# Patient Record
Sex: Male | Born: 1950
Health system: Southern US, Community
[De-identification: ages and names within clinical notes are randomized; demographics above are authoritative.]

## PROBLEM LIST (undated history)

## (undated) DIAGNOSIS — C50919 Malignant neoplasm of unspecified site of unspecified female breast: Secondary | ICD-10-CM

## (undated) DIAGNOSIS — J930 Spontaneous tension pneumothorax: Secondary | ICD-10-CM

## (undated) DIAGNOSIS — C169 Malignant neoplasm of stomach, unspecified: Secondary | ICD-10-CM

## (undated) DIAGNOSIS — C801 Malignant (primary) neoplasm, unspecified: Secondary | ICD-10-CM

## (undated) DIAGNOSIS — E538 Deficiency of other specified B group vitamins: Secondary | ICD-10-CM

## (undated) DIAGNOSIS — H269 Unspecified cataract: Secondary | ICD-10-CM

## (undated) DIAGNOSIS — F32A Depression, unspecified: Secondary | ICD-10-CM

## (undated) DIAGNOSIS — F329 Major depressive disorder, single episode, unspecified: Secondary | ICD-10-CM

## (undated) DIAGNOSIS — D649 Anemia, unspecified: Secondary | ICD-10-CM

## (undated) HISTORY — DX: Deficiency of other specified B group vitamins: E53.8

## (undated) HISTORY — DX: Unspecified cataract: H26.9

## (undated) HISTORY — DX: Malignant neoplasm of unspecified site of unspecified female breast: C50.919

## (undated) HISTORY — DX: Malignant (primary) neoplasm, unspecified: C80.1

## (undated) HISTORY — DX: Depression, unspecified: F32.A

## (undated) HISTORY — DX: Spontaneous tension pneumothorax: J93.0

## (undated) HISTORY — DX: Malignant neoplasm of stomach, unspecified: C16.9

## (undated) HISTORY — DX: Anemia, unspecified: D64.9

## (undated) HISTORY — DX: Major depressive disorder, single episode, unspecified: F32.9

---

## 1990-10-22 DIAGNOSIS — J930 Spontaneous tension pneumothorax: Secondary | ICD-10-CM

## 1990-10-22 HISTORY — DX: Spontaneous tension pneumothorax: J93.0

## 2000-08-19 ENCOUNTER — Emergency Department (HOSPITAL_COMMUNITY): Admission: EM | Admit: 2000-08-19 | Discharge: 2000-08-19 | Payer: Self-pay | Admitting: Emergency Medicine

## 2000-08-19 ENCOUNTER — Encounter: Payer: Self-pay | Admitting: Emergency Medicine

## 2004-12-22 ENCOUNTER — Ambulatory Visit: Payer: Self-pay | Admitting: Family Medicine

## 2005-11-20 ENCOUNTER — Ambulatory Visit: Payer: Self-pay | Admitting: Family Medicine

## 2005-12-28 ENCOUNTER — Inpatient Hospital Stay (HOSPITAL_COMMUNITY): Admission: AD | Admit: 2005-12-28 | Discharge: 2006-01-07 | Payer: Self-pay | Admitting: Family Medicine

## 2005-12-28 ENCOUNTER — Ambulatory Visit: Payer: Self-pay | Admitting: Family Medicine

## 2005-12-28 ENCOUNTER — Ambulatory Visit: Payer: Self-pay | Admitting: Internal Medicine

## 2005-12-31 ENCOUNTER — Encounter (INDEPENDENT_AMBULATORY_CARE_PROVIDER_SITE_OTHER): Payer: Self-pay | Admitting: Specialist

## 2005-12-31 DIAGNOSIS — C169 Malignant neoplasm of stomach, unspecified: Secondary | ICD-10-CM

## 2005-12-31 HISTORY — PX: CHOLECYSTECTOMY: SHX55

## 2005-12-31 HISTORY — PX: BILROTH II PROCEDURE: SHX1232

## 2005-12-31 HISTORY — DX: Malignant neoplasm of stomach, unspecified: C16.9

## 2006-01-04 ENCOUNTER — Ambulatory Visit: Payer: Self-pay | Admitting: Oncology

## 2006-01-15 ENCOUNTER — Ambulatory Visit: Payer: Self-pay | Admitting: Family Medicine

## 2006-02-12 LAB — CBC WITH DIFFERENTIAL/PLATELET
BASO%: 1 % (ref 0.0–2.0)
Basophils Absolute: 0 10*3/uL (ref 0.0–0.1)
EOS%: 3.8 % (ref 0.0–7.0)
Eosinophils Absolute: 0.2 10*3/uL (ref 0.0–0.5)
HCT: 35.6 % — ABNORMAL LOW (ref 38.7–49.9)
HGB: 11.8 g/dL — ABNORMAL LOW (ref 13.0–17.1)
LYMPH%: 34.3 % (ref 14.0–48.0)
MCH: 26.8 pg — ABNORMAL LOW (ref 28.0–33.4)
MCHC: 33.3 g/dL (ref 32.0–35.9)
MCV: 80.7 fL — ABNORMAL LOW (ref 81.6–98.0)
MONO#: 0.5 10*3/uL (ref 0.1–0.9)
MONO%: 11 % (ref 0.0–13.0)
NEUT#: 2.2 10*3/uL (ref 1.5–6.5)
NEUT%: 49.9 % (ref 40.0–75.0)
Platelets: 238 10*3/uL (ref 145–400)
RBC: 4.41 10*6/uL (ref 4.20–5.71)
RDW: 14.9 % — ABNORMAL HIGH (ref 11.2–14.6)
WBC: 4.4 10*3/uL (ref 4.0–10.0)
lymph#: 1.5 10*3/uL (ref 0.9–3.3)

## 2006-02-12 LAB — COMPREHENSIVE METABOLIC PANEL
ALT: 10 U/L (ref 0–40)
AST: 15 U/L (ref 0–37)
Albumin: 4.5 g/dL (ref 3.5–5.2)
Alkaline Phosphatase: 19 U/L — ABNORMAL LOW (ref 39–117)
BUN: 13 mg/dL (ref 6–23)
CO2: 26 mEq/L (ref 19–32)
Calcium: 9.2 mg/dL (ref 8.4–10.5)
Chloride: 104 mEq/L (ref 96–112)
Creatinine, Ser: 0.9 mg/dL (ref 0.4–1.5)
Glucose, Bld: 104 mg/dL — ABNORMAL HIGH (ref 70–99)
Potassium: 3.9 mEq/L (ref 3.5–5.3)
Sodium: 140 mEq/L (ref 135–145)
Total Bilirubin: 1.6 mg/dL — ABNORMAL HIGH (ref 0.3–1.2)
Total Protein: 7.3 g/dL (ref 6.0–8.3)

## 2006-02-12 LAB — IRON AND TIBC
Iron: 62 ug/dL (ref 42–165)
UIBC: 326 ug/dL

## 2006-02-12 LAB — FERRITIN: Ferritin: 21 ng/mL — ABNORMAL LOW (ref 22–322)

## 2006-03-04 ENCOUNTER — Ambulatory Visit: Payer: Self-pay | Admitting: Family Medicine

## 2006-03-19 ENCOUNTER — Ambulatory Visit: Payer: Self-pay | Admitting: Oncology

## 2006-03-25 LAB — CBC WITH DIFFERENTIAL/PLATELET
BASO%: 0.9 % (ref 0.0–2.0)
EOS%: 2.3 % (ref 0.0–7.0)
LYMPH%: 45.8 % (ref 14.0–48.0)
MCHC: 32.9 g/dL (ref 32.0–35.9)
MONO#: 0.4 10*3/uL (ref 0.1–0.9)
Platelets: 212 10*3/uL (ref 145–400)
RBC: 4.49 10*6/uL (ref 4.20–5.71)
WBC: 4.1 10*3/uL (ref 4.0–10.0)
lymph#: 1.9 10*3/uL (ref 0.9–3.3)

## 2006-03-25 LAB — COMPREHENSIVE METABOLIC PANEL
AST: 19 U/L (ref 0–37)
Albumin: 4.3 g/dL (ref 3.5–5.2)
Alkaline Phosphatase: 18 U/L — ABNORMAL LOW (ref 39–117)
BUN: 10 mg/dL (ref 6–23)
Calcium: 9.3 mg/dL (ref 8.4–10.5)
Chloride: 106 mEq/L (ref 96–112)
Glucose, Bld: 96 mg/dL (ref 70–99)
Potassium: 4.3 mEq/L (ref 3.5–5.3)
Sodium: 143 mEq/L (ref 135–145)
Total Protein: 6.8 g/dL (ref 6.0–8.3)

## 2006-04-08 LAB — COMPREHENSIVE METABOLIC PANEL
ALT: 11 U/L (ref 0–40)
AST: 20 U/L (ref 0–37)
Albumin: 3.9 g/dL (ref 3.5–5.2)
Alkaline Phosphatase: 18 U/L — ABNORMAL LOW (ref 39–117)
Calcium: 8.7 mg/dL (ref 8.4–10.5)
Chloride: 103 mEq/L (ref 96–112)
Potassium: 3.7 mEq/L (ref 3.5–5.3)
Sodium: 141 mEq/L (ref 135–145)
Total Protein: 6.1 g/dL (ref 6.0–8.3)

## 2006-04-08 LAB — CBC WITH DIFFERENTIAL/PLATELET
Basophils Absolute: 0 10*3/uL (ref 0.0–0.1)
EOS%: 1.5 % (ref 0.0–7.0)
HGB: 11.3 g/dL — ABNORMAL LOW (ref 13.0–17.1)
MCH: 26.2 pg — ABNORMAL LOW (ref 28.0–33.4)
MCV: 78.8 fL — ABNORMAL LOW (ref 81.6–98.0)
MONO%: 8.7 % (ref 0.0–13.0)
NEUT#: 1.4 10*3/uL — ABNORMAL LOW (ref 1.5–6.5)
RBC: 4.31 10*6/uL (ref 4.20–5.71)
RDW: 17 % — ABNORMAL HIGH (ref 11.2–14.6)
lymph#: 1.8 10*3/uL (ref 0.9–3.3)

## 2006-05-06 ENCOUNTER — Ambulatory Visit (HOSPITAL_COMMUNITY): Admission: RE | Admit: 2006-05-06 | Discharge: 2006-05-06 | Payer: Self-pay | Admitting: Oncology

## 2006-05-08 ENCOUNTER — Ambulatory Visit: Payer: Self-pay | Admitting: Oncology

## 2006-05-08 LAB — CBC WITH DIFFERENTIAL/PLATELET
BASO%: 0.8 % (ref 0.0–2.0)
MCHC: 34 g/dL (ref 32.0–35.9)
MONO#: 0.4 10*3/uL (ref 0.1–0.9)
RBC: 4.03 10*6/uL — ABNORMAL LOW (ref 4.20–5.71)
WBC: 3.9 10*3/uL — ABNORMAL LOW (ref 4.0–10.0)
lymph#: 1.7 10*3/uL (ref 0.9–3.3)

## 2006-05-08 LAB — COMPREHENSIVE METABOLIC PANEL
ALT: 24 U/L (ref 0–40)
CO2: 29 mEq/L (ref 19–32)
Calcium: 8.6 mg/dL (ref 8.4–10.5)
Chloride: 104 mEq/L (ref 96–112)
Sodium: 141 mEq/L (ref 135–145)
Total Protein: 6 g/dL (ref 6.0–8.3)

## 2006-06-10 LAB — CBC WITH DIFFERENTIAL/PLATELET
Basophils Absolute: 0 10*3/uL (ref 0.0–0.1)
EOS%: 1.8 % (ref 0.0–7.0)
Eosinophils Absolute: 0.1 10*3/uL (ref 0.0–0.5)
HGB: 11 g/dL — ABNORMAL LOW (ref 13.0–17.1)
LYMPH%: 51.8 % — ABNORMAL HIGH (ref 14.0–48.0)
MCH: 28.4 pg (ref 28.0–33.4)
MCV: 83.5 fL (ref 81.6–98.0)
MONO%: 10.8 % (ref 0.0–13.0)
NEUT#: 1.3 10*3/uL — ABNORMAL LOW (ref 1.5–6.5)
NEUT%: 34.7 % — ABNORMAL LOW (ref 40.0–75.0)
Platelets: 172 10*3/uL (ref 145–400)

## 2006-06-10 LAB — COMPREHENSIVE METABOLIC PANEL
ALT: 135 U/L — ABNORMAL HIGH (ref 0–40)
CO2: 29 mEq/L (ref 19–32)
Creatinine, Ser: 1.11 mg/dL (ref 0.40–1.50)
Glucose, Bld: 142 mg/dL — ABNORMAL HIGH (ref 70–99)
Total Bilirubin: 1.8 mg/dL — ABNORMAL HIGH (ref 0.3–1.2)

## 2006-07-04 ENCOUNTER — Ambulatory Visit: Payer: Self-pay | Admitting: Oncology

## 2006-07-12 LAB — COMPREHENSIVE METABOLIC PANEL
ALT: 994 U/L — ABNORMAL HIGH (ref 0–40)
BUN: 12 mg/dL (ref 6–23)
CO2: 31 mEq/L (ref 19–32)
Calcium: 8.6 mg/dL (ref 8.4–10.5)
Chloride: 104 mEq/L (ref 96–112)
Creatinine, Ser: 0.85 mg/dL (ref 0.40–1.50)

## 2006-07-12 LAB — CBC WITH DIFFERENTIAL/PLATELET
Basophils Absolute: 0 10*3/uL (ref 0.0–0.1)
Eosinophils Absolute: 0 10*3/uL (ref 0.0–0.5)
HCT: 32.6 % — ABNORMAL LOW (ref 38.7–49.9)
LYMPH%: 35.5 % (ref 14.0–48.0)
MONO#: 0.7 10*3/uL (ref 0.1–0.9)
NEUT#: 1.5 10*3/uL (ref 1.5–6.5)
NEUT%: 43.7 % (ref 40.0–75.0)
Platelets: 185 10*3/uL (ref 145–400)
WBC: 3.5 10*3/uL — ABNORMAL LOW (ref 4.0–10.0)

## 2006-07-16 ENCOUNTER — Ambulatory Visit (HOSPITAL_COMMUNITY): Admission: RE | Admit: 2006-07-16 | Discharge: 2006-07-16 | Payer: Self-pay | Admitting: Oncology

## 2006-07-16 LAB — COMPREHENSIVE METABOLIC PANEL
ALT: 1178 U/L — ABNORMAL HIGH (ref 0–40)
CO2: 31 mEq/L (ref 19–32)
Chloride: 102 mEq/L (ref 96–112)
Sodium: 139 mEq/L (ref 135–145)
Total Bilirubin: 2.9 mg/dL — ABNORMAL HIGH (ref 0.3–1.2)
Total Protein: 6.1 g/dL (ref 6.0–8.3)

## 2006-07-17 LAB — COMPREHENSIVE METABOLIC PANEL
Alkaline Phosphatase: 64 U/L (ref 39–117)
Creatinine, Ser: 0.92 mg/dL (ref 0.40–1.50)
Glucose, Bld: 106 mg/dL — ABNORMAL HIGH (ref 70–99)
Sodium: 138 mEq/L (ref 135–145)
Total Bilirubin: 2.7 mg/dL — ABNORMAL HIGH (ref 0.3–1.2)
Total Protein: 5.1 g/dL — ABNORMAL LOW (ref 6.0–8.3)

## 2006-07-17 LAB — PROTIME-INR
INR: 1.4 — ABNORMAL LOW (ref 2.00–3.50)
Protime: 16.8 Seconds — ABNORMAL HIGH (ref 10.6–13.4)

## 2006-07-18 LAB — HEPATITIS B SURFACE ANTIGEN: Hepatitis B Surface Ag: NEGATIVE

## 2006-07-18 LAB — HEPATITIS B CORE ANTIBODY, IGM: Hep B C IgM: NEGATIVE

## 2006-07-19 ENCOUNTER — Ambulatory Visit (HOSPITAL_COMMUNITY): Admission: RE | Admit: 2006-07-19 | Discharge: 2006-07-19 | Payer: Self-pay | Admitting: Oncology

## 2006-07-19 ENCOUNTER — Ambulatory Visit: Payer: Self-pay | Admitting: Internal Medicine

## 2006-07-19 LAB — CBC WITH DIFFERENTIAL/PLATELET
BASO%: 0.5 % (ref 0.0–2.0)
Eosinophils Absolute: 0 10*3/uL (ref 0.0–0.5)
HCT: 37 % — ABNORMAL LOW (ref 38.7–49.9)
HGB: 12.3 g/dL — ABNORMAL LOW (ref 13.0–17.1)
MCHC: 33.2 g/dL (ref 32.0–35.9)
MONO#: 0.5 10*3/uL (ref 0.1–0.9)
NEUT#: 1.9 10*3/uL (ref 1.5–6.5)
NEUT%: 49.7 % (ref 40.0–75.0)
WBC: 3.8 10*3/uL — ABNORMAL LOW (ref 4.0–10.0)
lymph#: 1.4 10*3/uL (ref 0.9–3.3)

## 2006-07-19 LAB — COMPREHENSIVE METABOLIC PANEL
ALT: 1341 U/L — ABNORMAL HIGH (ref 0–40)
CO2: 32 mEq/L (ref 19–32)
Calcium: 9.1 mg/dL (ref 8.4–10.5)
Chloride: 102 mEq/L (ref 96–112)
Creatinine, Ser: 0.95 mg/dL (ref 0.40–1.50)
Total Protein: 6 g/dL (ref 6.0–8.3)

## 2006-07-19 LAB — PROTIME-INR

## 2006-07-22 LAB — CBC WITH DIFFERENTIAL/PLATELET
Eosinophils Absolute: 0 10*3/uL (ref 0.0–0.5)
MONO#: 0.7 10*3/uL (ref 0.1–0.9)
MONO%: 18.4 % — ABNORMAL HIGH (ref 0.0–13.0)
NEUT#: 2.3 10*3/uL (ref 1.5–6.5)
RBC: 3.57 10*6/uL — ABNORMAL LOW (ref 4.20–5.71)
RDW: 20 % — ABNORMAL HIGH (ref 11.2–14.6)
WBC: 4 10*3/uL (ref 4.0–10.0)

## 2006-07-22 LAB — COMPREHENSIVE METABOLIC PANEL
ALT: 1054 U/L — ABNORMAL HIGH (ref 0–40)
AST: 740 U/L — ABNORMAL HIGH (ref 0–37)
Albumin: 3 g/dL — ABNORMAL LOW (ref 3.5–5.2)
Alkaline Phosphatase: 72 U/L (ref 39–117)
BUN: 16 mg/dL (ref 6–23)
CO2: 32 mEq/L (ref 19–32)
Calcium: 9.1 mg/dL (ref 8.4–10.5)
Chloride: 101 mEq/L (ref 96–112)
Creatinine, Ser: 0.85 mg/dL (ref 0.40–1.50)
Glucose, Bld: 122 mg/dL — ABNORMAL HIGH (ref 70–99)
Potassium: 3.1 mEq/L — ABNORMAL LOW (ref 3.5–5.3)
Sodium: 139 mEq/L (ref 135–145)
Total Bilirubin: 5.4 mg/dL — ABNORMAL HIGH (ref 0.3–1.2)
Total Protein: 5.4 g/dL — ABNORMAL LOW (ref 6.0–8.3)

## 2006-07-22 LAB — PROTHROMBIN TIME: INR: 1.5 (ref 0.0–1.5)

## 2006-08-15 LAB — CBC WITH DIFFERENTIAL/PLATELET
BASO%: 1.9 % (ref 0.0–2.0)
EOS%: 0.2 % (ref 0.0–7.0)
HCT: 32.7 % — ABNORMAL LOW (ref 38.7–49.9)
LYMPH%: 23.9 % (ref 14.0–48.0)
MCH: 31.5 pg (ref 28.0–33.4)
MCHC: 34.4 g/dL (ref 32.0–35.9)
MCV: 91.7 fL (ref 81.6–98.0)
MONO%: 5.4 % (ref 0.0–13.0)
NEUT%: 68.6 % (ref 40.0–75.0)
Platelets: 179 10*3/uL (ref 145–400)
lymph#: 0.9 10*3/uL (ref 0.9–3.3)

## 2006-08-15 LAB — COMPREHENSIVE METABOLIC PANEL
ALT: 308 U/L — ABNORMAL HIGH (ref 0–40)
AST: 217 U/L — ABNORMAL HIGH (ref 0–37)
Alkaline Phosphatase: 71 U/L (ref 39–117)
Creatinine, Ser: 0.96 mg/dL (ref 0.40–1.50)
Total Bilirubin: 3.7 mg/dL — ABNORMAL HIGH (ref 0.3–1.2)

## 2006-08-19 ENCOUNTER — Ambulatory Visit: Payer: Self-pay | Admitting: Oncology

## 2006-08-20 LAB — COMPREHENSIVE METABOLIC PANEL
AST: 195 U/L — ABNORMAL HIGH (ref 0–37)
Alkaline Phosphatase: 84 U/L (ref 39–117)
BUN: 15 mg/dL (ref 6–23)
Glucose, Bld: 112 mg/dL — ABNORMAL HIGH (ref 70–99)
Potassium: 4 mEq/L (ref 3.5–5.3)
Sodium: 146 mEq/L — ABNORMAL HIGH (ref 135–145)
Total Bilirubin: 3.2 mg/dL — ABNORMAL HIGH (ref 0.3–1.2)

## 2006-08-20 LAB — CBC WITH DIFFERENTIAL/PLATELET
Basophils Absolute: 0 10*3/uL (ref 0.0–0.1)
EOS%: 0.7 % (ref 0.0–7.0)
Eosinophils Absolute: 0 10*3/uL (ref 0.0–0.5)
HGB: 12.7 g/dL — ABNORMAL LOW (ref 13.0–17.1)
MCV: 92.2 fL (ref 81.6–98.0)
MONO%: 11 % (ref 0.0–13.0)
NEUT#: 2.9 10*3/uL (ref 1.5–6.5)
RBC: 4.13 10*6/uL — ABNORMAL LOW (ref 4.20–5.71)
RDW: 20.1 % — ABNORMAL HIGH (ref 11.2–14.6)
lymph#: 2.3 10*3/uL (ref 0.9–3.3)

## 2006-08-20 LAB — PROTIME-INR
INR: 1.3 — ABNORMAL LOW (ref 2.00–3.50)
Protime: 15.6 Seconds — ABNORMAL HIGH (ref 10.6–13.4)

## 2006-08-21 ENCOUNTER — Ambulatory Visit: Admission: RE | Admit: 2006-08-21 | Discharge: 2006-08-21 | Payer: Self-pay | Admitting: Oncology

## 2006-08-21 ENCOUNTER — Encounter: Payer: Self-pay | Admitting: Vascular Surgery

## 2006-08-22 ENCOUNTER — Ambulatory Visit (HOSPITAL_COMMUNITY): Admission: RE | Admit: 2006-08-22 | Discharge: 2006-08-22 | Payer: Self-pay | Admitting: Oncology

## 2006-08-26 ENCOUNTER — Ambulatory Visit: Payer: Self-pay | Admitting: Internal Medicine

## 2006-09-30 ENCOUNTER — Ambulatory Visit: Payer: Self-pay | Admitting: Oncology

## 2006-09-30 LAB — COMPREHENSIVE METABOLIC PANEL
AST: 48 U/L — ABNORMAL HIGH (ref 0–37)
Albumin: 3.2 g/dL — ABNORMAL LOW (ref 3.5–5.2)
BUN: 12 mg/dL (ref 6–23)
Calcium: 8.5 mg/dL (ref 8.4–10.5)
Chloride: 106 mEq/L (ref 96–112)
Glucose, Bld: 162 mg/dL — ABNORMAL HIGH (ref 70–99)
Potassium: 3.8 mEq/L (ref 3.5–5.3)
Total Protein: 5.8 g/dL — ABNORMAL LOW (ref 6.0–8.3)

## 2006-09-30 LAB — CBC WITH DIFFERENTIAL/PLATELET
Basophils Absolute: 0.1 10*3/uL (ref 0.0–0.1)
Eosinophils Absolute: 0.2 10*3/uL (ref 0.0–0.5)
HGB: 11.2 g/dL — ABNORMAL LOW (ref 13.0–17.1)
NEUT#: 3.9 10*3/uL (ref 1.5–6.5)
RDW: 13.9 % (ref 11.2–14.6)
WBC: 7.2 10*3/uL (ref 4.0–10.0)
lymph#: 2.2 10*3/uL (ref 0.9–3.3)

## 2006-10-22 DIAGNOSIS — D649 Anemia, unspecified: Secondary | ICD-10-CM

## 2006-10-22 HISTORY — DX: Anemia, unspecified: D64.9

## 2006-11-12 LAB — COMPREHENSIVE METABOLIC PANEL
ALT: 20 U/L (ref 0–53)
AST: 20 U/L (ref 0–37)
Alkaline Phosphatase: 38 U/L — ABNORMAL LOW (ref 39–117)
Chloride: 106 mEq/L (ref 96–112)
Creatinine, Ser: 0.74 mg/dL (ref 0.40–1.50)
Total Bilirubin: 0.7 mg/dL (ref 0.3–1.2)

## 2006-11-12 LAB — CBC WITH DIFFERENTIAL/PLATELET
BASO%: 0.6 % (ref 0.0–2.0)
EOS%: 2.6 % (ref 0.0–7.0)
HCT: 34.1 % — ABNORMAL LOW (ref 38.7–49.9)
LYMPH%: 35.3 % (ref 14.0–48.0)
MCH: 29.1 pg (ref 28.0–33.4)
MCHC: 34 g/dL (ref 32.0–35.9)
MONO%: 12.1 % (ref 0.0–13.0)
NEUT%: 49.4 % (ref 40.0–75.0)
lymph#: 1.6 10*3/uL (ref 0.9–3.3)

## 2007-02-05 DIAGNOSIS — Z87891 Personal history of nicotine dependence: Secondary | ICD-10-CM | POA: Insufficient documentation

## 2007-02-05 DIAGNOSIS — K319 Disease of stomach and duodenum, unspecified: Secondary | ICD-10-CM | POA: Insufficient documentation

## 2007-02-05 DIAGNOSIS — K469 Unspecified abdominal hernia without obstruction or gangrene: Secondary | ICD-10-CM | POA: Insufficient documentation

## 2007-02-19 ENCOUNTER — Ambulatory Visit: Payer: Self-pay | Admitting: Family Medicine

## 2007-02-21 ENCOUNTER — Ambulatory Visit: Payer: Self-pay | Admitting: Oncology

## 2007-02-25 ENCOUNTER — Encounter: Payer: Self-pay | Admitting: Family Medicine

## 2007-02-25 ENCOUNTER — Ambulatory Visit (HOSPITAL_COMMUNITY): Admission: RE | Admit: 2007-02-25 | Discharge: 2007-02-25 | Payer: Self-pay | Admitting: Oncology

## 2007-02-25 LAB — COMPREHENSIVE METABOLIC PANEL
ALT: 22 U/L (ref 0–53)
AST: 31 U/L (ref 0–37)
BUN: 6 mg/dL (ref 6–23)
CO2: 31 mEq/L (ref 19–32)
Creatinine, Ser: 0.78 mg/dL (ref 0.40–1.50)
Total Bilirubin: 1.2 mg/dL (ref 0.3–1.2)

## 2007-02-25 LAB — CBC WITH DIFFERENTIAL/PLATELET
BASO%: 0.9 % (ref 0.0–2.0)
EOS%: 1.7 % (ref 0.0–7.0)
HCT: 33.2 % — ABNORMAL LOW (ref 38.7–49.9)
LYMPH%: 40.2 % (ref 14.0–48.0)
MCH: 25.7 pg — ABNORMAL LOW (ref 28.0–33.4)
MCHC: 33.3 g/dL (ref 32.0–35.9)
MCV: 77.1 fL — ABNORMAL LOW (ref 81.6–98.0)
NEUT%: 44.9 % (ref 40.0–75.0)
Platelets: 222 10*3/uL (ref 145–400)

## 2007-04-02 ENCOUNTER — Encounter: Payer: Self-pay | Admitting: Family Medicine

## 2007-04-21 ENCOUNTER — Ambulatory Visit (HOSPITAL_COMMUNITY): Admission: RE | Admit: 2007-04-21 | Discharge: 2007-04-21 | Payer: Self-pay | Admitting: Oncology

## 2007-06-05 ENCOUNTER — Ambulatory Visit: Payer: Self-pay | Admitting: Oncology

## 2007-06-09 LAB — CBC WITH DIFFERENTIAL/PLATELET
Basophils Absolute: 0.1 10*3/uL (ref 0.0–0.1)
Eosinophils Absolute: 0 10*3/uL (ref 0.0–0.5)
HGB: 10.1 g/dL — ABNORMAL LOW (ref 13.0–17.1)
LYMPH%: 37 % (ref 14.0–48.0)
MCV: 75 fL — ABNORMAL LOW (ref 81.6–98.0)
MONO%: 9.9 % (ref 0.0–13.0)
NEUT#: 2 10*3/uL (ref 1.5–6.5)
NEUT%: 50.5 % (ref 40.0–75.0)
Platelets: 234 10*3/uL (ref 145–400)
RBC: 4.02 10*6/uL — ABNORMAL LOW (ref 4.20–5.71)

## 2007-06-09 LAB — URINALYSIS, MICROSCOPIC - CHCC
Blood: NEGATIVE
Glucose: NEGATIVE g/dL
Leukocyte Esterase: NEGATIVE
Nitrite: NEGATIVE
Protein: NEGATIVE mg/dL
WBC, UA: NEGATIVE (ref 0–2)

## 2007-06-09 LAB — COMPREHENSIVE METABOLIC PANEL
Alkaline Phosphatase: 33 U/L — ABNORMAL LOW (ref 39–117)
BUN: 12 mg/dL (ref 6–23)
Glucose, Bld: 120 mg/dL — ABNORMAL HIGH (ref 70–99)
Total Bilirubin: 0.9 mg/dL (ref 0.3–1.2)

## 2007-06-10 LAB — IRON AND TIBC
TIBC: 453 ug/dL — ABNORMAL HIGH (ref 215–435)
UIBC: 402 ug/dL

## 2007-06-10 LAB — FERRITIN: Ferritin: 7 ng/mL — ABNORMAL LOW (ref 22–322)

## 2007-07-04 ENCOUNTER — Ambulatory Visit: Payer: Self-pay | Admitting: Internal Medicine

## 2007-07-11 ENCOUNTER — Encounter: Admission: RE | Admit: 2007-07-11 | Discharge: 2007-07-11 | Payer: Self-pay | Admitting: Urology

## 2007-07-21 ENCOUNTER — Ambulatory Visit: Payer: Self-pay | Admitting: Internal Medicine

## 2007-07-21 ENCOUNTER — Encounter: Payer: Self-pay | Admitting: Family Medicine

## 2007-08-23 DIAGNOSIS — C801 Malignant (primary) neoplasm, unspecified: Secondary | ICD-10-CM

## 2007-08-23 HISTORY — DX: Malignant (primary) neoplasm, unspecified: C80.1

## 2007-08-23 HISTORY — PX: PARTIAL NEPHRECTOMY: SHX414

## 2007-09-01 ENCOUNTER — Encounter: Payer: Self-pay | Admitting: Urology

## 2007-09-01 ENCOUNTER — Ambulatory Visit: Payer: Self-pay | Admitting: Oncology

## 2007-09-01 ENCOUNTER — Inpatient Hospital Stay (HOSPITAL_COMMUNITY): Admission: RE | Admit: 2007-09-01 | Discharge: 2007-09-03 | Payer: Self-pay | Admitting: Urology

## 2007-09-11 ENCOUNTER — Encounter: Payer: Self-pay | Admitting: Family Medicine

## 2007-09-26 ENCOUNTER — Encounter: Payer: Self-pay | Admitting: Family Medicine

## 2007-10-03 ENCOUNTER — Encounter: Payer: Self-pay | Admitting: Family Medicine

## 2007-10-03 LAB — CBC WITH DIFFERENTIAL/PLATELET
BASO%: 0.8 % (ref 0.0–2.0)
EOS%: 6.7 % (ref 0.0–7.0)
HCT: 31.6 % — ABNORMAL LOW (ref 38.7–49.9)
LYMPH%: 40 % (ref 14.0–48.0)
MCH: 25.4 pg — ABNORMAL LOW (ref 28.0–33.4)
MCHC: 33.3 g/dL (ref 32.0–35.9)
MCV: 76.2 fL — ABNORMAL LOW (ref 81.6–98.0)
MONO#: 0.5 10*3/uL (ref 0.1–0.9)
MONO%: 10.3 % (ref 0.0–13.0)
NEUT%: 42.2 % (ref 40.0–75.0)
Platelets: 183 10*3/uL (ref 145–400)
RBC: 4.16 10*6/uL — ABNORMAL LOW (ref 4.20–5.71)
WBC: 4.9 10*3/uL (ref 4.0–10.0)

## 2007-10-03 LAB — COMPREHENSIVE METABOLIC PANEL
ALT: 15 U/L (ref 0–53)
AST: 19 U/L (ref 0–37)
Alkaline Phosphatase: 32 U/L — ABNORMAL LOW (ref 39–117)
CO2: 29 mEq/L (ref 19–32)
Creatinine, Ser: 0.99 mg/dL (ref 0.40–1.50)
Sodium: 144 mEq/L (ref 135–145)
Total Bilirubin: 0.8 mg/dL (ref 0.3–1.2)
Total Protein: 7 g/dL (ref 6.0–8.3)

## 2007-10-03 LAB — FERRITIN: Ferritin: 21 ng/mL — ABNORMAL LOW (ref 22–322)

## 2007-10-03 LAB — IRON AND TIBC
%SAT: 9 % — ABNORMAL LOW (ref 20–55)
Iron: 39 ug/dL — ABNORMAL LOW (ref 42–165)

## 2007-10-14 ENCOUNTER — Encounter: Payer: Self-pay | Admitting: Family Medicine

## 2007-10-14 DIAGNOSIS — C649 Malignant neoplasm of unspecified kidney, except renal pelvis: Secondary | ICD-10-CM | POA: Insufficient documentation

## 2007-11-07 ENCOUNTER — Ambulatory Visit: Payer: Self-pay | Admitting: Oncology

## 2007-12-03 ENCOUNTER — Encounter: Payer: Self-pay | Admitting: Family Medicine

## 2008-01-13 DIAGNOSIS — J930 Spontaneous tension pneumothorax: Secondary | ICD-10-CM

## 2008-01-13 DIAGNOSIS — Z8659 Personal history of other mental and behavioral disorders: Secondary | ICD-10-CM

## 2008-01-13 DIAGNOSIS — K648 Other hemorrhoids: Secondary | ICD-10-CM | POA: Insufficient documentation

## 2008-01-13 DIAGNOSIS — J939 Pneumothorax, unspecified: Secondary | ICD-10-CM | POA: Insufficient documentation

## 2008-01-13 DIAGNOSIS — Z862 Personal history of diseases of the blood and blood-forming organs and certain disorders involving the immune mechanism: Secondary | ICD-10-CM

## 2008-01-13 DIAGNOSIS — D649 Anemia, unspecified: Secondary | ICD-10-CM

## 2008-01-13 DIAGNOSIS — Z8639 Personal history of other endocrine, nutritional and metabolic disease: Secondary | ICD-10-CM | POA: Insufficient documentation

## 2008-01-13 DIAGNOSIS — K644 Residual hemorrhoidal skin tags: Secondary | ICD-10-CM | POA: Insufficient documentation

## 2008-01-28 ENCOUNTER — Ambulatory Visit: Payer: Self-pay | Admitting: Oncology

## 2008-02-02 ENCOUNTER — Encounter: Payer: Self-pay | Admitting: Internal Medicine

## 2008-02-02 LAB — COMPREHENSIVE METABOLIC PANEL
Albumin: 4.3 g/dL (ref 3.5–5.2)
BUN: 9 mg/dL (ref 6–23)
CO2: 27 mEq/L (ref 19–32)
Calcium: 9 mg/dL (ref 8.4–10.5)
Chloride: 103 mEq/L (ref 96–112)
Glucose, Bld: 79 mg/dL (ref 70–99)
Potassium: 3.4 mEq/L — ABNORMAL LOW (ref 3.5–5.3)

## 2008-02-02 LAB — CBC WITH DIFFERENTIAL/PLATELET
Basophils Absolute: 0 10*3/uL (ref 0.0–0.1)
Eosinophils Absolute: 0.1 10*3/uL (ref 0.0–0.5)
HGB: 13.9 g/dL (ref 13.0–17.1)
MCV: 82.9 fL (ref 81.6–98.0)
MONO%: 8.1 % (ref 0.0–13.0)
NEUT#: 2.1 10*3/uL (ref 1.5–6.5)
RDW: 19 % — ABNORMAL HIGH (ref 11.2–14.6)
lymph#: 2.1 10*3/uL (ref 0.9–3.3)

## 2008-02-16 ENCOUNTER — Telehealth (INDEPENDENT_AMBULATORY_CARE_PROVIDER_SITE_OTHER): Payer: Self-pay | Admitting: *Deleted

## 2008-04-02 ENCOUNTER — Telehealth (INDEPENDENT_AMBULATORY_CARE_PROVIDER_SITE_OTHER): Payer: Self-pay | Admitting: *Deleted

## 2008-04-08 ENCOUNTER — Ambulatory Visit: Payer: Self-pay | Admitting: Family Medicine

## 2008-05-31 ENCOUNTER — Ambulatory Visit: Payer: Self-pay | Admitting: Oncology

## 2008-07-16 ENCOUNTER — Ambulatory Visit: Payer: Self-pay | Admitting: Oncology

## 2008-07-20 ENCOUNTER — Encounter: Payer: Self-pay | Admitting: Family Medicine

## 2008-07-20 LAB — COMPREHENSIVE METABOLIC PANEL WITH GFR
ALT: 16 U/L (ref 0–53)
AST: 15 U/L (ref 0–37)
Albumin: 4.5 g/dL (ref 3.5–5.2)
Alkaline Phosphatase: 31 U/L — ABNORMAL LOW (ref 39–117)
BUN: 6 mg/dL (ref 6–23)
CO2: 29 meq/L (ref 19–32)
Calcium: 9.3 mg/dL (ref 8.4–10.5)
Chloride: 101 meq/L (ref 96–112)
Creatinine, Ser: 0.83 mg/dL (ref 0.40–1.50)
Glucose, Bld: 90 mg/dL (ref 70–99)
Potassium: 3.7 meq/L (ref 3.5–5.3)
Sodium: 142 meq/L (ref 135–145)
Total Bilirubin: 1.6 mg/dL — ABNORMAL HIGH (ref 0.3–1.2)
Total Protein: 7.3 g/dL (ref 6.0–8.3)

## 2008-07-20 LAB — IRON AND TIBC: TIBC: 375 ug/dL (ref 215–435)

## 2008-07-20 LAB — CBC WITH DIFFERENTIAL/PLATELET
BASO%: 1.4 % (ref 0.0–2.0)
Basophils Absolute: 0.1 10e3/uL (ref 0.0–0.1)
EOS%: 1.1 % (ref 0.0–7.0)
Eosinophils Absolute: 0 10e3/uL (ref 0.0–0.5)
HCT: 41.6 % (ref 38.7–49.9)
HGB: 14.4 g/dL (ref 13.0–17.1)
LYMPH%: 31.3 % (ref 14.0–48.0)
MCH: 29.4 pg (ref 28.0–33.4)
MCHC: 34.7 g/dL (ref 32.0–35.9)
MCV: 84.7 fL (ref 81.6–98.0)
MONO#: 0.4 10e3/uL (ref 0.1–0.9)
MONO%: 9.2 % (ref 0.0–13.0)
NEUT#: 2.5 10e3/uL (ref 1.5–6.5)
NEUT%: 57 % (ref 40.0–75.0)
Platelets: 194 10e3/uL (ref 145–400)
RBC: 4.91 10e6/uL (ref 4.20–5.71)
RDW: 12 % (ref 11.2–14.6)
WBC: 4.4 10e3/uL (ref 4.0–10.0)
lymph#: 1.4 10e3/uL (ref 0.9–3.3)

## 2008-07-20 LAB — FERRITIN: Ferritin: 62 ng/mL (ref 22–322)

## 2009-01-12 ENCOUNTER — Ambulatory Visit: Payer: Self-pay | Admitting: Oncology

## 2009-02-15 ENCOUNTER — Encounter: Payer: Self-pay | Admitting: Family Medicine

## 2009-02-15 LAB — CBC WITH DIFFERENTIAL/PLATELET
Basophils Absolute: 0 10*3/uL (ref 0.0–0.1)
Eosinophils Absolute: 0.1 10*3/uL (ref 0.0–0.5)
HCT: 41.3 % (ref 38.4–49.9)
HGB: 14 g/dL (ref 13.0–17.1)
LYMPH%: 32.9 % (ref 14.0–49.0)
MCHC: 33.8 g/dL (ref 32.0–36.0)
MONO#: 0.4 10*3/uL (ref 0.1–0.9)
NEUT#: 2.5 10*3/uL (ref 1.5–6.5)
NEUT%: 55.6 % (ref 39.0–75.0)
Platelets: 172 10*3/uL (ref 140–400)
WBC: 4.4 10*3/uL (ref 4.0–10.3)
lymph#: 1.5 10*3/uL (ref 0.9–3.3)

## 2009-02-15 LAB — COMPREHENSIVE METABOLIC PANEL
ALT: 19 U/L (ref 0–53)
CO2: 30 mEq/L (ref 19–32)
Calcium: 8.9 mg/dL (ref 8.4–10.5)
Chloride: 103 mEq/L (ref 96–112)
Creatinine, Ser: 0.82 mg/dL (ref 0.40–1.50)
Glucose, Bld: 143 mg/dL — ABNORMAL HIGH (ref 70–99)
Total Bilirubin: 1.1 mg/dL (ref 0.3–1.2)

## 2009-06-02 ENCOUNTER — Ambulatory Visit: Payer: Self-pay | Admitting: Family Medicine

## 2009-06-02 DIAGNOSIS — C169 Malignant neoplasm of stomach, unspecified: Secondary | ICD-10-CM | POA: Insufficient documentation

## 2009-10-17 ENCOUNTER — Ambulatory Visit: Payer: Self-pay | Admitting: Oncology

## 2009-10-17 LAB — CBC WITH DIFFERENTIAL/PLATELET
Eosinophils Absolute: 0.1 10*3/uL (ref 0.0–0.5)
LYMPH%: 40.8 % (ref 14.0–49.0)
MCV: 89.6 fL (ref 79.3–98.0)
MONO%: 8 % (ref 0.0–14.0)
NEUT#: 2.3 10*3/uL (ref 1.5–6.5)
Platelets: 199 10*3/uL (ref 140–400)
RBC: 4.71 10*6/uL (ref 4.20–5.82)

## 2009-10-17 LAB — COMPREHENSIVE METABOLIC PANEL
ALT: 18 U/L (ref 0–53)
BUN: 7 mg/dL (ref 6–23)
CO2: 33 mEq/L — ABNORMAL HIGH (ref 19–32)
Calcium: 8.7 mg/dL (ref 8.4–10.5)
Creatinine, Ser: 0.8 mg/dL (ref 0.40–1.50)
Total Bilirubin: 1.3 mg/dL — ABNORMAL HIGH (ref 0.3–1.2)

## 2009-10-18 ENCOUNTER — Ambulatory Visit (HOSPITAL_COMMUNITY): Admission: RE | Admit: 2009-10-18 | Discharge: 2009-10-18 | Payer: Self-pay | Admitting: Oncology

## 2009-10-18 ENCOUNTER — Encounter (INDEPENDENT_AMBULATORY_CARE_PROVIDER_SITE_OTHER): Payer: Self-pay | Admitting: *Deleted

## 2009-10-25 ENCOUNTER — Encounter: Payer: Self-pay | Admitting: Internal Medicine

## 2009-10-26 ENCOUNTER — Telehealth (INDEPENDENT_AMBULATORY_CARE_PROVIDER_SITE_OTHER): Payer: Self-pay | Admitting: *Deleted

## 2009-10-26 ENCOUNTER — Encounter: Payer: Self-pay | Admitting: Gastroenterology

## 2009-11-03 ENCOUNTER — Ambulatory Visit (HOSPITAL_COMMUNITY): Admission: RE | Admit: 2009-11-03 | Discharge: 2009-11-03 | Payer: Self-pay | Admitting: Gastroenterology

## 2009-11-03 ENCOUNTER — Ambulatory Visit: Payer: Self-pay | Admitting: Gastroenterology

## 2009-11-07 ENCOUNTER — Telehealth: Payer: Self-pay | Admitting: Gastroenterology

## 2009-11-11 ENCOUNTER — Ambulatory Visit (HOSPITAL_COMMUNITY): Admission: RE | Admit: 2009-11-11 | Discharge: 2009-11-11 | Payer: Self-pay | Admitting: Oncology

## 2009-12-20 HISTORY — PX: TUMOR REMOVAL: SHX12

## 2010-01-02 ENCOUNTER — Encounter: Payer: Self-pay | Admitting: Family Medicine

## 2010-01-05 ENCOUNTER — Encounter: Payer: Self-pay | Admitting: Family Medicine

## 2010-02-01 ENCOUNTER — Encounter: Payer: Self-pay | Admitting: Family Medicine

## 2010-02-13 ENCOUNTER — Ambulatory Visit: Payer: Self-pay | Admitting: Oncology

## 2010-02-14 ENCOUNTER — Encounter: Payer: Self-pay | Admitting: Internal Medicine

## 2010-02-14 LAB — COMPREHENSIVE METABOLIC PANEL
Albumin: 4.1 g/dL (ref 3.5–5.2)
BUN: 12 mg/dL (ref 6–23)
CO2: 28 mEq/L (ref 19–32)
Calcium: 9 mg/dL (ref 8.4–10.5)
Chloride: 104 mEq/L (ref 96–112)
Glucose, Bld: 111 mg/dL — ABNORMAL HIGH (ref 70–99)
Potassium: 4.3 mEq/L (ref 3.5–5.3)
Sodium: 141 mEq/L (ref 135–145)
Total Protein: 6.7 g/dL (ref 6.0–8.3)

## 2010-02-14 LAB — CBC WITH DIFFERENTIAL/PLATELET
Basophils Absolute: 0.1 10*3/uL (ref 0.0–0.1)
Eosinophils Absolute: 0.2 10*3/uL (ref 0.0–0.5)
HGB: 11.7 g/dL — ABNORMAL LOW (ref 13.0–17.1)
MCV: 87.5 fL (ref 79.3–98.0)
MONO#: 0.8 10*3/uL (ref 0.1–0.9)
MONO%: 12.8 % (ref 0.0–14.0)
NEUT#: 2.8 10*3/uL (ref 1.5–6.5)
RBC: 4.01 10*6/uL — ABNORMAL LOW (ref 4.20–5.82)
RDW: 13.2 % (ref 11.0–14.6)
WBC: 5.9 10*3/uL (ref 4.0–10.3)
lymph#: 2.1 10*3/uL (ref 0.9–3.3)

## 2010-04-19 ENCOUNTER — Ambulatory Visit: Payer: Self-pay | Admitting: Oncology

## 2010-04-19 LAB — COMPREHENSIVE METABOLIC PANEL
AST: 16 U/L (ref 0–37)
BUN: 16 mg/dL (ref 6–23)
Calcium: 8.6 mg/dL (ref 8.4–10.5)
Chloride: 109 mEq/L (ref 96–112)
Creatinine, Ser: 0.86 mg/dL (ref 0.40–1.50)
Glucose, Bld: 226 mg/dL — ABNORMAL HIGH (ref 70–99)

## 2010-04-19 LAB — CBC WITH DIFFERENTIAL/PLATELET
Basophils Absolute: 0.1 10*3/uL (ref 0.0–0.1)
EOS%: 2.4 % (ref 0.0–7.0)
Eosinophils Absolute: 0.1 10*3/uL (ref 0.0–0.5)
HCT: 31.5 % — ABNORMAL LOW (ref 38.4–49.9)
HGB: 9.9 g/dL — ABNORMAL LOW (ref 13.0–17.1)
MCH: 25.6 pg — ABNORMAL LOW (ref 27.2–33.4)
MCV: 81.4 fL (ref 79.3–98.0)
MONO%: 9.6 % (ref 0.0–14.0)
NEUT%: 53.5 % (ref 39.0–75.0)

## 2010-04-28 LAB — COMPREHENSIVE METABOLIC PANEL
ALT: 24 U/L (ref 0–53)
AST: 27 U/L (ref 0–37)
Albumin: 3.9 g/dL (ref 3.5–5.2)
Alkaline Phosphatase: 37 U/L — ABNORMAL LOW (ref 39–117)
BUN: 18 mg/dL (ref 6–23)
CO2: 23 mEq/L (ref 19–32)
Chloride: 104 mEq/L (ref 96–112)
Glucose, Bld: 154 mg/dL — ABNORMAL HIGH (ref 70–99)
Potassium: 4 mEq/L (ref 3.5–5.3)
Sodium: 140 mEq/L (ref 135–145)
Total Protein: 6.6 g/dL (ref 6.0–8.3)

## 2010-04-28 LAB — CBC WITH DIFFERENTIAL/PLATELET
BASO%: 1.2 % (ref 0.0–2.0)
Basophils Absolute: 0.1 10*3/uL (ref 0.0–0.1)
EOS%: 5.2 % (ref 0.0–7.0)
HGB: 10.7 g/dL — ABNORMAL LOW (ref 13.0–17.1)
MCH: 25.6 pg — ABNORMAL LOW (ref 27.2–33.4)
MCHC: 31.4 g/dL — ABNORMAL LOW (ref 32.0–36.0)
MCV: 81.4 fL (ref 79.3–98.0)
RBC: 4.19 10*6/uL — ABNORMAL LOW (ref 4.20–5.82)
RDW: 16.3 % — ABNORMAL HIGH (ref 11.0–14.6)
WBC: 5.7 10*3/uL (ref 4.0–10.3)

## 2010-05-05 ENCOUNTER — Encounter: Payer: Self-pay | Admitting: Family Medicine

## 2010-05-05 LAB — CBC WITH DIFFERENTIAL/PLATELET
BASO%: 0.1 % (ref 0.0–2.0)
EOS%: 1.4 % (ref 0.0–7.0)
LYMPH%: 27.4 % (ref 14.0–49.0)
MCH: 26.7 pg — ABNORMAL LOW (ref 27.2–33.4)
MCHC: 33.1 g/dL (ref 32.0–36.0)
MONO#: 1.3 10*3/uL — ABNORMAL HIGH (ref 0.1–0.9)
NEUT%: 52.6 % (ref 39.0–75.0)
RBC: 3.79 10*6/uL — ABNORMAL LOW (ref 4.20–5.82)
WBC: 7.3 10*3/uL (ref 4.0–10.3)
lymph#: 2 10*3/uL (ref 0.9–3.3)

## 2010-05-05 LAB — COMPREHENSIVE METABOLIC PANEL
ALT: 346 U/L — ABNORMAL HIGH (ref 0–53)
AST: 218 U/L — ABNORMAL HIGH (ref 0–37)
CO2: 26 mEq/L (ref 19–32)
Chloride: 105 mEq/L (ref 96–112)
Creatinine, Ser: 0.89 mg/dL (ref 0.40–1.50)
Sodium: 140 mEq/L (ref 135–145)
Total Bilirubin: 1.4 mg/dL — ABNORMAL HIGH (ref 0.3–1.2)
Total Protein: 6.5 g/dL (ref 6.0–8.3)

## 2010-05-05 LAB — FERRITIN: Ferritin: 20 ng/mL — ABNORMAL LOW (ref 22–322)

## 2010-05-05 LAB — IRON AND TIBC
%SAT: 18 % — ABNORMAL LOW (ref 20–55)
Iron: 82 ug/dL (ref 42–165)

## 2010-05-12 LAB — CBC WITH DIFFERENTIAL/PLATELET
BASO%: 0 % (ref 0.0–2.0)
Basophils Absolute: 0 10*3/uL (ref 0.0–0.1)
EOS%: 0.9 % (ref 0.0–7.0)
Eosinophils Absolute: 0.1 10*3/uL (ref 0.0–0.5)
HCT: 32.6 % — ABNORMAL LOW (ref 38.4–49.9)
HGB: 10.7 g/dL — ABNORMAL LOW (ref 13.0–17.1)
LYMPH%: 18 % (ref 14.0–49.0)
MCH: 26.6 pg — ABNORMAL LOW (ref 27.2–33.4)
MCHC: 32.9 g/dL (ref 32.0–36.0)
MCV: 80.8 fL (ref 79.3–98.0)
MONO#: 1.8 10*3/uL — ABNORMAL HIGH (ref 0.1–0.9)
MONO%: 15.1 % — ABNORMAL HIGH (ref 0.0–14.0)
NEUT#: 7.8 10*3/uL — ABNORMAL HIGH (ref 1.5–6.5)
NEUT%: 66 % (ref 39.0–75.0)
Platelets: 365 10*3/uL (ref 140–400)
RBC: 4.04 10*6/uL — ABNORMAL LOW (ref 4.20–5.82)
RDW: 18.7 % — ABNORMAL HIGH (ref 11.0–14.6)
WBC: 11.9 10*3/uL — ABNORMAL HIGH (ref 4.0–10.3)
lymph#: 2.1 10*3/uL (ref 0.9–3.3)

## 2010-05-12 LAB — COMPREHENSIVE METABOLIC PANEL WITH GFR
ALT: 614 U/L — ABNORMAL HIGH (ref 0–53)
AST: 404 U/L — ABNORMAL HIGH (ref 0–37)
Albumin: 3.6 g/dL (ref 3.5–5.2)
Alkaline Phosphatase: 45 U/L (ref 39–117)
BUN: 7 mg/dL (ref 6–23)
CO2: 28 meq/L (ref 19–32)
Calcium: 8.7 mg/dL (ref 8.4–10.5)
Chloride: 102 meq/L (ref 96–112)
Creatinine, Ser: 0.88 mg/dL (ref 0.40–1.50)
Glucose, Bld: 148 mg/dL — ABNORMAL HIGH (ref 70–99)
Potassium: 3.9 meq/L (ref 3.5–5.3)
Sodium: 138 meq/L (ref 135–145)
Total Bilirubin: 1.1 mg/dL (ref 0.3–1.2)
Total Protein: 6.4 g/dL (ref 6.0–8.3)

## 2010-05-15 LAB — COMPREHENSIVE METABOLIC PANEL
ALT: 771 U/L — ABNORMAL HIGH (ref 0–53)
AST: 555 U/L — ABNORMAL HIGH (ref 0–37)
CO2: 26 mEq/L (ref 19–32)
Calcium: 8.4 mg/dL (ref 8.4–10.5)
Chloride: 108 mEq/L (ref 96–112)
Creatinine, Ser: 0.81 mg/dL (ref 0.40–1.50)
Potassium: 3.9 mEq/L (ref 3.5–5.3)
Sodium: 140 mEq/L (ref 135–145)
Total Protein: 6.1 g/dL (ref 6.0–8.3)

## 2010-05-18 ENCOUNTER — Encounter: Payer: Self-pay | Admitting: Family Medicine

## 2010-05-18 LAB — COMPREHENSIVE METABOLIC PANEL
ALT: 1180 U/L — ABNORMAL HIGH (ref 0–53)
Alkaline Phosphatase: 56 U/L (ref 39–117)
CO2: 31 mEq/L (ref 19–32)
Creatinine, Ser: 0.97 mg/dL (ref 0.40–1.50)
Glucose, Bld: 119 mg/dL — ABNORMAL HIGH (ref 70–99)
Total Bilirubin: 1.7 mg/dL — ABNORMAL HIGH (ref 0.3–1.2)

## 2010-05-19 ENCOUNTER — Ambulatory Visit: Payer: Self-pay | Admitting: Oncology

## 2010-05-19 ENCOUNTER — Encounter: Payer: Self-pay | Admitting: Family Medicine

## 2010-05-19 LAB — CBC WITH DIFFERENTIAL/PLATELET
Eosinophils Absolute: 0 10*3/uL (ref 0.0–0.5)
HGB: 11.7 g/dL — ABNORMAL LOW (ref 13.0–17.1)
MONO#: 0.7 10*3/uL (ref 0.1–0.9)
NEUT#: 8.8 10*3/uL — ABNORMAL HIGH (ref 1.5–6.5)
RBC: 4.55 10*6/uL (ref 4.20–5.82)
RDW: 18.9 % — ABNORMAL HIGH (ref 11.0–14.6)
WBC: 10.9 10*3/uL — ABNORMAL HIGH (ref 4.0–10.3)
nRBC: 0 % (ref 0–0)

## 2010-05-19 LAB — PROTIME-INR
INR: 1.2 — ABNORMAL LOW (ref 2.00–3.50)
Protime: 14.4 Seconds — ABNORMAL HIGH (ref 10.6–13.4)

## 2010-05-19 LAB — COMPREHENSIVE METABOLIC PANEL
ALT: 1201 U/L — ABNORMAL HIGH (ref 0–53)
Albumin: 4 g/dL (ref 3.5–5.2)
CO2: 27 mEq/L (ref 19–32)
Calcium: 9.3 mg/dL (ref 8.4–10.5)
Chloride: 102 mEq/L (ref 96–112)
Potassium: 3.5 mEq/L (ref 3.5–5.3)
Sodium: 139 mEq/L (ref 135–145)
Total Protein: 7.5 g/dL (ref 6.0–8.3)

## 2010-05-24 ENCOUNTER — Encounter: Payer: Self-pay | Admitting: Family Medicine

## 2010-05-24 LAB — COMPREHENSIVE METABOLIC PANEL
ALT: 506 U/L — ABNORMAL HIGH (ref 0–53)
Alkaline Phosphatase: 46 U/L (ref 39–117)
BUN: 13 mg/dL (ref 6–23)
CO2: 32 mEq/L (ref 19–32)
Chloride: 104 mEq/L (ref 96–112)
Creatinine, Ser: 0.91 mg/dL (ref 0.40–1.50)
Glucose, Bld: 107 mg/dL — ABNORMAL HIGH (ref 70–99)
Total Bilirubin: 1.5 mg/dL — ABNORMAL HIGH (ref 0.3–1.2)

## 2010-06-12 ENCOUNTER — Encounter: Payer: Self-pay | Admitting: Family Medicine

## 2010-06-12 LAB — CBC WITH DIFFERENTIAL/PLATELET
BASO%: 0.7 % (ref 0.0–2.0)
EOS%: 3.2 % (ref 0.0–7.0)
Eosinophils Absolute: 0.2 10*3/uL (ref 0.0–0.5)
LYMPH%: 36.4 % (ref 14.0–49.0)
MCH: 27.8 pg (ref 27.2–33.4)
MCHC: 31.8 g/dL — ABNORMAL LOW (ref 32.0–36.0)
NEUT#: 2.8 10*3/uL (ref 1.5–6.5)
NEUT%: 49.5 % (ref 39.0–75.0)
Platelets: 256 10*3/uL (ref 140–400)

## 2010-06-12 LAB — COMPREHENSIVE METABOLIC PANEL
ALT: 639 U/L — ABNORMAL HIGH (ref 0–53)
Alkaline Phosphatase: 54 U/L (ref 39–117)
BUN: 8 mg/dL (ref 6–23)
CO2: 28 mEq/L (ref 19–32)
Chloride: 104 mEq/L (ref 96–112)
Glucose, Bld: 241 mg/dL — ABNORMAL HIGH (ref 70–99)
Potassium: 3.9 mEq/L (ref 3.5–5.3)
Sodium: 138 mEq/L (ref 135–145)

## 2010-06-29 ENCOUNTER — Ambulatory Visit: Payer: Self-pay | Admitting: Oncology

## 2010-07-05 ENCOUNTER — Encounter: Payer: Self-pay | Admitting: Family Medicine

## 2010-07-25 LAB — COMPREHENSIVE METABOLIC PANEL
Albumin: 3.5 g/dL (ref 3.5–5.2)
Alkaline Phosphatase: 36 U/L — ABNORMAL LOW (ref 39–117)
BUN: 7 mg/dL (ref 6–23)
CO2: 32 mEq/L (ref 19–32)
Chloride: 105 mEq/L (ref 96–112)
Creatinine, Ser: 0.8 mg/dL (ref 0.40–1.50)
Potassium: 4.2 mEq/L (ref 3.5–5.3)
Sodium: 142 mEq/L (ref 135–145)
Total Protein: 6.5 g/dL (ref 6.0–8.3)

## 2010-08-16 ENCOUNTER — Encounter: Payer: Self-pay | Admitting: Family Medicine

## 2010-08-16 ENCOUNTER — Encounter: Payer: Self-pay | Admitting: Internal Medicine

## 2010-09-25 ENCOUNTER — Ambulatory Visit: Payer: Self-pay | Admitting: Oncology

## 2010-09-26 LAB — CBC WITH DIFFERENTIAL/PLATELET
Basophils Absolute: 0.1 10*3/uL (ref 0.0–0.1)
Eosinophils Absolute: 0.1 10*3/uL (ref 0.0–0.5)
HGB: 13.3 g/dL (ref 13.0–17.1)
MONO%: 11.2 % (ref 0.0–14.0)
NEUT#: 3 10*3/uL (ref 1.5–6.5)
RBC: 4.51 10*6/uL (ref 4.20–5.82)
RDW: 14.6 % (ref 11.0–14.6)
WBC: 5.9 10*3/uL (ref 4.0–10.3)
lymph#: 2 10*3/uL (ref 0.9–3.3)

## 2010-09-26 LAB — COMPREHENSIVE METABOLIC PANEL
AST: 18 U/L (ref 0–37)
Albumin: 3.9 g/dL (ref 3.5–5.2)
Alkaline Phosphatase: 44 U/L (ref 39–117)
BUN: 10 mg/dL (ref 6–23)
Calcium: 8.9 mg/dL (ref 8.4–10.5)
Chloride: 103 mEq/L (ref 96–112)
Glucose, Bld: 200 mg/dL — ABNORMAL HIGH (ref 70–99)
Potassium: 4.2 mEq/L (ref 3.5–5.3)
Sodium: 137 mEq/L (ref 135–145)
Total Protein: 6.5 g/dL (ref 6.0–8.3)

## 2010-11-12 ENCOUNTER — Encounter: Payer: Self-pay | Admitting: Oncology

## 2010-11-21 NOTE — Letter (Signed)
Summary: Surgery/[facility]  Surgery/[facility]   Imported By: Lester Napoleon 08/24/2010 10:16:11  _____________________________________________________________________  External Attachment:    Type:   Image     Comment:   External Document

## 2010-11-21 NOTE — Letter (Signed)
[facility]   Imported By: Sherian Rein 03/16/2010 10:08:58  _____________________________________________________________________  External Attachment:    Type:   Image     Comment:   External Document

## 2010-11-21 NOTE — Letter (Signed)
[facility]   Imported By: Lanelle Bal 05/31/2010 08:55:16  _____________________________________________________________________  External Attachment:    Type:   Image     Comment:   External Document

## 2010-11-21 NOTE — Letter (Signed)
Summary: EGD Instructions  [facility]  [address] [place], [place] [address]   Phone: [phone]  Fax: [phone]       Henry [name]    [birth year]    MRN: [id number]       Procedure Day /Date:11/03/2009     Arrival Time: 1 pm     Procedure Time:2 pm    Location of Procedure:                     X [facility] ( Outpatient Registration)   PREPARATION FOR ENDOSCOPY   On 11/03/09 THE DAY OF THE PROCEDURE:  1.   No solid foods, milk or milk products are allowed after midnight the night before your procedure.  2.   Do not drink anything colored red or purple.  Avoid juices with pulp.  No orange juice.  3.  You may drink clear liquids until 10 am, which is 4 hours before your procedure.                                                                                                CLEAR LIQUIDS INCLUDE: Water Jello Ice Popsicles Tea (sugar ok, no milk/cream) Powdered fruit flavored drinks Coffee (sugar ok, no milk/cream) Gatorade Juice: apple, white grape, white cranberry  Lemonade Clear bullion, consomm, broth Carbonated beverages (any kind) Strained chicken noodle soup Hard Candy   MEDICATION INSTRUCTIONS  Unless otherwise instructed, you should take regular prescription medications with a small sip of water as early as possible the morning of your procedure.  Diabetic patients - see separate instructions.             OTHER INSTRUCTIONS  You will need a responsible adult at least 60 years of age to accompany you and drive you home.   This person must remain in the waiting room during your procedure.  Wear loose fitting clothing that is easily removed.  Leave jewelry and other valuables at home.  However, you may wish to bring a book to read or an iPod/MP3 player to listen to music as you wait for your procedure to start.  Remove all body piercing jewelry and leave at home.  Total time from sign-in until discharge is  approximately 2-3 hours.  You should go home directly after your procedure and rest.  You can resume normal activities the day after your procedure.  The day of your procedure you should not:   Drive   Make legal decisions   Operate machinery   Drink alcohol   Return to work  You will receive specific instructions about eating, activities and medications before you leave.    The above instructions have been reviewed and explained to me by   Chales Abrahams CMA (AAMA)  October 26, 2009 9:40 AM     I fully understand and can verbalize these instructions over the phone and mailed to home Date 10/26/09

## 2010-11-21 NOTE — Procedures (Signed)
Summary: Upper Endoscopy  Patient: [name] Note: All result statuses are Final unless otherwise noted.  Tests: (1) Upper Endoscopy (EGD)   EGD Upper Endoscopy       DONE     [facility]     [address] [place], [place]  [address]           ENDOSCOPY PROCEDURE REPORT           PATIENT:  Henry [name], Henry [name]  MR#:  [id number]     BIRTHDATE:  May 11, 1951, 58 yrs. old  GENDER:  male     ENDOSCOPIST:  Rachael Fee, MD     Referred by:  Wilhemina Bonito. Eda Keys., M.D.     PROCEDURE DATE:  11/03/2009     PROCEDURE:  Upper endoscopic ultrasound with FNA     ASA CLASS:  Class II     INDICATIONS:  perigastric mass; had small RCC resected 2008 and     5cm gastric GIST resected 2008 (subtotal gastrectomy and then     adjuvant Glevac for 6 months)     MEDICATIONS:  Fentanyl 100 mcg IV, Versed 10 mg IV     TOPICAL ANESTHETIC:  none     DESCRIPTION OF PROCEDURE:   After the risks benefits and     alternatives of the procedure were thoroughly explained, informed     consent was obtained.     <<PROCEDUREIMAGES>>     Endoscopic findings (limited views given anatomy, echoendoscope     limitations):     1. Small to medium sized gastric remnant with Bilroth II anatomy.     No clearly abnormal mucosa within stomach.     2. Normal esophagus.     3. Limited views of afferent and efferent Bilroth II limbs were     normal.           EUS findings:     1. 5.6cm hypoechoic, heterogeneous predominantly solid perigastric     lesion with distinct outer margins.  The mass clearly involves the     gastric wall but given distorted anatomy I could not determine     exact layer of communication. The mass was sampled with 3 passes     of a 22 guage Echo Tip EUS FNA needle under color Doppler to avoid     significant blood vessels.     2. No perigastric adenopathy.     3. Limited views of pancreas, liver, spleen were all normal.           Impression:     5.6cm solid mass that is intimately  associated with gastric     remnant wall (s/p subtotal gastrectomy for large, bleeding GIST in     2007).  Preliminary cytology review shows clearly neoplastic     cells, favoring GIST however await final reading.  May need immuno     staining especially given history of renal cell carcinoma.  I have     discussed these results with Dr. Marina Goodell and Dr. Precious Reel RN.           ______________________________     Rachael Fee, MD           cc: Rodena Medin, MD           n.     Rosalie Doctor:   Rachael Fee at 11/03/2009 01:56 PM           [name], [name], [phone]  Note: An exclamation  mark (!) indicates a result that was not dispersed into the flowsheet. Document Creation Date: 11/03/2009 1:56 PM _______________________________________________________________________  (1) Order result status: Final Collection or observation date-time: 11/03/2009 13:39 Requested date-time:  Receipt date-time:  Reported date-time:  Referring Physician:   Ordering Physician: Rob Bunting [phone]) Specimen Source:  Source: Launa Grill Order Number: [id number] Lab site:

## 2010-11-21 NOTE — Letter (Signed)
[facility]   Imported By: Lanelle Bal 06/05/2010 12:53:13  _____________________________________________________________________  External Attachment:    Type:   Image     Comment:   External Document

## 2010-11-21 NOTE — Progress Notes (Signed)
Summary: Discuss Patient   Phone Note From Other Clinic   Caller: Dr. Darrold Span   [phone] Call For: Dr. Christella Hartigan Summary of Call: Dr. Darrold Span spoke with Dr. Luisa Hart concerning this patient. He has a gist and she is going to contact the patient concerning this. Would like to discuss it with you. Initial call taken by: Karna Christmas,  November 07, 2009 12:14 PM  Follow-up for Phone Call        we talked.  she is contacting Mr. Wamble about final pathology (recurrent GIST) and will be setting him back up with surgery (Dr. Luisa Hart or tertiary care depending on patient wishes) Follow-up by: Rachael Fee MD,  November 07, 2009 12:23 PM

## 2010-11-21 NOTE — Letter (Signed)
[facility]   Imported By: Lester Lacomb 12/06/2009 08:43:14  _____________________________________________________________________  External Attachment:    Type:   Image     Comment:   External Document

## 2010-11-21 NOTE — Letter (Signed)
[facility]   Imported By: Lanelle Bal 06/20/2010 09:47:16  _____________________________________________________________________  External Attachment:    Type:   Image     Comment:   External Document

## 2010-11-21 NOTE — Letter (Signed)
Summary: [facility]  [facility]   Imported By: Lanelle Bal 02/08/2010 13:10:29  _____________________________________________________________________  External Attachment:    Type:   Image     Comment:   External Document

## 2010-11-21 NOTE — Letter (Signed)
Summary: [facility]  [facility]   Imported By: Lanelle Bal 01/10/2010 11:38:08  _____________________________________________________________________  External Attachment:    Type:   Image     Comment:   External Document

## 2010-11-21 NOTE — Letter (Signed)
[facility]   Imported By: Lanelle Bal 05/31/2010 08:56:09  _____________________________________________________________________  External Attachment:    Type:   Image     Comment:   External Document

## 2010-11-21 NOTE — Letter (Signed)
Summary: [facility]  [facility]   Imported By: Lanelle Bal 07/12/2010 12:29:04  _____________________________________________________________________  External Attachment:    Type:   Image     Comment:   External Document

## 2010-11-21 NOTE — Letter (Signed)
Summary: [facility]  [facility]   Imported By: Lanelle Bal 07/24/2010 13:50:57  _____________________________________________________________________  External Attachment:    Type:   Image     Comment:   External Document

## 2010-11-21 NOTE — Letter (Signed)
Summary: [facility]  [facility]   Imported By: Lanelle Bal 01/16/2010 09:33:52  _____________________________________________________________________  External Attachment:    Type:   Image     Comment:   External Document

## 2010-11-21 NOTE — Progress Notes (Signed)
Summary: EUS   Phone Note Outgoing Call Call back at [facility] Phone [phone]   Call placed by: Chales Abrahams CMA Duncan Dull),  October 26, 2009 9:38 AM Summary of Call: pt scheduled for EUS 11/03/09  review  meds and instructions.  Mailed to home.  voice mail box unable to accept messages call back number entered. Initial call taken by: Chales Abrahams CMA Duncan Dull),  October 26, 2009 9:39 AM  Follow-up for Phone Call        no answer on phone unable to leave message, ohine number was left as a call back. Chales Abrahams CMA Duncan Dull)  October 27, 2009 10:27 AM   left message on machine to call back Chales Abrahams CMA Duncan Dull)  October 28, 2009 1:16 PM  Spoke with the pt and went over the instructions and meds pt verbalized understanding and will call with further questions Follow-up by: Chales Abrahams CMA Duncan Dull),  October 31, 2009 9:42 AM

## 2010-11-28 ENCOUNTER — Encounter: Payer: Self-pay | Admitting: Gastroenterology

## 2010-12-07 NOTE — Procedures (Signed)
Summary: Recall Assessment  Recall Assessment   Imported By: Lester Wineglass 12/01/2010 15:54:40  _____________________________________________________________________  External Attachment:    Type:   Image     Comment:   External Document

## 2011-03-06 NOTE — H&P (Signed)
Henry [name], Henry [name]             ACCOUNT NO.:  [id number]   MEDICAL RECORD NO.:  [id number]          PATIENT TYPE:  INP   LOCATION:  1423                         FACILITY:  [facility]   PHYSICIAN:  Bertram Millard. Dahlstedt, M.D.DATE OF BIRTH:  October 18, 1951   DATE OF ADMISSION:  09/01/2007  DATE OF DISCHARGE:                              HISTORY & PHYSICAL   REASON FOR ADMISSION:  Right renal mass.   BRIEF HISTORY:  This is a 60 year old male who presents at this time for  attempt at laparoscopic partial nephrectomy of a 1.2-cm right upper pole  mass; this was found this year on CT scan.  He has had, otherwise, had  unremarkable followups of the gastrointestinal stromal tumor; he had a  gastrectomy for that mass.   He has had persistence of this right renal mass.  Evaluations revealed  it unlikely to be a cyst, as it enhances with contrast.   The patient was first seen by Dr. Vonita Moss, and sent to me for  consideration of nephron-sparing procedure of the right kidney, with  possible laparoscopy.  The patient desires to have this procedure  performed.  He was offered active surveillance with radiographic means.  Due to his prior history of a gastrointestinal tumor, he would prefer to  proceed with an extirpative procedure.   MEDICAL HISTORY:  1. Significant for his gastrectomy.  2. He has had bilateral inguinal hernia repairs.  3. He is treated for depression.   MEDICATIONS:  Prozac and iron.   ALLERGIES:  There are no known drug allergies.   The patient quit cigarette smoking 18 years ago.  He has a Engineer, drilling.  He is married and has children.   REVIEW OF SYSTEMS:  He denies any recent change in bowel or bladder  habits.  No unplanned weight gain or weight loss.  No lower urinary  tract symptoms.  No chest pain.   PHYSICAL EXAMINATION:  VITAL SIGNS: As recorded in the chart.  GENERAL:  He was healthy and thin-appearing.  He is in no acute  distress.  NECK:  Supple.  No  adenopathy in supraclavicular or cervical regions.  LUNGS:  Clear.  HEART:  Normal rate and rhythm.  ABDOMEN:  Scaphoid, soft, non-distended and nontender.  No mass, no  megaly.  Bladder nonpalpable.  GU:  No CVA tenderness, no flank mass.  Phallus, testicles, cords and  epididymal structures normal.  RECTAL:  Exam per Dr. Vonita Moss in September revealed a normal sphincter  tone.  Glans felt smooth and benign without enlargement.   EKG:  Revealed sinus bradycardia without acute changes.   CHEST X-RAY:  Unremarkable.   LABORATORY DATA:  Laboratories revealed mild anemia.  Renal function was  unremarkable.   IMPRESSION:  Right renal mass, most likely renal cell carcinoma.   PLAN:  Admit for laparoscopic partial nephrectomy.      Bertram Millard. Dahlstedt, M.D.  Electronically Signed     SMD/MEDQ  D:  09/01/2007  T:  09/02/2007  Job:  [id number]   cc:   Lennis P. Darrold Span, M.D.  Fax: [phone]

## 2011-03-06 NOTE — Op Note (Signed)
Henry [name], Henry [name]             ACCOUNT NO.:  [id number]   MEDICAL RECORD NO.:  [id number]          PATIENT TYPE:  INP   LOCATION:  1423                         FACILITY:  [facility]   PHYSICIAN:  Bertram Millard. Dahlstedt, M.D.DATE OF BIRTH:  1951/06/25   DATE OF PROCEDURE:  09/01/2007  DATE OF DISCHARGE:                               OPERATIVE REPORT   PREOPERATIVE DIAGNOSIS:  Right renal mass.   POSTOPERATIVE DIAGNOSIS:  Right renal mass.   PROCEDURE:  Laparoscopic right partial nephrectomy.   SURGEON:  Dr. Marcine Matar.   FIRST ASSISTANT:  Patel.   ANESTHESIA:  General endotracheal.   COMPLICATIONS:  Probable ligation of segmental right renal artery.   ESTIMATED BLOOD LOSS:  150 mL.   SPECIMENS:  Right renal mass (permanent), margin of tumor (frozen).   BRIEF HISTORY:  A 60 year old male who presents for a partial  nephrectomy, laparoscopically performed.  He has a recent diagnosis of a  right renal mass, with 1.0 x 1.2 cm, found on CT scan. MRI confirmed  this.  The patient has desired extirpative therapy, as he has been  treated for prior gastrointestinal stromal tumor.  Risks and  complications of the procedure were discussed with the patient.  These  include but are not limited to infection, bleeding, transfusion,  possibly losing the whole right kidney, injury to surrounding organs,  open conversion, DVT, PE, among others.  He understands these and  desires to proceed.   DESCRIPTION OF PROCEDURE:  The patient was identified in the holding  area, the surgical side had marked, and he received preoperative IV  antibiotics.  He was then taken to the operating room.  General  anesthetic was administered using the endotracheal apparatus.  A Foley  catheter was placed.  He was placed on the table in the flexed position  with the right side up.  The table was broken.  All extremities were  padded appropriately.  His entire abdomen and right flank were prepped  and  draped. A 12 mm incision was made in the midline underneath the  umbilicus and carried down through fascial layers.  The peritoneal space  was accessed.  A Hasson cannula was placed.  Pneumoperitoneum was  established.  A separate right lower quadrant incision was then made,  and a 12 mm trocar was placed.  Additionally, to the right of the  midline in the subxiphoid region, a 5 mm port was placed.  Inspection of  the of the abdomen was carried out.  There was obvious scarring of the  omentum to the left side of the abdominal wall superiorly. No other  adhesions were seen significantly over the right kidney.  The right  colon was mobilized using the harmonic scalpel.  The colon was mobilized  down well below the lower pole of the kidney.  Adhesions between the  stomach and the anterior abdominal wall were then taken down with the  harmonic scalpel as well.  Despite the patient having a gastrectomy,  there was very little scarring of the duodenum which was easily  kocherized.  The lower pole of the kidney was then  easily identified, as  there was hardly any perinephric fat.  The ureter was identified.  Dissection was carried behind the ureter such that the kidney and ureter  were retracted anteriorly.  Posterior dissection was easily performed,  as there was hardly any fat.  Dissection was then carried up to the  hilar vessels.  What I thought was a gonadal vein and artery were  clipped after dissection was performed quite close to this area.  It was  doubly clipped with small Hem-o-Loks.  We traced this vessel down, and  we did make it was the gonadal.  However, after some time, the inferior  posterior aspect of the kidney was seen to be somewhat blanched.  It was  felt that perhaps 10-15% of the kidney was ischemic.  It was at this  point that we recognized this was most likely a segmental artery.  Since  the vessel had been partially divided, we did not feel we could salvage  this  artery.  We then continued the dissection anteriorly, laterally and  inferiorly, easily mobilizing the whole kidney.  Once the kidney was  mobilized at the top, we could see the mass in a partially exophytic  location at the very top. The whole kidney was then skeletonized.  At  this point, 12-1/2 grams of mannitol was administered IV by anesthesia.  We then used a bulldog to clamp the renal artery.  At this point, the  tumor was resected.  The borders of the tumor had already been scored on  the capsular surface.  I then deeply excised this tumor.  It was easily  excised, as it was just over a centimeter in size.  The base of the  tumor, after it was excised, showed only seemingly normal parenchyma.  The one place I thought I got closest to the mass was trimmed and this  was sent as a renal margin.  Pathology called back a few minutes later  and revealed that this was a negative margin.  A 4-0 Vicryl was then  used to suture the small artery that was seen pumping.  This area was  also coagulated with the argon beam.  Following this, there was little  bleeding from the surface of the tumor base.  I then placed a Surgicel  bolster and sewed over it and attached it to the kidney/tumor base with  a 2-0 Vicryl.  Before I tied the suture tight, I placed FloSeal  underneath.  This seemed to afford adequate hemostasis.  Both needles  that had been used for suturing the base of the tumor and the Surgicel  bolster were then removed.  The bulldog clamp was then removed.  Clamp  time was approximately 39 minutes. There was no bleeding from the  resected site after the bulldog was removed.  We inspected this for  approximately 5 minutes.  Extra FloSeal was placed over top of the  dissected area once it lay back in the renal fossa.  This part of the  kidney was right underneath the liver.  A 15-French round Blake drain  was then placed through a separate stab incision in the right upper  quadrant.   There was some bleeding coming from the stab wound.  This was  electrocoagulated and became hemostatic.  The drain was sutured to the  skin with 3-0 nylon.  The drain inside was looped around the upper pole  of the kidney and the lower aspect of the right lobe of the  liver.  The  specimen was then entrapped in a bag and removed and sent for permanent  section.  The trocar in the upper midline was then removed.  No bleeding  was seen underneath this.  The trocar in the right lower quadrant was  removed.  The trocar site was hemostatic.  The fascia in this area was  closed with a #0 Vicryl placed in a figure-of-eight fashion.  The  midline incision, once the Hassan cannula was removed, was closed with  interrupted sutures of #0 Vicryl.  Skin staples were then applied.  Dry  sterile dressings were placed.   The patient tolerated the procedure well.  He was taken to the PACU in  stable condition.  Estimated blood loss was 150 mL.  Sponge needle and  instrument counts were correct x2.      Bertram Millard. Dahlstedt, M.D.  Electronically Signed     SMD/MEDQ  D:  09/01/2007  T:  09/02/2007  Job:  [id number]   cc:   Lennis P. Darrold Span, M.D.  Fax: [phone]

## 2011-03-06 NOTE — Assessment & Plan Note (Signed)
[facility]                         GASTROENTEROLOGY OFFICE NOTE   NAME:Henry [name], Henry [name]                    MRN:          [id number]  DATE:07/04/2007                            DOB:          1951-01-12    REFERRING PHYSICIAN:  Lennis P. Darrold Span, M.D.   OFFICE CONSULTATION NOTE   REASON FOR CONSULTATION:  Iron-deficiency anemia.   HISTORY:  This is a 60 year old white male with a history of a  gastrointestinal stromal tumor, which was diagnosed in March of 2007  when he presented with acute GI bleeding.  He subsequently underwent  surgical resection.  Thereafter, he was treated intermittently with  Gleevec therapy.  Treatment was complicated by drug-induced hepatitis,  which has now resolved.  He is referred now regarding iron deficiency  anemia.  Recent blood work revealed a hemoglobin of 10.1 with an MCV of  75.0.  Iron saturation was low at 11%, as was his ferritin at 7.  He has  had recent imaging studies, which reveal no obvious recurrent disease.  There was questionable low-density area in the region of the  gastroesophageal junction as well as an unspecified low-density lesion  in the right kidney.  He has seen urology for this.  His GI review of  systems is quite unremarkable.  No nausea or vomiting, heartburn,  abdominal pain, melena, or hematochezia.  His appetite and weight have  been stable, though not near his baseline.  He is currently on an iron  supplement once daily when he remembers to take it.  He also has a  history of bipolar disorder, for which he takes Prozac.  The patient's  last colonoscopy was performed in August of 2003.  This was  unremarkable.   PHYSICAL EXAM:  Finds a well-appearing male in no acute distress.  His blood pressure is 106/58, heart rate is 60 and regular, weight is  131.6 pounds.  HEENT:  Sclerae anicteric, conjunctivae pale.  Oral mucosa is intact.  No adenopathy.  LUNGS:  Clear.  HEART:  Regular.  ABDOMEN:  Soft without tenderness, mass, or hernia.  Previous surgical  incision is well healed.  EXTREMITIES:  Without edema.   IMPRESSION:  1. Iron deficiency anemia.  Rule out occult gastrointestinal blood      loss.  Possible causes include anastomotic ulceration, recurrent      tumor, vascular malformations, or other unexpected pathology.  2. History of gastrointestinal stromal tumor of the stomach      complicated by acute gastrointestinal bleeding status post      resection, status post intermittent treatment with Gleevec therapy.  3. Imaging abnormality of right kidney of uncertain clinical      significance.   RECOMMENDATIONS:  1. Continue iron.  2. Colonoscopy and upper endoscopy.  The nature of the procedures as      well as the risks,      benefits, and alternatives have been reviewed.  He understood and      agreed to proceed.  3. Ongoing oncology followup with Dr. Darrold Span.     Wilhemina Bonito. Marina Goodell, MD  Electronically Signed    JNP/MedQ  DD: 07/04/2007  DT: 07/04/2007  Job #: [id number]   cc:   Lennis P. Darrold Span, M.D.  Lelon Perla, DO  Clovis Pu Cornett, M.D.

## 2011-03-09 NOTE — Op Note (Signed)
NAMEAARAN, ENBERG             ACCOUNT NO.:  [id number]   MEDICAL RECORD NO.:  [id number]          PATIENT TYPE:  INP   LOCATION:  5713                         FACILITY:  [facility]   PHYSICIAN:  Thomas A. Cornett, M.D.DATE OF BIRTH:  04-15-1951   DATE OF PROCEDURE:  12/31/2005  DATE OF DISCHARGE:                                 OPERATIVE REPORT   PREOPERATIVE DIAGNOSIS:  Bleeding gastric mass.   POSTOPERATIVE DIAGNOSES:  1.  A 5 cm bleeding gastric mass at the junction of fundus and body of      stomach.1.  2.  Preoperatively known asymptomatic cholelithiasis.   PROCEDURES:  1.  Subtotal gastrectomy with Billroth II reconstruction.  2.  Open cholecystectomy.   SURGEON:  Maisie Fus A. Cornett, M.D.   ASSISTANT:  1.  Cherylynn Ridges, M.D.  2.  Angelia Mould. Derrell Lolling, M.D.   ANESTHESIA:  General endotracheal anesthesia.   ESTIMATED BLOOD LOSS:  100 cc.   DRAINS:  None.   FINDINGS:  1.  Large gastric mass measuring roughly 5 cm, in the junction of the fundus      and body.  2.  Cholelithiasis.   BRIEF HISTORY:  The patient is a 60 year old male, admitted with an upper GI  bleed over the weekend. He was found to have a very large ulcerated mass at  the junction of his body and fundus. This was felt to be consistent with a  possible GI stromal tumor, and he was brought to the operating room for  suction. After discussion of the procedure, the patient agreed to the  procedure.  He understood the risks and benefits. He had no more bleeding  once he underwent endoscopy with clip placement of the bleeding ulcers.  Also, he had cholelithiasis noted by preoperative CT and wanted to have his  gallbladder removed in the same setting; which I felt was reasonable, given  the fact he was having a laparotomy in the same region. The risks of this  were also discussed with the patient.  He voiced understanding and agreed to  proceed.   DESCRIPTION OF PROCEDURE:  The patient was brought to the  operating room and  placed supine. After induction of general endotracheal anesthesia, the  abdomen prepped and draped in sterile fashion. A Foley catheter and  nasogastric tube were placed. After sterile prep and drape, a midline  incision was used. Dissection was carried down through his midline fascia  and his abdominal cavity was entered. I palpated both lobes of the liver,  and felt no evidence of mass lesion. The mass was in the stomach and was in  the posterior wall, and was at the junction of the body and fundus (it felt  like by the palpation and by visual examination). There was no evidence of  any lymphadenopathy. The gallbladder was palpated and found to have stones  in it as well. A Buchwalter retractor was then used and the stomach was  grabbed. I took the omentum down and entered the lesser sac to get behind  the stomach, to get my hand in and palpate the mass; again,  much higher up  than originally felt.  This was at the junction of the fundus and body, and  it was in the posterior wall and was more on the medial aspect of the  stomach. I was able to grasp this and pull this down. I took down branch of  the short gastric to mobilize the fundus better. I also took down the left  gastric epiploic, as well as the right gastric epiploic, to mobilize the  stomach. I did not feel I could wedge this out, given its large size and my  inability to get wide enough margins around this and then close the defect.  I did not feel that I could resect the body and fundus, and perform an  anastomosis with residual stomach back up to the GE junction without  compromising my margins.  I felt that a subtotal gastrectomy would give me  adequate margins to do this; we went ahead and proceeded. I took down the  right gastric epiploic and exposed the pylorus. I just distal , to the  pylorus,  and was able to create a window to the first portion of the  duodenum. A GIA-100 stapling device was used,  and this was fired just distal  to the pylorus. There was adequate closure of the duodenal stump. Next, I  then took the vessels of the stomach down close to the body. This was done  all the way until I got just beyond the tumor.  This left roughly 10-15% of  the stomach left, with the very top of the fundus in the GE junction and the  cardia that was left. I got distal this point and felt that we could fire a  staple.  We fired a GIA-100 stapler just proximal to it.  I took the  specimen to the back table, opened it, and had roughly a 1 cm margin. This  was sent for frozen section after discussion with the pathologist; it was  unclear if this was a malignancy or stromal tumor, but it did have some  changes that concerned the pathologist for a possible malignancy. There was  ulceration and degradation to the tumor; again, this was worrisome for this  being a possible malignancy. I had begun to sew a Billroth II reconstruction  within the jejunum, when I got the report. I was unsatisfied with the  proximal margin and wanted to gain more margin; and took the anastomosis  down. I took an additional margin of proximal stomach with a TA-90 stapler,  and obtained another, what appeared to be, 2-3 cm. This left him with about  10% of his stomach; but the margins were clear according to the pathologist  on both specimens. At this point, I reconstructed a Billroth-II  reconstruction with his gastric stump, using a 2-layer closure  of Vicryl  and PDS suture in a running fashion. Afferent and efferent limbs were felt  to be patent. This was pulled through a retrocolic window in the transverse  mesocolon, and brought up for anastomosis prior to this. I then secured the  limbs of the Roux-en-Y to the window in the transverse colon. At this point  in time, the anastomosis was widely patent and hemostatic. Of note, I had to  mobilize the left lobe of the liver in order to visualize this better. After this  was done, it was inspected and felt to be adequate. This NG tube was  positioned and used above the anastomosis.   Next,  the gallbladder was addressed. A Kelly clamp was used to grab the  dome, and I used cautery to dissect the gallbladder from the gallbladder  fossa -- all the way down to the cystic artery. A clip was placed in the  cystic artery and this was divided. The cystic duct was identified. A right  angle placed across this, and the gallbladder was amputated and passed off  the field. I tied off the cystic duct with a 2-0 Vicryl. Irrigation was  used; there were no signs of any bleeding from the bed, cystic artery or  cystic duct. At this point, in time, irrigation was suctioned out. I  reexamined the anastomosis; felt it to be hemostatic and intact, with no  tension whatsoever on it. At this point, irrigation was used and was  suctioned out. Hemostasis was excellent. The fascia was closed with a  running #1 PDS.  Skin staples were used to close the skin edges. All sponge, needle and  instrument counts were counted, and found to be correct at this portion of  the case. After sterile dressing was applied, the patient was awakened,  taken to recovery in satisfactory condition. All final counts of sponge,  needle and instruments were found be correct.      Thomas A. Cornett, M.D.  Electronically Signed     TAC/MEDQ  D:  12/31/2005  T:  01/01/2006  Job:  [id number]   cc:   Loreen Freud, M.D.  [address] [place]  [place] [address]

## 2011-03-09 NOTE — Discharge Summary (Signed)
NAMEHASKEL, Henry [name]             ACCOUNT NO.:  [id number]   MEDICAL RECORD NO.:  [id number]          PATIENT TYPE:  INP   LOCATION:  5713                         FACILITY:  [facility]   PHYSICIAN:  Rene Paci, M.D. LHCDATE OF BIRTH:  1951-03-03   DATE OF ADMISSION:  12/28/2005  DATE OF DISCHARGE:  01/07/2006                                 DISCHARGE SUMMARY   DISCHARGE DIAGNOSIS:  1.  Acute blood loss anemia secondary to ulcerated gastric tumor.  2.  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  3.  Postoperative ileus.   HISTORY OF PRESENT ILLNESS:  The patient is a 60 year old white male who was  admitted from the office with complaints of black, tarry stools, and who was  noted to have a hemoglobin of 10.4 which was down from 14.6 at the end of  January of this year.  He also reported weakness and was admitted for  further evaluation.   PAST MEDICAL HISTORY:  1.  Depression.  2.  Spontaneous pneumothorax with chest tube.  3.  Colonoscopy in 2003 by Dr. Marina Goodell, was normal.   HOSPITAL COURSE:  Problem 1:  Acute blood loss anemia secondary to gastric tumor.  The patient  was admitted and was initially evaluated by the GI team, Dr. Marina Goodell.  The  patient underwent an endoscopy which noted a large ulcer defect as well as  coffee grounds in the stomach.  Leiomyoma or GIST were suspected.  As a  result, a surgery consult was obtained and the patient was seen by Dr.  Abbey Chatters.  A CT scan of the abdomen and pelvis was ordered which showed a  5.1 cm gastric mass as well as cholelithiasis.  As a result of these  findings, the patient underwent a partial gastrectomy as well as  cholecystectomy.  This was performed on December 31, 2005, by Dr. Harriette Bouillon.  Pathology reveals gastrointestinal stromal tumor, intermediate  grade, with no vascular invasion and no positive lymph nodes.  The patient  has been placed on a post gastrectomy diet which he is currently  tolerating  despite postoperative ileus.  He is scheduled to follow up as an outpatient  with oncology as well as Dr. Luisa Hart from surgery.  Of note, he was noted to  have a decreased iron level with a value of 17 and has been started on p.o.  iron supplement.   DISCHARGE LABORATORY DATA:  Hemoglobin 9.4, hematocrit 27.6.   DISCHARGE MEDICATIONS:  1.  Protonix 40 mg p.o. daily.  2.  Iron 325 mg p.o. b.i.d.  3.  Vicodin 1-2 tabs p.o. q.6h. p.r.n. pain.  4.  Prozac as prior to admission.   FOLLOW UP:  The patient is instructed to follow up with Dr. Loreen Freud in  1-2 weeks and call for an appointment.  He is also instructed to follow up  with Dr. Darrold Span April 24 at 9:30 a.m. and Dr. Luisa Hart of surgery in two  weeks and is instructed to call for an appointment.      Melissa S. Peggyann Juba, NP      Rene Paci,  M.D. [facility]  Electronically Signed    MSO/MEDQ  D:  01/07/2006  T:  01/08/2006  Job:  [id number]   cc:   Thomas A. Cornett, M.D.  [address]  [place] [address]   Lennis P. Darrold Span, M.D.  Fax: [phone]   Loreen Freud, M.D.  [address] [place]  [place] [address]

## 2011-03-09 NOTE — Consult Note (Signed)
Henry [name], Henry [name] NO.:  [id number]   MEDICAL RECORD NO.:  [id number]          PATIENT TYPE:  INP   LOCATION:  5713                         FACILITY:  [facility]   PHYSICIAN:  Pierce Crane, M.D.      DATE OF BIRTH:  Jul 17, 1951   DATE OF CONSULTATION:  01/03/2006  DATE OF DISCHARGE:  01/07/2006                                   CONSULTATION   CONSULTING PHYSICIAN:  Pierce Crane, M.D.   REFERRING PHYSICIAN:  Rene Paci, M.D. [facility].   REASON FOR CONSULTATION:  GIST.   HISTORY OF PRESENT ILLNESS:  Henry [name] is a 60 year old white male  admitted, on December 28, 2005, after being found to have black tarry stools as  an outpatient and anemic with a hemoglobin of 10.  GI evaluated the patient  and proceeded with an upper endoscopy showing a 5-cm gastric mass,  suspicious for GIST, along some ulcers, repaired with surgical clips.  He  had a surgical evaluation, undergoing subtotal gastrectomy with Billroth II  reconstruction and open cholecystectomy by Dr. Lindie Spruce.  Pathology report,  case [id number] by Dr. 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  Stains are positive for CKIT, CD117, alpha smooth muscle acting,  CV34 in spindle cell component, and it was negative on MSA, desmin,  calponin, CKAE1-AE3.  We were asked to see the patient with recommendations,  after discharge.   PAST MEDICAL HISTORY:  1.  Depression.  2.  Status post spontaneous pneumothorax, 1990.  3.  Remote history of tobacco use, until 1990.   SURGERY:  1.  Status post subtotal gastrectomy, Dr. Lindie Spruce, December 31, 2005.  2.  Status post bilateral hernia repair.  3.  Status post open cholecystectomy, December 31, 2005, Dr. Lindie Spruce.   ALLERGIES:  NKDA.   CURRENT MEDICATIONS:  Protonix 40 mg IV q.12h., Dulcolax, Tylenol, Benadryl,  Narcan, Zofran, Compazine, and Phenergan p.r.n.  IV fluids running at 130  ml/hr at this  time.   REVIEW OF SYSTEMS:  Remarkable for fatigue, weight loss but unable to say  the amount.  He also has a decrease in appetite due to increased secretions  and early satiation.  No headaches.  No nausea.  No vomiting.  No edema.  No  musculoskeletal pain.  The rest of the review of systems is negative.   FAMILY HISTORY:  Mother died with breast cancer, metastatic to the lung and  bone.  His father alive with a history of hypoglycemia.   SOCIAL HISTORY:  The patient is married.  He teaches golf.  No tobacco at  this time.  No alcohol history.  He lives in [place].  His last  colonoscopy, prior to the further GI evaluations, was in 2003 by Dr. Marina Goodell,  unremarkable.   PHYSICAL EXAMINATION:  GENERAL:  This is a well developed, 60 year old,  white male in no acute distress.  Alert and oriented x3.  VITAL SIGNS:  Blood pressure 121/79, pulse 82, respirations 20, temperature  99.3, pulse oximetry 92 on room air, weight 164  pounds, height 72 inches.  HEENT:  Normocephalic atraumatic.  PERRLA.  Oral mucosa without thrush or  lesions.  He is currently with an NG tube present.  NECK:  Supple with no cervical or supraclavicular masses.  LUNGS:  Clear to auscultation bilaterally.  No axillary masses.  CARDIOVASCULAR:  Regular rate and rhythm without murmurs, rubs, or gallops.  ABDOMEN:  Soft, nontender except at the area of healing, where he has  staples which show no significant erythema or suspicious areas of infection.  Bowel sounds slightly diminished.  No palpable spleen or liver.  GU:  Deferred.  RECTAL:  Deferred.  EXTREMITIES:  No clubbing or cyanosis.  No edema.  He is wearing PAS hose  bilaterally.  SKIN:  Without lesions or bruising.  No petechiae.  NEUROLOGIC:  Nonfocal.   LABORATORY:  Hemoglobin 8.8, hematocrit 25.4, white count 102, platelets  219, MCV 86.7.  PT 14, PTT 28, INR 1.1.  Sodium 140, potassium 3.0, BUN 3,  creatinine 0.8, glucose 147.  Total bilirubin 1.1,  alkaline phosphatase 22,  AST 49, ALT 83, total protein 5.0, albumin 2.3, calcium 8.4.  Last PSA was  0.53.   RADIOLOGY STUDIES:  Show a CT of the abdomen and pelvis, from December 28, 2005,  showing a 5-cm gastric mass without extra-luminal extension or  lymphadenopathy.  He also has incidental acute cholecystitis.  No pelvic  masses.   ASSESSMENT:  This is a 60 year old white male asked to see for evaluation of  gastrointestinal stromal tumor, stage T1 B1 N0 MX, intermediate grade.  Dr.  Donnie Coffin has seen and evaluated the patient and the chart has been reviewed.   PLAN:  The patient will receive Gleevec at one point and an arrangement for  followup at the [facility] has been made, to be seen by Dr.  Darrold Span in April.  The office of the [facility], telephone number [phone], will call him for details.  Although, the patient knows to call if he  has any questions or concerns earlier if needed.      Marlowe Kays, P.A.      Pierce Crane, M.D.  Electronically Signed    SW/MEDQ  D:  01/15/2006  T:  01/15/2006  Job:  [id number]

## 2011-03-09 NOTE — Assessment & Plan Note (Signed)
[facility]                           GASTROENTEROLOGY OFFICE NOTE   NAME:Leinbach, MARRION ACCOMANDO                    MRN:          [id number]  DATE:08/26/2006                            DOB:          05/03/1951    REASON FOR CONSULTATION:  1. Weight loss.  2. Elevated liver tests.   HISTORY:  This is a 60 year old white male with a history of a proximal  gastric gastrointestinal stromal tumor diagnosed in March 2007, when he  presented with acute GI bleeding. He subsequently underwent resection.  Thereafter, was treated intermittently with Gleevec therapy. In September,  the patient was noted to have marked elevation of his hepatic transaminase  in the 1000 range as well as an elevated bilirubin and normal alkaline  phosphatase. Acute hepatitis serologies were negative. There was no evidence  of vascular insult. Most likely cause for his problem was felt to be Gleevec  induced drug hepatitis. With discontinuation of the drug, his transaminases  have improved, though slowly. He recently completed a short course of  prednisone.   Next, the patient has had significant weight loss since surgery. His  preoperative weight was about 165 pounds. His baseline weight is  approximately 145 pounds. Most recently, in Dr. Precious Reel office, he was  weighing about 119 pounds. The patient reports to me that he does have early  satiety. However, no nausea or vomiting. No abdominal pain. He did change  his diet postoperatively with less caloric intake. He also lost some weight  after vigorous traveling through [place]. However, in the past few weeks, he  has actually gained 5-7 pounds. Recent CT scan of the abdomen and chest  revealed no significant abnormalities. There was changes in the liver  consistent with fatty liver.   PAST MEDICAL HISTORY:  1. Gastrointestinal stromal tumor March 2007, status post resection.      Status post intermittent treatment with  Gleevec.  2. History of bipolar illness.   ALLERGIES:  GLEEVEC.   CURRENT MEDICATIONS:  Prozac 20 mg daily.   PHYSICAL EXAMINATION:  Thin, but otherwise well-appearing male in no acute  distress. Blood pressure is 96/60, heart rate is 78 and regular, weight is  123. 4 pounds.  HEENT: Sclera anicteric. Conjunctivae are pink. Oral mucosa is intact. No  adenopathy.  LUNGS:  Are clear.  HEART: Regular.  ABDOMEN: Soft without tenderness, mass or hernia. Midline surgical incision  is well-healed.  EXTREMITIES: Are without edema.   LABORATORY DATA:  Most recent laboratories obtained August 10, 2006, reveal  normal CBC. Prothrombin time is 15.6 seconds with an INR of 1.3. SGOT 195,  SGPT 258. Alkaline phosphatase 84, total bilirubin 3.2, albumin 3.1, protein  5.5.   IMPRESSION:  1. 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 Slowly improving off drug.  2. Transient weight loss related to number of medical issues-diet and      travel. Recently with a moderate weight gain with liberalized diet. No      evidence for obstruction or recurrent tumor.   RECOMMENDATIONS:  1. Encouraged to eat smaller meals multiple times per day to assure  adequate caloric intake.  2. Continue to have liver function tests checked with Dr. Darrold Span about      every two weeks assuming they continue to improve until complete      normalization documented.  3. Monitor weights. I would anticipate this to continue to improve.  4. GI followup p.r.n.    ______________________________  Wilhemina Bonito. Eda Keys., MD    JNP/MedQ  DD: 08/26/2006  DT: 08/26/2006  Job #: [id number]   cc:   Lennis P. Darrold Span, M.D.  Lelon Perla, DO  Clovis Pu Cornett, M.D.

## 2011-03-09 NOTE — Assessment & Plan Note (Signed)
[facility]                           GASTROENTEROLOGY OFFICE NOTE   NAME:Henry [name], Henry [name]                    MRN:          [id number]  DATE:07/19/2006                            DOB:          17-Mar-1951    PROBLEMS:  Significant liver function tests elevation.   HISTORY OF PRESENT ILLNESS:  Henry [name] is a 60 year old white male known to  Dr. Marina Goodell who is diagnosed with a gastric stromal tumor earlier this year.  He is status post subtotal gastrectomy with B2 in March 2007, and has been  on protocol with Dr. Darrold Span on Gleevec 400 mg daily since April.  He was  noted to have mild elevation in his liver function studies in August 2007,  but apparently did not meet the parameters for dose reduction.  He was then  seen by Dr. Hulda Marin on September 21.  Bilirubin had gone up to 2.3, SGOT was  712 and SGPT was 994.  The patient stopped the Gleevec at that time and had  repeat labs on September 25.  He had not been on any other new medications  other than iron supplement which he also stopped.  On September 25, his  bilirubin was up to 2.9, SGOT 984, SGPT 1178.  Follow up again on September  26 showed total bilirubin of 2.7, SGOT 895, SGPT of 1128.  Subsequent CT  scan of the chest, abdominal and pelvis were done on September 25.  There  was some minimal nodularity in the right middle lobe.  No suspicious  findings.  No evidence for local recurrence of his disk tumor.  A 3 mm low  density lesion of the posterior right hepatic lobe unchanged and some  density variability in the anterior liver, most consistent with fatty  infiltration.  There is no ascites, no adenopathy.  No Marina Goodell was consulted  per phone, and hepatitis A, B and C serologies were ordered as was pro time.  Hepatitis A, B and C have all come back negative.  His INR was 1.4.  Decision was made to proceed with MRI of the abdomen for further evaluation  of the portal system and to assess  perfusion.  In the interim, the patient  has been feeling fine.  He says he is perhaps a little bit fatigued overall,  but has not had any other symptoms.  He has had an ongoing slow weight loss  since his surgery, though, he says his appetite is good and he eats multiple  times per day.  He is not having any problems with diarrhea.  He does have  quite a bit of gas.  He has had no fever or chills.  His primary concern is  that he is to leave for a business trip for [place] for three weeks on  Monday, October 1, and we are seeing him today for clearance for that trip.   MRI of the abdomen done earlier today shows stable appearance of the liver  compared with the recent CT's, fatty change anteriorly, no suspicious liver  lesion, biliary dilation.  No hepatic vascular abnormality and stable  postoperative changes  related to the gastrectomy with no evidence of  adenopathy or local recurrence.  The patient had INR drawn earlier today  which was apparently 1.5, and he has been placed on vitamin K 5 mg b.i.d.  for five days per Dr. Darrold Span.   CURRENT MEDICATIONS:  1. Vitamin K as above.  2. No other regular medications.   ALLERGIES:  None, except probable intolerance to the GLEEVEC.   PHYSICAL EXAMINATION:  GENERAL:  Well-developed, thin, white male in no  acute distress.  VITAL SIGNS:  Weight 121.8, blood pressure 102/60, pulse 80's, anicteric.  CARDIOVASCULAR:  Regular rate and rhythm with S1, S2.  PULMONARY:  Clear to A&P.  ABDOMEN:  Soft, nontender, scaphoid.  No palpable mass or  hepatosplenomegaly.  Bowel sounds are active.   IMPRESSION:  Acute hepatotoxicity, very likely due to Gleevec with no  evidence of hepatic decompensation.   RECOMMENDATIONS:  Continue off Gleevec.  Follow up liver function studies  weekly.  Risk of traveling to [place] was discussed with the patient per Dr.  Leone Payor today, and he understands the risks and possibility of  deterioration.  He is to have labs  drawn while in [place] and have those  faxed to Dr. Darrold Span.  Case was also discussed with Dr. Darrold Span, and she is  considering a atrial of steroids.  Apparently, the literature has shown that  this may help and would certainly be reasonable.      ______________________________  Mike Gip, PA-C    ______________________________  Iva Boop, MD,FACG     AE/MedQ  DD:  07/19/2006  DT:  07/22/2006  Job #:  [id number]   cc:   Lennis P. Darrold Span, M.D.

## 2011-03-09 NOTE — H&P (Signed)
[name], [name]             ACCOUNT NO.:  [id number]   MEDICAL RECORD NO.:  [id number]          PATIENT TYPE:  INP   LOCATION:  5713                         FACILITY:  [facility]   PHYSICIAN:  Lelon Perla, M.D.  DATE OF BIRTH:  [birth year]   DATE OF ADMISSION:  12/28/2005  DATE OF DISCHARGE:                                HISTORY & PHYSICAL   ADMISSION DIAGNOSIS:  Gastrointestinal bleed.   The patient is a 60 year old white male who presented to the office with  black tarry stools. The patient was seen in an urgent care yesterday.  Hemoglobin was found to be 10.4. His last hemoglobin on January 30 was 14.6,  hematocrit 43.1. The patient presents to the office and a hemoglobin was  checked and was 9.4. Up until today the patient states he has been feeling  fine, just noticed the black tarry stools, and today he admits to feeling a  little bit weak and dizzy. He does have an appointment with GI on March 14  that was set up by the urgent care. However, the patient is feeling much  worse today.   PAST MEDICAL HISTORY:  1.  Depression.  2.  He had spontaneous pneumothorax with a chest tube in 1990.  3.  Colonoscopy was done in 2003 by Dr. Marina Goodell and was normal.   PAST SURGICAL HISTORY:  Bilateral hernias.   MEDICATIONS:  He is only taking Prozac at this time 20 mg once a day.   SOCIAL HISTORY:  He denies alcohol. He quit smoking in 1990 and is married.   PHYSICAL EXAMINATION:  VITAL SIGNS:  Orthostatics were done. Lying down, the  patient's blood pressure is 116/64, pulse is 84. Sitting, blood pressure is  112/60, pulse is 88. Standing up, blood pressure is 98/58 with a pulse of  124. Weight 166, respirations 20, he is afebrile.  GENERAL:  He is awake, alert, and oriented in no acute distress.  HEENT:  Head is normocephalic, atraumatic. Eyes:  Pupils equal and reactive  to light. Conjunctivae are pale.  NECK:  Supple. No JVD, no bruits.  HEART:  Positive S1, S2. No murmurs.  LUNGS:  Clear bilaterally. No rales, rhonchi, or wheezing.  ABDOMEN:  Soft. He does have some mid epigastric tenderness but no rebound  or guarding.  RECTAL:  He has small external hemorrhoids and heme positive black stool.   ASSESSMENT AND PLAN:  Gastrointestinal bleed and orthostatic. Will admit to  [facility] with serial H&H. The patient will be typed and crossed and will  transfuse if needed. GI was also contacted and will see the patient in the  hospital. The patient is also being given IV Nexium and will be kept n.p.o.      Lelon Perla, M.D.     Shawnie Dapper  D:  12/28/2005  T:  12/29/2005  Job:  [id number]

## 2011-03-09 NOTE — Consult Note (Signed)
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   HISTORY OF PRESENT ILLNESS:  Mr. Sitzer is a 60 year old otherwise healthy  male who presented with a two-day history of abdominal pain. He describes  this as epigastric bubbling. He also had associated malaise. He had noted  that the pain would decrease after he ate. Yesterday, he had mahogany and  somewhat melenic stools formed initially and later became darker and more  loose. He went to the urgent care because of this change in his bowel  pattern. Hemoglobin was checked and was found to be low at 10.4 and revealed  his records here on e-chart on January 30th of this year with hemoglobin of  14.6.  Because of the low hemoglobin, persistent fatigue, and weakness, he  went for follow-up with his primary care physician. His hemoglobin was found  to be 9.4 and on clinical exam he was found to have melena on rectal exam.  He was sent to the emergency room for admission. He was also evaluated by  Dr. 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 The tumor had ulcerations on it  that were not actively bleeding during the EGD, but the largest ulcer was  repaired with Endo-clips. Surgery has been asked to see the patient  regarding possible surgical resection of this tumor.   REVIEW OF SYSTEMS:  As per the history of present illness. He has had no  prior hematochezia, melena, or hematemesis other than this past two days. He  thinks he has lost some weight over the past one to two weeks, but it is  unclear how  much. He has had decreased appetite for less than a week, worse  the past two days. He has not had any cardiac symptoms such as chest pain,  shortness of breath, or tachypalpitations. No respiratory symptoms such as  cough or shortness of breath. No urinary symptoms such as burning with  urination or difficulty starting urine. He has had no constitutional  symptoms such as fevers, chills, or myalgias.   FAMILY MEDICAL HISTORY:  Father had issues related to hypoglycemia. Mother  died of problems related to cervical, lung, and breast cancer.   SOCIAL HISTORY:  He does not utilize alcohol or tobacco products. He is  married and teaches golf.   PAST MEDICAL HISTORY:  Depression.   PAST SURGICAL HISTORY:  1.  Spontaneous pneumothorax requiring chest tube placement.  2.  Bilateral herniorrhaphy.  3.  Normal colonoscopy in 2003.   ALLERGIES:  NKDA.   CURRENT MEDICATIONS:  Prozac and since admission he has been started on  Protonix IV. He has had recent trouble  with sinus pressure and was taking a  couple of aspirin.   PHYSICAL EXAMINATION:  GENERAL: This is a drowsy male patient, post sedation  from EGD, without any specific complaints.  VITAL SIGNS: Pre-EGD, the patient was afebrile. Systolic blood pressure was  in the 100s supine with a heart rate of  84. Blood pressure dropped to 98/58  with a heart rate of 124 with standing and respirations 20.  NEUROLOGIC: The patient is alert and oriented as noted. Drifts off to sleep  easily when not stimulated. Moving all extremities times four and no obvious  focal neurologic deficits.  HEENT: Head is normocephalic. Sclerae not injected.  NECK: Supple with no adenopathy.  CHEST: Bilateral lung sounds clear to auscultation. Respiratory effort is  nonlabored.  CARDIAC: S1 and S2. No murmurs, rubs, thrills, or gallops.  ABDOMEN: Soft, nontender, nondistended without hepatosplenomegaly, masses,  or bruits.  EXTREMITIES: Symmetrical in  appearance without clubbing, cyanosis, or edema.   LABORATORY DATA:  PT 14, PTT 28, INR 1.1. White count 8800, hemoglobin 9.5,  platelet count 234,000. Sodium 140, potassium 3.2, CO2 27, BUN 25,  creatinine 1.4. LFTs normal. Glucose 122.   DIAGNOSTICS:  An EGD was done today showing a 5 cm tumor, probably a myoma  in the fundus of the stomach. There were also two less than 1-cm ulcers on  the surface that were non-bleeding. CT of the abdomen and pelvis have been  ordered by GI and are pending.   IMPRESSION:  1.  Ulcerated gastric tumor, questionably leiomyoma/GIST.  2.  Gastrointestinal bleeding secondary to ulcerated tumor.  3.  Anemia secondary to gastrointestinal bleeding.  4.  Hypokalemia.   PLAN:  1.  Agree with checking CT of abdomen and pelvis with patient's history of      mass. Possible malignant process. We need to check for any enlarged      lymph nodes and other pathology.  2.  Agree with serial H&H. Vital signs are otherwise stable, although he      appeared to be somewhat orthostatic pre-admission and pre-hydration. The      patient may require transfusion of packed red blood cells. Defer this to      GI and internal medicine.  3.  Will go ahead and replete his potassium once he is fully awake to take      medication. Will repeat labs in the      morning.  4.  At this point the patient will probably require partial gastrectomy      early next week or this weekend if he rebleeds.      Allison L. Rennis Harding, N.P.      Adolph Pollack, M.D.  Electronically Signed    ALE/MEDQ  D:  12/28/2005  T:  12/29/2005  Job:  [id number]   cc:   Wilhemina Bonito. Marina Goodell, M.D. [facility]  [address]  [place]  [place] [address]

## 2011-04-23 ENCOUNTER — Other Ambulatory Visit: Payer: Self-pay | Admitting: Oncology

## 2011-04-23 ENCOUNTER — Encounter (HOSPITAL_BASED_OUTPATIENT_CLINIC_OR_DEPARTMENT_OTHER): Payer: Managed Care, Other (non HMO) | Admitting: Oncology

## 2011-04-23 DIAGNOSIS — T451X5A Adverse effect of antineoplastic and immunosuppressive drugs, initial encounter: Secondary | ICD-10-CM

## 2011-04-23 DIAGNOSIS — D518 Other vitamin B12 deficiency anemias: Secondary | ICD-10-CM

## 2011-04-23 DIAGNOSIS — R7402 Elevation of levels of lactic acid dehydrogenase (LDH): Secondary | ICD-10-CM

## 2011-04-23 DIAGNOSIS — D509 Iron deficiency anemia, unspecified: Secondary | ICD-10-CM

## 2011-04-23 DIAGNOSIS — C169 Malignant neoplasm of stomach, unspecified: Secondary | ICD-10-CM

## 2011-04-23 LAB — CBC WITH DIFFERENTIAL/PLATELET
Eosinophils Absolute: 0.1 10*3/uL (ref 0.0–0.5)
HCT: 32.8 % — ABNORMAL LOW (ref 38.4–49.9)
LYMPH%: 44.2 % (ref 14.0–49.0)
MCHC: 33 g/dL (ref 32.0–36.0)
MCV: 85.4 fL (ref 79.3–98.0)
MONO#: 0.6 10*3/uL (ref 0.1–0.9)
MONO%: 11.7 % (ref 0.0–14.0)
NEUT#: 2.1 10*3/uL (ref 1.5–6.5)
NEUT%: 41.2 % (ref 39.0–75.0)
Platelets: 327 10*3/uL (ref 140–400)
RBC: 3.84 10*6/uL — ABNORMAL LOW (ref 4.20–5.82)
WBC: 5.1 10*3/uL (ref 4.0–10.3)

## 2011-04-23 LAB — COMPREHENSIVE METABOLIC PANEL
ALT: 12 U/L (ref 0–53)
AST: 19 U/L (ref 0–37)
Albumin: 3.9 g/dL (ref 3.5–5.2)
BUN: 11 mg/dL (ref 6–23)
Calcium: 8.9 mg/dL (ref 8.4–10.5)
Sodium: 141 mEq/L (ref 135–145)

## 2011-05-14 ENCOUNTER — Encounter (HOSPITAL_BASED_OUTPATIENT_CLINIC_OR_DEPARTMENT_OTHER): Payer: Managed Care, Other (non HMO) | Admitting: Oncology

## 2011-05-14 ENCOUNTER — Other Ambulatory Visit: Payer: Self-pay | Admitting: Oncology

## 2011-05-14 DIAGNOSIS — C169 Malignant neoplasm of stomach, unspecified: Secondary | ICD-10-CM

## 2011-05-14 LAB — COMPREHENSIVE METABOLIC PANEL
ALT: 12 U/L (ref 0–53)
AST: 17 U/L (ref 0–37)
Alkaline Phosphatase: 29 U/L — ABNORMAL LOW (ref 39–117)
Creatinine, Ser: 1.01 mg/dL (ref 0.50–1.35)
Total Bilirubin: 1.2 mg/dL (ref 0.3–1.2)

## 2011-05-28 ENCOUNTER — Encounter (HOSPITAL_BASED_OUTPATIENT_CLINIC_OR_DEPARTMENT_OTHER): Payer: Managed Care, Other (non HMO) | Admitting: Oncology

## 2011-05-28 ENCOUNTER — Other Ambulatory Visit: Payer: Self-pay | Admitting: Oncology

## 2011-05-28 DIAGNOSIS — D509 Iron deficiency anemia, unspecified: Secondary | ICD-10-CM

## 2011-05-28 DIAGNOSIS — C169 Malignant neoplasm of stomach, unspecified: Secondary | ICD-10-CM

## 2011-05-28 DIAGNOSIS — D518 Other vitamin B12 deficiency anemias: Secondary | ICD-10-CM

## 2011-05-28 DIAGNOSIS — T451X5A Adverse effect of antineoplastic and immunosuppressive drugs, initial encounter: Secondary | ICD-10-CM

## 2011-05-28 LAB — CBC WITH DIFFERENTIAL/PLATELET
BASO%: 0.6 % (ref 0.0–2.0)
EOS%: 2.7 % (ref 0.0–7.0)
HCT: 33.9 % — ABNORMAL LOW (ref 38.4–49.9)
LYMPH%: 21.3 % (ref 14.0–49.0)
MCH: 27.9 pg (ref 27.2–33.4)
MCHC: 33.3 g/dL (ref 32.0–36.0)
MONO#: 1.4 10*3/uL — ABNORMAL HIGH (ref 0.1–0.9)
NEUT%: 62.1 % (ref 39.0–75.0)
Platelets: 303 10*3/uL (ref 140–400)

## 2011-05-28 LAB — COMPREHENSIVE METABOLIC PANEL
BUN: 8 mg/dL (ref 6–23)
CO2: 31 mEq/L (ref 19–32)
Calcium: 8.9 mg/dL (ref 8.4–10.5)
Chloride: 99 mEq/L (ref 96–112)
Creatinine, Ser: 0.89 mg/dL (ref 0.50–1.35)
Total Bilirubin: 0.6 mg/dL (ref 0.3–1.2)

## 2011-06-04 ENCOUNTER — Other Ambulatory Visit: Payer: Self-pay | Admitting: Oncology

## 2011-06-04 ENCOUNTER — Encounter (HOSPITAL_BASED_OUTPATIENT_CLINIC_OR_DEPARTMENT_OTHER): Payer: Managed Care, Other (non HMO) | Admitting: Oncology

## 2011-06-04 DIAGNOSIS — T451X5A Adverse effect of antineoplastic and immunosuppressive drugs, initial encounter: Secondary | ICD-10-CM

## 2011-06-04 DIAGNOSIS — D509 Iron deficiency anemia, unspecified: Secondary | ICD-10-CM

## 2011-06-04 DIAGNOSIS — C169 Malignant neoplasm of stomach, unspecified: Secondary | ICD-10-CM

## 2011-06-04 LAB — COMPREHENSIVE METABOLIC PANEL
ALT: 97 U/L — ABNORMAL HIGH (ref 0–53)
Albumin: 3.3 g/dL — ABNORMAL LOW (ref 3.5–5.2)
CO2: 31 mEq/L (ref 19–32)
Calcium: 9 mg/dL (ref 8.4–10.5)
Chloride: 103 mEq/L (ref 96–112)
Sodium: 141 mEq/L (ref 135–145)
Total Protein: 6.9 g/dL (ref 6.0–8.3)

## 2011-06-11 ENCOUNTER — Encounter (HOSPITAL_BASED_OUTPATIENT_CLINIC_OR_DEPARTMENT_OTHER): Payer: Managed Care, Other (non HMO) | Admitting: Oncology

## 2011-06-11 ENCOUNTER — Other Ambulatory Visit: Payer: Self-pay | Admitting: Oncology

## 2011-06-11 DIAGNOSIS — D509 Iron deficiency anemia, unspecified: Secondary | ICD-10-CM

## 2011-06-11 DIAGNOSIS — C169 Malignant neoplasm of stomach, unspecified: Secondary | ICD-10-CM

## 2011-06-11 DIAGNOSIS — T451X5A Adverse effect of antineoplastic and immunosuppressive drugs, initial encounter: Secondary | ICD-10-CM

## 2011-06-11 DIAGNOSIS — R7401 Elevation of levels of liver transaminase levels: Secondary | ICD-10-CM

## 2011-06-11 LAB — COMPREHENSIVE METABOLIC PANEL
ALT: 113 U/L — ABNORMAL HIGH (ref 0–53)
AST: 53 U/L — ABNORMAL HIGH (ref 0–37)
Alkaline Phosphatase: 31 U/L — ABNORMAL LOW (ref 39–117)
Glucose, Bld: 183 mg/dL — ABNORMAL HIGH (ref 70–99)
Sodium: 141 mEq/L (ref 135–145)
Total Bilirubin: 1.1 mg/dL (ref 0.3–1.2)
Total Protein: 6.5 g/dL (ref 6.0–8.3)

## 2011-06-18 ENCOUNTER — Other Ambulatory Visit: Payer: Self-pay | Admitting: Oncology

## 2011-06-18 ENCOUNTER — Encounter (HOSPITAL_BASED_OUTPATIENT_CLINIC_OR_DEPARTMENT_OTHER): Payer: Managed Care, Other (non HMO) | Admitting: Oncology

## 2011-06-18 DIAGNOSIS — D509 Iron deficiency anemia, unspecified: Secondary | ICD-10-CM

## 2011-06-18 DIAGNOSIS — T451X5A Adverse effect of antineoplastic and immunosuppressive drugs, initial encounter: Secondary | ICD-10-CM

## 2011-06-18 DIAGNOSIS — C169 Malignant neoplasm of stomach, unspecified: Secondary | ICD-10-CM

## 2011-06-18 LAB — COMPREHENSIVE METABOLIC PANEL
ALT: 166 U/L — ABNORMAL HIGH (ref 0–53)
AST: 81 U/L — ABNORMAL HIGH (ref 0–37)
Albumin: 3.2 g/dL — ABNORMAL LOW (ref 3.5–5.2)
CO2: 31 mEq/L (ref 19–32)
Calcium: 8.9 mg/dL (ref 8.4–10.5)
Chloride: 101 mEq/L (ref 96–112)
Potassium: 3.2 mEq/L — ABNORMAL LOW (ref 3.5–5.3)

## 2011-06-18 LAB — CBC WITH DIFFERENTIAL/PLATELET
BASO%: 0.4 % (ref 0.0–2.0)
Basophils Absolute: 0 10*3/uL (ref 0.0–0.1)
EOS%: 0.6 % (ref 0.0–7.0)
HCT: 32.9 % — ABNORMAL LOW (ref 38.4–49.9)
HGB: 10.7 g/dL — ABNORMAL LOW (ref 13.0–17.1)
MCH: 27 pg — ABNORMAL LOW (ref 27.2–33.4)
MONO#: 1.1 10*3/uL — ABNORMAL HIGH (ref 0.1–0.9)
RDW: 14.9 % — ABNORMAL HIGH (ref 11.0–14.6)
WBC: 9.8 10*3/uL (ref 4.0–10.3)
lymph#: 4.4 10*3/uL — ABNORMAL HIGH (ref 0.9–3.3)

## 2011-06-26 ENCOUNTER — Encounter (HOSPITAL_BASED_OUTPATIENT_CLINIC_OR_DEPARTMENT_OTHER): Payer: Managed Care, Other (non HMO) | Admitting: Oncology

## 2011-06-26 ENCOUNTER — Other Ambulatory Visit: Payer: Self-pay | Admitting: Oncology

## 2011-06-26 DIAGNOSIS — D518 Other vitamin B12 deficiency anemias: Secondary | ICD-10-CM

## 2011-06-26 DIAGNOSIS — T451X5A Adverse effect of antineoplastic and immunosuppressive drugs, initial encounter: Secondary | ICD-10-CM

## 2011-06-26 DIAGNOSIS — C169 Malignant neoplasm of stomach, unspecified: Secondary | ICD-10-CM

## 2011-06-26 DIAGNOSIS — D509 Iron deficiency anemia, unspecified: Secondary | ICD-10-CM

## 2011-06-26 LAB — COMPREHENSIVE METABOLIC PANEL
ALT: 159 U/L — ABNORMAL HIGH (ref 0–53)
AST: 73 U/L — ABNORMAL HIGH (ref 0–37)
Alkaline Phosphatase: 28 U/L — ABNORMAL LOW (ref 39–117)
CO2: 26 mEq/L (ref 19–32)
Creatinine, Ser: 0.88 mg/dL (ref 0.50–1.35)
Total Bilirubin: 1 mg/dL (ref 0.3–1.2)

## 2011-06-26 LAB — CBC WITH DIFFERENTIAL/PLATELET
BASO%: 0.7 % (ref 0.0–2.0)
EOS%: 1 % (ref 0.0–7.0)
HCT: 33.7 % — ABNORMAL LOW (ref 38.4–49.9)
LYMPH%: 36.9 % (ref 14.0–49.0)
MCH: 27 pg — ABNORMAL LOW (ref 27.2–33.4)
MCHC: 32.2 g/dL (ref 32.0–36.0)
MCV: 83.9 fL (ref 79.3–98.0)
MONO%: 8.2 % (ref 0.0–14.0)
NEUT%: 53.2 % (ref 39.0–75.0)
Platelets: 307 10*3/uL (ref 140–400)
RBC: 4.01 10*6/uL — ABNORMAL LOW (ref 4.20–5.82)

## 2011-07-03 ENCOUNTER — Encounter (HOSPITAL_BASED_OUTPATIENT_CLINIC_OR_DEPARTMENT_OTHER): Payer: Managed Care, Other (non HMO) | Admitting: Oncology

## 2011-07-03 ENCOUNTER — Other Ambulatory Visit: Payer: Self-pay | Admitting: Oncology

## 2011-07-03 DIAGNOSIS — T451X5A Adverse effect of antineoplastic and immunosuppressive drugs, initial encounter: Secondary | ICD-10-CM

## 2011-07-03 DIAGNOSIS — C169 Malignant neoplasm of stomach, unspecified: Secondary | ICD-10-CM

## 2011-07-03 DIAGNOSIS — D509 Iron deficiency anemia, unspecified: Secondary | ICD-10-CM

## 2011-07-03 LAB — CBC WITH DIFFERENTIAL/PLATELET
BASO%: 0.8 % (ref 0.0–2.0)
Eosinophils Absolute: 0.2 10*3/uL (ref 0.0–0.5)
HCT: 31.9 % — ABNORMAL LOW (ref 38.4–49.9)
LYMPH%: 38.5 % (ref 14.0–49.0)
MCHC: 32.6 g/dL (ref 32.0–36.0)
MCV: 82.6 fL (ref 79.3–98.0)
MONO#: 0.9 10*3/uL (ref 0.1–0.9)
NEUT%: 45.3 % (ref 39.0–75.0)
Platelets: 313 10*3/uL (ref 140–400)
WBC: 6.9 10*3/uL (ref 4.0–10.3)

## 2011-07-03 LAB — COMPREHENSIVE METABOLIC PANEL
CO2: 28 mEq/L (ref 19–32)
Creatinine, Ser: 0.82 mg/dL (ref 0.50–1.35)
Glucose, Bld: 120 mg/dL — ABNORMAL HIGH (ref 70–99)
Total Bilirubin: 0.6 mg/dL (ref 0.3–1.2)

## 2011-07-10 ENCOUNTER — Other Ambulatory Visit: Payer: Self-pay | Admitting: Oncology

## 2011-07-10 ENCOUNTER — Encounter (HOSPITAL_BASED_OUTPATIENT_CLINIC_OR_DEPARTMENT_OTHER): Payer: Managed Care, Other (non HMO) | Admitting: Oncology

## 2011-07-10 DIAGNOSIS — C169 Malignant neoplasm of stomach, unspecified: Secondary | ICD-10-CM

## 2011-07-10 DIAGNOSIS — D518 Other vitamin B12 deficiency anemias: Secondary | ICD-10-CM

## 2011-07-10 DIAGNOSIS — Z8659 Personal history of other mental and behavioral disorders: Secondary | ICD-10-CM

## 2011-07-10 LAB — CBC WITH DIFFERENTIAL/PLATELET
Eosinophils Absolute: 0.1 10*3/uL (ref 0.0–0.5)
HCT: 32.9 % — ABNORMAL LOW (ref 38.4–49.9)
LYMPH%: 38.6 % (ref 14.0–49.0)
MONO#: 1 10*3/uL — ABNORMAL HIGH (ref 0.1–0.9)
NEUT#: 4.9 10*3/uL (ref 1.5–6.5)
NEUT%: 49.4 % (ref 39.0–75.0)
Platelets: 328 10*3/uL (ref 140–400)
WBC: 10 10*3/uL (ref 4.0–10.3)

## 2011-07-10 LAB — COMPREHENSIVE METABOLIC PANEL
ALT: 299 U/L — ABNORMAL HIGH (ref 0–53)
AST: 150 U/L — ABNORMAL HIGH (ref 0–37)
Alkaline Phosphatase: 31 U/L — ABNORMAL LOW (ref 39–117)
BUN: 15 mg/dL (ref 6–23)
Creatinine, Ser: 0.91 mg/dL (ref 0.50–1.35)
Total Bilirubin: 1.2 mg/dL (ref 0.3–1.2)

## 2011-07-17 ENCOUNTER — Encounter (HOSPITAL_BASED_OUTPATIENT_CLINIC_OR_DEPARTMENT_OTHER): Payer: Managed Care, Other (non HMO) | Admitting: Oncology

## 2011-07-17 ENCOUNTER — Other Ambulatory Visit: Payer: Self-pay | Admitting: Oncology

## 2011-07-17 DIAGNOSIS — D509 Iron deficiency anemia, unspecified: Secondary | ICD-10-CM

## 2011-07-17 DIAGNOSIS — C169 Malignant neoplasm of stomach, unspecified: Secondary | ICD-10-CM

## 2011-07-17 DIAGNOSIS — T451X5A Adverse effect of antineoplastic and immunosuppressive drugs, initial encounter: Secondary | ICD-10-CM

## 2011-07-17 LAB — COMPREHENSIVE METABOLIC PANEL
ALT: 193 U/L — ABNORMAL HIGH (ref 0–53)
BUN: 14 mg/dL (ref 6–23)
CO2: 29 mEq/L (ref 19–32)
Calcium: 8.5 mg/dL (ref 8.4–10.5)
Chloride: 102 mEq/L (ref 96–112)
Creatinine, Ser: 0.92 mg/dL (ref 0.50–1.35)
Glucose, Bld: 174 mg/dL — ABNORMAL HIGH (ref 70–99)
Total Bilirubin: 0.6 mg/dL (ref 0.3–1.2)

## 2011-07-24 ENCOUNTER — Encounter (HOSPITAL_BASED_OUTPATIENT_CLINIC_OR_DEPARTMENT_OTHER): Payer: Managed Care, Other (non HMO) | Admitting: Oncology

## 2011-07-24 ENCOUNTER — Other Ambulatory Visit: Payer: Self-pay | Admitting: Oncology

## 2011-07-24 DIAGNOSIS — D518 Other vitamin B12 deficiency anemias: Secondary | ICD-10-CM

## 2011-07-24 DIAGNOSIS — D509 Iron deficiency anemia, unspecified: Secondary | ICD-10-CM

## 2011-07-24 DIAGNOSIS — T451X5A Adverse effect of antineoplastic and immunosuppressive drugs, initial encounter: Secondary | ICD-10-CM

## 2011-07-24 DIAGNOSIS — C169 Malignant neoplasm of stomach, unspecified: Secondary | ICD-10-CM

## 2011-07-24 LAB — CBC WITH DIFFERENTIAL/PLATELET
BASO%: 0.2 % (ref 0.0–2.0)
Eosinophils Absolute: 0.1 10*3/uL (ref 0.0–0.5)
HCT: 33.6 % — ABNORMAL LOW (ref 38.4–49.9)
MCHC: 32.3 g/dL (ref 32.0–36.0)
MONO#: 1.1 10*3/uL — ABNORMAL HIGH (ref 0.1–0.9)
NEUT#: 8.1 10*3/uL — ABNORMAL HIGH (ref 1.5–6.5)
NEUT%: 60 % (ref 39.0–75.0)
Platelets: 360 10*3/uL (ref 140–400)
WBC: 13.5 10*3/uL — ABNORMAL HIGH (ref 4.0–10.3)
lymph#: 4.2 10*3/uL — ABNORMAL HIGH (ref 0.9–3.3)

## 2011-07-24 LAB — COMPREHENSIVE METABOLIC PANEL
ALT: 147 U/L — ABNORMAL HIGH (ref 0–53)
CO2: 30 mEq/L (ref 19–32)
Calcium: 8.8 mg/dL (ref 8.4–10.5)
Chloride: 102 mEq/L (ref 96–112)
Creatinine, Ser: 0.7 mg/dL (ref 0.50–1.35)
Glucose, Bld: 221 mg/dL — ABNORMAL HIGH (ref 70–99)
Sodium: 140 mEq/L (ref 135–145)
Total Protein: 6.6 g/dL (ref 6.0–8.3)

## 2011-07-31 ENCOUNTER — Encounter (HOSPITAL_BASED_OUTPATIENT_CLINIC_OR_DEPARTMENT_OTHER): Payer: Managed Care, Other (non HMO) | Admitting: Oncology

## 2011-07-31 ENCOUNTER — Other Ambulatory Visit: Payer: Self-pay | Admitting: Oncology

## 2011-07-31 DIAGNOSIS — D509 Iron deficiency anemia, unspecified: Secondary | ICD-10-CM

## 2011-07-31 DIAGNOSIS — T451X5A Adverse effect of antineoplastic and immunosuppressive drugs, initial encounter: Secondary | ICD-10-CM

## 2011-07-31 DIAGNOSIS — R7401 Elevation of levels of liver transaminase levels: Secondary | ICD-10-CM

## 2011-07-31 DIAGNOSIS — C169 Malignant neoplasm of stomach, unspecified: Secondary | ICD-10-CM

## 2011-07-31 LAB — CBC
HCT: 31.3 — ABNORMAL LOW
HCT: 31.6 — ABNORMAL LOW
HCT: 33.3 — ABNORMAL LOW
Hemoglobin: 10.6 — ABNORMAL LOW
Hemoglobin: 10.6 — ABNORMAL LOW
MCHC: 33
MCV: 73.5 — ABNORMAL LOW
MCV: 74.1 — ABNORMAL LOW
Platelets: 213
Platelets: 226
Platelets: 255
RBC: 4.38
RDW: 17 — ABNORMAL HIGH
RDW: 17.5 — ABNORMAL HIGH
WBC: 13.1 — ABNORMAL HIGH
WBC: 21 — ABNORMAL HIGH
WBC: 4.1

## 2011-07-31 LAB — COMPREHENSIVE METABOLIC PANEL
ALT: 20
ALT: 115 U/L — ABNORMAL HIGH (ref 0–53)
AST: 24
AST: 63 U/L — ABNORMAL HIGH (ref 0–37)
Albumin: 3.4 — ABNORMAL LOW
Alkaline Phosphatase: 27 — ABNORMAL LOW
Alkaline Phosphatase: 28 U/L — ABNORMAL LOW (ref 39–117)
Chloride: 114 — ABNORMAL HIGH
GFR calc Af Amer: >60
Glucose, Bld: 91 mg/dL (ref 70–99)
Potassium: 4.8
Sodium: 143 mEq/L (ref 135–145)
Total Bilirubin: 0.8
Total Bilirubin: 1.1 mg/dL (ref 0.3–1.2)
Total Protein: 6.1 g/dL (ref 6.0–8.3)

## 2011-07-31 LAB — BASIC METABOLIC PANEL
BUN: 17
Calcium: 8.3 — ABNORMAL LOW
Chloride: 100
Chloride: 99
Creatinine, Ser: 2.05 — ABNORMAL HIGH
GFR calc non Af Amer: 33 — ABNORMAL LOW
GFR calc non Af Amer: 44 — ABNORMAL LOW
Glucose, Bld: 129 — ABNORMAL HIGH
Glucose, Bld: 144 — ABNORMAL HIGH
Glucose, Bld: 150 — ABNORMAL HIGH
Potassium: 3.4 — ABNORMAL LOW
Potassium: 3.8
Potassium: 4
Sodium: 139
Sodium: 140

## 2011-07-31 LAB — TYPE AND SCREEN
ABO/RH(D): A POS
Antibody Screen: NEGATIVE

## 2011-07-31 LAB — DIFFERENTIAL
Basophils Absolute: 0
Basophils Relative: 0
Eosinophils Absolute: 0 — ABNORMAL LOW
Eosinophils Relative: 0

## 2011-08-07 ENCOUNTER — Other Ambulatory Visit: Payer: Self-pay | Admitting: Oncology

## 2011-08-07 ENCOUNTER — Ambulatory Visit (HOSPITAL_BASED_OUTPATIENT_CLINIC_OR_DEPARTMENT_OTHER): Payer: Managed Care, Other (non HMO) | Admitting: Oncology

## 2011-08-07 VITALS — Ht 73.5 in | Wt 137.2 lb

## 2011-08-07 DIAGNOSIS — D518 Other vitamin B12 deficiency anemias: Secondary | ICD-10-CM

## 2011-08-07 DIAGNOSIS — R7402 Elevation of levels of lactic acid dehydrogenase (LDH): Secondary | ICD-10-CM

## 2011-08-07 DIAGNOSIS — T451X5A Adverse effect of antineoplastic and immunosuppressive drugs, initial encounter: Secondary | ICD-10-CM

## 2011-08-07 DIAGNOSIS — C169 Malignant neoplasm of stomach, unspecified: Secondary | ICD-10-CM

## 2011-08-07 DIAGNOSIS — C49A0 Gastrointestinal stromal tumor, unspecified site: Secondary | ICD-10-CM

## 2011-08-07 DIAGNOSIS — D509 Iron deficiency anemia, unspecified: Secondary | ICD-10-CM

## 2011-08-07 LAB — CBC WITH DIFFERENTIAL/PLATELET
Basophils Absolute: 0.1 10*3/uL (ref 0.0–0.1)
Eosinophils Absolute: 0 10*3/uL (ref 0.0–0.5)
LYMPH%: 30.8 % (ref 14.0–49.0)
MCV: 80.3 fL (ref 79.3–98.0)
MONO%: 7.4 % (ref 0.0–14.0)
NEUT#: 7 10*3/uL — ABNORMAL HIGH (ref 1.5–6.5)
Platelets: 341 10*3/uL (ref 140–400)
RBC: 4.01 10*6/uL — ABNORMAL LOW (ref 4.20–5.82)

## 2011-08-07 LAB — COMPREHENSIVE METABOLIC PANEL
Alkaline Phosphatase: 29 U/L — ABNORMAL LOW (ref 39–117)
BUN: 11 mg/dL (ref 6–23)
Glucose, Bld: 161 mg/dL — ABNORMAL HIGH (ref 70–99)
Sodium: 141 mEq/L (ref 135–145)
Total Bilirubin: 0.6 mg/dL (ref 0.3–1.2)

## 2011-08-14 ENCOUNTER — Other Ambulatory Visit: Payer: Self-pay | Admitting: Oncology

## 2011-08-14 ENCOUNTER — Encounter (HOSPITAL_BASED_OUTPATIENT_CLINIC_OR_DEPARTMENT_OTHER): Payer: Managed Care, Other (non HMO) | Admitting: Oncology

## 2011-08-14 DIAGNOSIS — C169 Malignant neoplasm of stomach, unspecified: Secondary | ICD-10-CM

## 2011-08-14 LAB — COMPREHENSIVE METABOLIC PANEL
Albumin: 3.7 g/dL (ref 3.5–5.2)
BUN: 12 mg/dL (ref 6–23)
CO2: 28 mEq/L (ref 19–32)
Calcium: 8.8 mg/dL (ref 8.4–10.5)
Chloride: 103 mEq/L (ref 96–112)
Glucose, Bld: 142 mg/dL — ABNORMAL HIGH (ref 70–99)
Potassium: 3.4 mEq/L — ABNORMAL LOW (ref 3.5–5.3)

## 2011-08-19 DIAGNOSIS — C49A0 Gastrointestinal stromal tumor, unspecified site: Secondary | ICD-10-CM | POA: Insufficient documentation

## 2011-08-21 ENCOUNTER — Other Ambulatory Visit: Payer: Self-pay | Admitting: Oncology

## 2011-08-21 ENCOUNTER — Encounter (HOSPITAL_BASED_OUTPATIENT_CLINIC_OR_DEPARTMENT_OTHER): Payer: Managed Care, Other (non HMO) | Admitting: Oncology

## 2011-08-21 DIAGNOSIS — D518 Other vitamin B12 deficiency anemias: Secondary | ICD-10-CM

## 2011-08-21 DIAGNOSIS — C169 Malignant neoplasm of stomach, unspecified: Secondary | ICD-10-CM

## 2011-08-21 LAB — COMPREHENSIVE METABOLIC PANEL
ALT: 53 U/L (ref 0–53)
AST: 35 U/L (ref 0–37)
Albumin: 3.5 g/dL (ref 3.5–5.2)
Calcium: 8.6 mg/dL (ref 8.4–10.5)
Chloride: 104 mEq/L (ref 96–112)
Potassium: 3.9 mEq/L (ref 3.5–5.3)

## 2011-08-28 ENCOUNTER — Other Ambulatory Visit (HOSPITAL_BASED_OUTPATIENT_CLINIC_OR_DEPARTMENT_OTHER): Payer: Managed Care, Other (non HMO) | Admitting: Lab

## 2011-08-28 ENCOUNTER — Other Ambulatory Visit: Payer: Self-pay | Admitting: Oncology

## 2011-08-28 DIAGNOSIS — C169 Malignant neoplasm of stomach, unspecified: Secondary | ICD-10-CM

## 2011-08-28 DIAGNOSIS — D518 Other vitamin B12 deficiency anemias: Secondary | ICD-10-CM

## 2011-08-28 LAB — COMPREHENSIVE METABOLIC PANEL
ALT: 41 U/L (ref 0–53)
AST: 30 U/L (ref 0–37)
CO2: 26 mEq/L (ref 19–32)
Creatinine, Ser: 0.85 mg/dL (ref 0.50–1.35)
Total Bilirubin: 0.6 mg/dL (ref 0.3–1.2)

## 2011-08-29 ENCOUNTER — Telehealth: Payer: Self-pay

## 2011-08-29 NOTE — Telephone Encounter (Signed)
LEFT MESSAGE FOR MR. Haller THAT HIS CHEMISTRIES WERE GOOD YESTERDAY 08-28-11 PER DR. Darrold Span.  HIS GLUCOSE WAS 98.  CONTINUE THE GLEEVEC AT 100 MG DAILY AND PREDNISONE 20 MG AT BREAKFAST AND 10 MG WITH LUNCH.  LET OUR OFFICE KNOW WHAT DR. Lenis Noon SAYS AT APPT. 08-31-11.

## 2011-08-31 ENCOUNTER — Telehealth: Payer: Self-pay

## 2011-08-31 NOTE — Telephone Encounter (Signed)
MR. Grunow CALLED TO LET DR. Darrold Span KNOW THAT THE CT SCAN  TODAY AT [facility] SHOWED HIS TUMOR WAS 1/3 THE SIZE IN June.  DR. Lenis Noon SAID THAT HE HAD MAXIMAL SHRINKAGE AND WILL HAVE SURGERY 09-27-11.  HE WILL NEED TO BE WEENDED  OFF THE STEROIDS.  HE WILL DISCUSS THIS WITH OUR OFFICE AT HIS LAB VISIT 09-04-11.

## 2011-09-03 ENCOUNTER — Telehealth: Payer: Self-pay

## 2011-09-03 NOTE — Telephone Encounter (Signed)
TOLD MR. Harshbarger AND TOLD HIM THAT DR. Darrold Span WANTS HIM TO DECREASE THE PREDNISONE TO 20 MG DAILY UNTIL VISIT 09-11-11.  OK TO TAKE 2 TABS WITH BREAKFAST AND GLEEVEC.Henry [name]

## 2011-09-04 ENCOUNTER — Other Ambulatory Visit (HOSPITAL_BASED_OUTPATIENT_CLINIC_OR_DEPARTMENT_OTHER): Payer: Managed Care, Other (non HMO) | Admitting: Lab

## 2011-09-04 ENCOUNTER — Other Ambulatory Visit: Payer: Self-pay | Admitting: Oncology

## 2011-09-04 DIAGNOSIS — C169 Malignant neoplasm of stomach, unspecified: Secondary | ICD-10-CM

## 2011-09-04 DIAGNOSIS — D518 Other vitamin B12 deficiency anemias: Secondary | ICD-10-CM

## 2011-09-04 LAB — COMPREHENSIVE METABOLIC PANEL
CO2: 27 mEq/L (ref 19–32)
Calcium: 8.6 mg/dL (ref 8.4–10.5)
Chloride: 104 mEq/L (ref 96–112)
Glucose, Bld: 251 mg/dL — ABNORMAL HIGH (ref 70–99)
Sodium: 140 mEq/L (ref 135–145)
Total Bilirubin: 0.8 mg/dL (ref 0.3–1.2)
Total Protein: 5.9 g/dL — ABNORMAL LOW (ref 6.0–8.3)

## 2011-09-11 ENCOUNTER — Other Ambulatory Visit (HOSPITAL_BASED_OUTPATIENT_CLINIC_OR_DEPARTMENT_OTHER): Payer: Managed Care, Other (non HMO)

## 2011-09-11 ENCOUNTER — Ambulatory Visit (HOSPITAL_BASED_OUTPATIENT_CLINIC_OR_DEPARTMENT_OTHER): Payer: Managed Care, Other (non HMO) | Admitting: Oncology

## 2011-09-11 ENCOUNTER — Other Ambulatory Visit: Payer: Self-pay | Admitting: Oncology

## 2011-09-11 VITALS — BP 115/75 | HR 97 | Temp 98.3°F | Ht 73.5 in | Wt 136.5 lb

## 2011-09-11 DIAGNOSIS — C49A0 Gastrointestinal stromal tumor, unspecified site: Secondary | ICD-10-CM

## 2011-09-11 DIAGNOSIS — D649 Anemia, unspecified: Secondary | ICD-10-CM

## 2011-09-11 DIAGNOSIS — Z23 Encounter for immunization: Secondary | ICD-10-CM

## 2011-09-11 DIAGNOSIS — C169 Malignant neoplasm of stomach, unspecified: Secondary | ICD-10-CM

## 2011-09-11 DIAGNOSIS — D518 Other vitamin B12 deficiency anemias: Secondary | ICD-10-CM

## 2011-09-11 LAB — CBC WITH DIFFERENTIAL/PLATELET
BASO%: 0.6 % (ref 0.0–2.0)
Eosinophils Absolute: 0 10*3/uL (ref 0.0–0.5)
MONO#: 0.9 10*3/uL (ref 0.1–0.9)
NEUT#: 5.9 10*3/uL (ref 1.5–6.5)
Platelets: 462 10*3/uL — ABNORMAL HIGH (ref 140–400)
RBC: 4.07 10*6/uL — ABNORMAL LOW (ref 4.20–5.82)
RDW: 16.2 % — ABNORMAL HIGH (ref 11.0–14.6)
WBC: 10 10*3/uL (ref 4.0–10.3)
lymph#: 3.1 10*3/uL (ref 0.9–3.3)

## 2011-09-11 LAB — COMPREHENSIVE METABOLIC PANEL
ALT: 36 U/L (ref 0–53)
Albumin: 3.7 g/dL (ref 3.5–5.2)
CO2: 27 mEq/L (ref 19–32)
Chloride: 104 mEq/L (ref 96–112)
Glucose, Bld: 167 mg/dL — ABNORMAL HIGH (ref 70–99)
Potassium: 3.4 mEq/L — ABNORMAL LOW (ref 3.5–5.3)
Sodium: 141 mEq/L (ref 135–145)
Total Protein: 6.2 g/dL (ref 6.0–8.3)

## 2011-09-11 MED ORDER — INFLUENZA VIRUS VACC SPLIT PF IM SUSP
0.5000 mL | Freq: Once | INTRAMUSCULAR | Status: AC
  Administered 2011-09-11: 0.5 mL via INTRAMUSCULAR
  Filled 2011-09-11: qty 0.5

## 2011-09-11 MED ORDER — INFLUENZA VIRUS VACC SPLIT PF IM SUSP
0.5000 mL | INTRAMUSCULAR | Status: DC
  Filled 2011-09-11: qty 0.5

## 2011-09-11 NOTE — Progress Notes (Signed)
OFFICE PROGRESS NOTE INTERVAL HISTORY:  Date of Visit: Sep 11, 2011            MDs: Dr. Herbie [name], Dr. Loreen [name], Dr. 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 The reduced dose of gleevec was based on his history of marked elevation in liver chemistries on higher doses of gleevec, and the addition of prednisone was based on case reports of better hepatic tolerance of gleevec with this. Henry [name] had repeat scans at [facility] on Aug 31, 2011, reportedly with significant decrease in size of the recurrent disease. He saw Henry [name] following the scans and is scheduled for surgery Dec.6, 2012. We decreased the prednisone to 20 mg daily as of Nov 12, and will decrease this now to 10 mg daily. As he has been on daily steroids now for over 3 months, I expect that he will need stress dose steroids around surgery. [name] understands this recommendation and this note will copy to Henry [name]. [name] cannot tell any difference overall with the decrease in prednisone dose "I am still manic". He denies any pain or other abdominal discomfort. He has had no symptoms of infection, no stomatitis or esophagitis symptoms, is able to sleep, is eating well, has not seen any bleeding, bowels ok.   Review of Systems otherwise: some dental problems with procedure by dentist this week. No SOB. Remainder of 10 point ROS negative. PMH, FH unchanged Social History: Widowed, wife also my patient and died of gyn cancer spring 2011. 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  Objective:  Vital signs in last 24 hours:  BP 115/75  Pulse 97  Temp(Src) 98.3 F (36.8 C) (Oral)  Ht 6' 1.5" (1.867 m)  Wt 136 lb 8 oz (61.916 kg)  BMI 17.76 kg/m2 Alert, talkative, easily and quickly mobile about the office, pleasant and  cooperative HEENT:mucous membranes moist, pharynx normal without lesions LymphaticsCervical, supraclavicular, and axillary nodes normal. Resp: clear to auscultation bilaterally and normal percussion bilaterally Cardio: regular rate and rhythm GI: soft, non-tender; bowel sounds normal; no masses,  no organomegaly. Surgical incisions well healed. Extremities: no edema, cords, tenderness. Neuro:speech and thought process appropriate Skin without rash   Lab Results:   Basename 09/11/11 1110  WBC 10.0  HGB 9.5*  HCT 31.3*  PLT 462*    BMET  Basename 09/11/11 1110  NA 141  K 3.4*  CL 104  CO2 27  GLUCOSE 167*  BUN 12  CREATININE 0.81  CALCIUM 8.3*   Remainder of full CMET: Tb 1.0, AP 28, GOT 24, GPT 36 TP 6.2, ALB 3.7   Studies/Results:  No results found. CT/?PET done at [facility] 08-31-11 as above  Medications: I have reviewed the patient's current medications. Prednisone taper as above, however there is not time prior to surgery Dec 6 to DC prednisone entirely.  Assessment/Plan:  **1. GIST initially disgnosed 2007, with subtotal gastrectomy with Roux-en-Y, then briefly on study with Gleevec which he did not tolerate due to marked elevations in LFTs. He had recurrent GIST in late 2012 with further surgery by Henry [name] March 2011, then rapid elevation of LFTs with 50% dosing (200 mg/d) gleevec, with gleevec again discontinued after short period. He had further progression in June 2012, has tolerated gleevec at 100mg /d with prednisone, and with adequate  shrinkage of tumor to allow surgery upcoming.  2. On prednisone daily since early August. He will need stress steroid coverage with surgery. 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 B12 deficiency related to GI surgery: will give B12 Nov.27 on schedule 5. Progressive anemia: B12 as above and will also try again with po iron, samples of Integra given. Note he had severe allergic skin  reaction to feraheme previously and should not have that drug again. 6. Flu vaccine given today.  I have scheduled [name] back to this office with B12 in late Dec., tho certainly we will see him prior if needed. Henry [name]'s help much appreciated.        Henry Packer, MD   09/11/2011, 8:54 PM

## 2011-09-12 ENCOUNTER — Telehealth: Payer: Self-pay

## 2011-09-12 NOTE — Telephone Encounter (Signed)
FAXED OFFICE NOTE FROM 09-11-11 TO DR. Lenis Noon FOR SURGERY DEC. 6, 2012.   NOTED ON FAX COVER SHEET PT. NEEDS STRESS STEROID COVERAGE PER DR. Darrold Span. OFFICE PHONE: [phone].

## 2011-09-14 ENCOUNTER — Other Ambulatory Visit: Payer: Self-pay | Admitting: Oncology

## 2011-09-18 ENCOUNTER — Ambulatory Visit (HOSPITAL_BASED_OUTPATIENT_CLINIC_OR_DEPARTMENT_OTHER): Payer: Managed Care, Other (non HMO)

## 2011-09-18 DIAGNOSIS — C49A0 Gastrointestinal stromal tumor, unspecified site: Secondary | ICD-10-CM

## 2011-09-18 DIAGNOSIS — D518 Other vitamin B12 deficiency anemias: Secondary | ICD-10-CM

## 2011-09-18 DIAGNOSIS — D649 Anemia, unspecified: Secondary | ICD-10-CM

## 2011-09-18 MED ORDER — CYANOCOBALAMIN 1000 MCG/ML IJ SOLN
1000.0000 ug | Freq: Once | INTRAMUSCULAR | Status: AC
  Administered 2011-09-18: 1000 ug via INTRAMUSCULAR

## 2011-10-19 ENCOUNTER — Ambulatory Visit (HOSPITAL_BASED_OUTPATIENT_CLINIC_OR_DEPARTMENT_OTHER): Payer: Managed Care, Other (non HMO) | Admitting: Oncology

## 2011-10-19 ENCOUNTER — Other Ambulatory Visit (HOSPITAL_BASED_OUTPATIENT_CLINIC_OR_DEPARTMENT_OTHER): Payer: Managed Care, Other (non HMO) | Admitting: Lab

## 2011-10-19 ENCOUNTER — Other Ambulatory Visit: Payer: Self-pay

## 2011-10-19 VITALS — BP 98/58 | HR 69 | Temp 97.1°F | Ht 73.5 in | Wt 132.8 lb

## 2011-10-19 DIAGNOSIS — D649 Anemia, unspecified: Secondary | ICD-10-CM

## 2011-10-19 DIAGNOSIS — C494 Malignant neoplasm of connective and soft tissue of abdomen: Secondary | ICD-10-CM

## 2011-10-19 DIAGNOSIS — C169 Malignant neoplasm of stomach, unspecified: Secondary | ICD-10-CM

## 2011-10-19 DIAGNOSIS — C49A0 Gastrointestinal stromal tumor, unspecified site: Secondary | ICD-10-CM

## 2011-10-19 DIAGNOSIS — Z903 Acquired absence of stomach [part of]: Secondary | ICD-10-CM

## 2011-10-19 LAB — CBC WITH DIFFERENTIAL/PLATELET
Basophils Absolute: 0.1 10*3/uL (ref 0.0–0.1)
Eosinophils Absolute: 0 10*3/uL (ref 0.0–0.5)
HCT: 26.1 % — ABNORMAL LOW (ref 38.4–49.9)
HGB: 7.9 g/dL — ABNORMAL LOW (ref 13.0–17.1)
MCH: 22.3 pg — ABNORMAL LOW (ref 27.2–33.4)
MCV: 73.5 fL — ABNORMAL LOW (ref 79.3–98.0)
MONO%: 10.9 % (ref 0.0–14.0)
NEUT#: 3.8 10*3/uL (ref 1.5–6.5)
NEUT%: 44.8 % (ref 39.0–75.0)
Platelets: 504 10*3/uL — ABNORMAL HIGH (ref 140–400)
RDW: 16.5 % — ABNORMAL HIGH (ref 11.0–14.6)

## 2011-10-19 LAB — COMPREHENSIVE METABOLIC PANEL
AST: 28 U/L (ref 0–37)
Albumin: 3.8 g/dL (ref 3.5–5.2)
Alkaline Phosphatase: 19 U/L — ABNORMAL LOW (ref 39–117)
Potassium: 3.1 mEq/L — ABNORMAL LOW (ref 3.5–5.3)
Sodium: 141 mEq/L (ref 135–145)
Total Bilirubin: 0.7 mg/dL (ref 0.3–1.2)
Total Protein: 6.1 g/dL (ref 6.0–8.3)

## 2011-10-19 MED ORDER — IMATINIB MESYLATE 100 MG PO TABS: 100.0000 mg | ORAL_TABLET | Freq: Every day | ORAL | Status: DC

## 2011-10-19 MED ORDER — CYANOCOBALAMIN 1000 MCG/ML IJ SOLN
1000.0000 ug | Freq: Once | INTRAMUSCULAR | Status: AC
  Administered 2011-10-19: 1000 ug via INTRAMUSCULAR

## 2011-10-19 MED ORDER — CYANOCOBALAMIN 1000 MCG/ML IJ SOLN: 1000.0000 ug | Freq: Once | INTRAMUSCULAR | Status: DC

## 2011-10-22 ENCOUNTER — Telehealth: Payer: Self-pay

## 2011-10-22 ENCOUNTER — Other Ambulatory Visit: Payer: Self-pay

## 2011-10-22 DIAGNOSIS — E876 Hypokalemia: Secondary | ICD-10-CM

## 2011-10-22 MED ORDER — POTASSIUM CHLORIDE CRYS ER 20 MEQ PO TBCR: 20.0000 meq | EXTENDED_RELEASE_TABLET | Freq: Every day | ORAL | Status: DC

## 2011-10-22 NOTE — Telephone Encounter (Signed)
TOLD Henry [name] THAT HIS LIVER FUNCTION TESTS WERE GOOD FROM 10-19-11 PER DR. Darrold Span.  HIS KCL LEVEL WAS A LITTLE LOW AT 3.1.   DR. Darrold Span WANTS TO SATRT HIM ON KCL 20 MEQ DAILY.  WILL ELECTRONICALLY SEND PRESCRIPTION TO [facility].  PT. VERBALIZED UNDERSTANDING.

## 2011-10-23 NOTE — Progress Notes (Signed)
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   History is of GIST initially disgnosed 2007, with subtotal gastrectomy with Roux-en-Y, then briefly on study with Gleevec which he did not tolerate due to marked elevations in LFTs. He had recurrent GIST in late 2012 with further surgery by Dr.Levine March 2011, then rapid elevation of LFTs with 50% dosing (200 mg/d) gleevec, with gleevec again discontinued after short period in 2011.  He had further progression in June 2012, has tolerated gleevec at 100mg /d with prednisone, with adequate shrinkage of tumor to allow surgery by Dr. Lenis Noon Sep 27, 2011. I have requested and received the operative note and pathology report from [facility], which will be scanned into this EMR. To summarize, at laparotomy he had extensive adhesions but no gross peritoneal, hepatic or bowel involvement. The mass itself was cystic and soft and not densely adherent to any visceral structures; estimated blood loss was 200cc. Pathology([facility] [phone]) recurrent GIST 8.9 cm with resection margins not involved and comment that microscopically this exhibits an epithelial and glandular growth pattern which differs from previous specimens; nuclei medium-sized and bland; immunohistochemical panel postive for c-kit. No mitotic index is reported.  Mr.Bukhari was discharged on Sep 30, 2011, saw Dr.Levine in follow up also this am (that note not yet available) and understands that he should have repeat scans in ~ 3 - 4 months, possibly in [place].  He has been recovering well from the surgery, resumed Gleevec and oral prednisone at discharge, is no longer using any pain medications.  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 No pain. Appetite and po intake at baseline, tho he did loose weight with surgery. No noted bleeding. Bowels ok. No SOB. Has been using Integra intermittently. No cardiac symptoms. No swelling LE. No mucositis symptoms. No reflux. Remainder of 10 point ROS negative.  Objective:  Vital signs in last 24 hours:  BP 98/58  Pulse 69  Temp(Src) 97.1 F (36.2 C) (Oral)  Ht 6' 1.5" (1.867 m)  Wt 132 lb 12.8 oz (60.238 kg)  BMI 17.28 kg/m2 This weight is down from 136.5 lbs in Nov.    HEENT:mucous membranes moist, pharynx normal without lesions. Mucous membranes somewhat pale. PERRL, not icteric. No JVD. LymphaticsCervical, supraclavicular, and axillary nodes normal. Resp: clear to auscultation bilaterally and normal percussion bilaterally Cardio: regular rate and rhythm GI: midline incision appears healed without tenderness, erythema. Abd soft, not distended, nontender. Extremities: no clubbing, cyanosis, edema, cords, tenderness Skin not remarkable Neuro: appropriate affect and speech    Lab Results:  CBC today: WBC 8.6, ANC 3.8, Hgb 7.9  (c/w 9.5 in Nov and 10.3 in Oct), plt 504K, MCV 73.5 BMET/CMET Na 141, K 3.1, Cl 106, CO2 25, glu 78, BUN 15, creat 0.8, T.b 0.7, AP 19, AST 28, ALT 24, TP 6.1, alb 3.8, Ca 8.3  Studies/Results:  No results found.  Medications: I have reviewed the patient's current medications. He will change prednisone to 10 mg every other day and continue gleevec 100 mg daily; goal is to decrease prednisone to least necessary to allow continued treatment with gleevec/ avoid previous gleevec-induced hepatitis.   Assessment/Plan:  1. GIST: initial diagnosis and surgery in 2007, then recurrence  resected in 2011 and most recent recurrence resected by Dr.Levine 09-27-2011. He has tolerated gleevec at 100 mg/day with prednisone, with clear benefit of the GIST and no hepatitis problems. I agree with  Dr.Levine's recommendations to continue gleevec longterm and will try to gradually decrease prednisone to least amount necessary. He will have CMET weekly until next MD visit late Jan. 2.Progressive anemia: multifactorial with blood draws/surgical blood loss, inability to absorb significant oral iron due to gastrectomy etc, possibly some contribution of the gleevec; he is continuing B12 injection monthly. Note he had severe rash to [facility] previously, and if IV iron attempted again would need a different preparation. As he does not seem symptomatic with present lower hemoglobin, he will continue po iron and we will recheck hgb in ~ 2 weeks. If hgb lower or symptomatic, would either give PRBCs or a different formulation of IV iron.  Patient was comfortable with discussion and in agreement with plan. I will discuss IV iron options with pharmacy. HIM to request note from Dr.Levine when available.       Gregg Holster P, MD   10/23/2011, 10:03 AM

## 2011-10-24 ENCOUNTER — Encounter: Payer: Self-pay | Admitting: Oncology

## 2011-10-24 NOTE — Progress Notes (Signed)
Re iron deficiency and previous allergic reaction to feraheme: [facility] pharmacist recommends iron dextran with test dose.

## 2011-10-25 ENCOUNTER — Other Ambulatory Visit: Payer: Self-pay

## 2011-10-25 DIAGNOSIS — C49A0 Gastrointestinal stromal tumor, unspecified site: Secondary | ICD-10-CM

## 2011-10-25 MED ORDER — PREDNISONE 10 MG PO TABS: 10.0000 mg | ORAL_TABLET | ORAL | Status: DC

## 2011-10-26 ENCOUNTER — Other Ambulatory Visit (HOSPITAL_BASED_OUTPATIENT_CLINIC_OR_DEPARTMENT_OTHER): Payer: Managed Care, Other (non HMO)

## 2011-10-26 DIAGNOSIS — D509 Iron deficiency anemia, unspecified: Secondary | ICD-10-CM

## 2011-10-26 DIAGNOSIS — C49A0 Gastrointestinal stromal tumor, unspecified site: Secondary | ICD-10-CM

## 2011-10-26 LAB — COMPREHENSIVE METABOLIC PANEL
BUN: 11 mg/dL (ref 6–23)
CO2: 28 mEq/L (ref 19–32)
Calcium: 8.6 mg/dL (ref 8.4–10.5)
Chloride: 105 mEq/L (ref 96–112)
Creatinine, Ser: 0.93 mg/dL (ref 0.50–1.35)
Glucose, Bld: 97 mg/dL (ref 70–99)
Total Bilirubin: 0.8 mg/dL (ref 0.3–1.2)

## 2011-10-29 ENCOUNTER — Telehealth: Payer: Self-pay

## 2011-10-29 NOTE — Telephone Encounter (Signed)
TOLD [name] THAT HIS CHEMISTRIES FROM 10-26-11 WERE GOOD PER DR. Darrold [name].  KCL WAS UP TO 3.6 LFT'S FINE AND GLUCOSE 97.   HE IS TO CONTINUE QOD PREDNISONE AS PLANNED. HIS HGB. WILL BE RECHECKED  WITH LABS THIS Friday 11-02-11.

## 2011-11-02 ENCOUNTER — Other Ambulatory Visit (HOSPITAL_BASED_OUTPATIENT_CLINIC_OR_DEPARTMENT_OTHER): Payer: Managed Care, Other (non HMO)

## 2011-11-02 DIAGNOSIS — C49A0 Gastrointestinal stromal tumor, unspecified site: Secondary | ICD-10-CM

## 2011-11-02 DIAGNOSIS — C169 Malignant neoplasm of stomach, unspecified: Secondary | ICD-10-CM

## 2011-11-02 LAB — CBC WITH DIFFERENTIAL/PLATELET
BASO%: 0.7 % (ref 0.0–2.0)
Basophils Absolute: 0.1 10*3/uL (ref 0.0–0.1)
Eosinophils Absolute: 0 10*3/uL (ref 0.0–0.5)
HCT: 28.8 % — ABNORMAL LOW (ref 38.4–49.9)
HGB: 8.5 g/dL — ABNORMAL LOW (ref 13.0–17.1)
LYMPH%: 38.6 % (ref 14.0–49.0)
MCHC: 29.5 g/dL — ABNORMAL LOW (ref 32.0–36.0)
MONO#: 0.6 10*3/uL (ref 0.1–0.9)
NEUT#: 3.6 10*3/uL (ref 1.5–6.5)
NEUT%: 52.3 % (ref 39.0–75.0)
Platelets: 438 10*3/uL — ABNORMAL HIGH (ref 140–400)
WBC: 6.9 10*3/uL (ref 4.0–10.3)
lymph#: 2.7 10*3/uL (ref 0.9–3.3)

## 2011-11-02 LAB — COMPREHENSIVE METABOLIC PANEL
AST: 22 U/L (ref 0–37)
Albumin: 4.3 g/dL (ref 3.5–5.2)
BUN: 15 mg/dL (ref 6–23)
CO2: 25 mEq/L (ref 19–32)
Calcium: 8.9 mg/dL (ref 8.4–10.5)
Chloride: 106 mEq/L (ref 96–112)
Potassium: 3.9 mEq/L (ref 3.5–5.3)

## 2011-11-05 ENCOUNTER — Telehealth: Payer: Self-pay

## 2011-11-05 NOTE — Telephone Encounter (Signed)
TOLD MR.Dunlevy THAT HIS HGB WAS BETTER AT 8.5 ON 11-02-11 PER DR. Darrold Span. CONTINUE THE ORAL IRON PREFERABLY TWICE DAILY WITH OJ.   CHEMISTRIES WERE GOOD- GLUCOSE ALSO GOOD AT 106. DR. Darrold Span WANTS HIM TO DECREASE HIS PREDNISONE TO 5 MG EVERY OTHER DAY FROM 10 MG QOD.  PT. VERBALIZED UNDERSTANDING. PT. STATED THAT A FRIEND WAS DX WITH MELANOMA.  HE HAS A COUPLE OF PLACES ON HIS SKIN THAT NEEDS TO BE LOOKED AT.   SUGGESTED THAT HE GET IN TOUCH WITH A DERMATOLOGIST FOR A WHOLE BODY EXAM.  IT CAN TAKE A WHILE TO GET IN TO SEE ONE.   HE CAN SHOW THE AREAS IN QUESTION TO DR. Darrold Span AT HIS VISIT 11-20-11.

## 2011-11-09 ENCOUNTER — Other Ambulatory Visit: Payer: Managed Care, Other (non HMO)

## 2011-11-09 ENCOUNTER — Other Ambulatory Visit: Payer: Self-pay | Admitting: Oncology

## 2011-11-09 ENCOUNTER — Ambulatory Visit (HOSPITAL_BASED_OUTPATIENT_CLINIC_OR_DEPARTMENT_OTHER): Payer: Managed Care, Other (non HMO)

## 2011-11-09 VITALS — BP 116/76 | HR 75 | Temp 98.2°F

## 2011-11-09 DIAGNOSIS — C49A0 Gastrointestinal stromal tumor, unspecified site: Secondary | ICD-10-CM

## 2011-11-09 DIAGNOSIS — C494 Malignant neoplasm of connective and soft tissue of abdomen: Secondary | ICD-10-CM

## 2011-11-09 LAB — COMPREHENSIVE METABOLIC PANEL
ALT: 30 U/L (ref 0–53)
AST: 28 U/L (ref 0–37)
Chloride: 107 mEq/L (ref 96–112)
Creatinine, Ser: 1.12 mg/dL (ref 0.50–1.35)
Sodium: 141 mEq/L (ref 135–145)
Total Bilirubin: 1 mg/dL (ref 0.3–1.2)
Total Protein: 6.1 g/dL (ref 6.0–8.3)

## 2011-11-09 MED ORDER — CYANOCOBALAMIN 1000 MCG/ML IJ SOLN
1000.0000 ug | Freq: Once | INTRAMUSCULAR | Status: AC
  Administered 2011-11-09: 1000 ug via INTRAMUSCULAR

## 2011-11-12 ENCOUNTER — Ambulatory Visit: Payer: Managed Care, Other (non HMO)

## 2011-11-13 ENCOUNTER — Telehealth: Payer: Self-pay

## 2011-11-13 NOTE — Telephone Encounter (Signed)
TOLD [name] THAT HIS LABS FROM 11-09-11 LOOKED GOOD.  DR. Darrold Span SAID TO STAY ON PREDNISONE 5 MG EVERY OTHER DAY. KEEP APPT. 11-20-11 AS SCHEDULED.  PT. VERBALIZED UNDERSTANDING.

## 2011-11-16 ENCOUNTER — Other Ambulatory Visit: Payer: Managed Care, Other (non HMO) | Admitting: Lab

## 2011-11-20 ENCOUNTER — Other Ambulatory Visit (HOSPITAL_BASED_OUTPATIENT_CLINIC_OR_DEPARTMENT_OTHER): Payer: Managed Care, Other (non HMO) | Admitting: Lab

## 2011-11-20 ENCOUNTER — Ambulatory Visit (HOSPITAL_BASED_OUTPATIENT_CLINIC_OR_DEPARTMENT_OTHER): Payer: Managed Care, Other (non HMO) | Admitting: Oncology

## 2011-11-20 ENCOUNTER — Telehealth: Payer: Self-pay | Admitting: Oncology

## 2011-11-20 VITALS — BP 117/73 | HR 66 | Temp 98.0°F | Ht 72.5 in | Wt 133.2 lb

## 2011-11-20 DIAGNOSIS — Z85528 Personal history of other malignant neoplasm of kidney: Secondary | ICD-10-CM

## 2011-11-20 DIAGNOSIS — C494 Malignant neoplasm of connective and soft tissue of abdomen: Secondary | ICD-10-CM

## 2011-11-20 DIAGNOSIS — C49A0 Gastrointestinal stromal tumor, unspecified site: Secondary | ICD-10-CM

## 2011-11-20 DIAGNOSIS — D509 Iron deficiency anemia, unspecified: Secondary | ICD-10-CM

## 2011-11-20 DIAGNOSIS — E538 Deficiency of other specified B group vitamins: Secondary | ICD-10-CM

## 2011-11-20 LAB — COMPREHENSIVE METABOLIC PANEL
AST: 26 U/L (ref 0–37)
Alkaline Phosphatase: 24 U/L — ABNORMAL LOW (ref 39–117)
BUN: 8 mg/dL (ref 6–23)
Calcium: 8.3 mg/dL — ABNORMAL LOW (ref 8.4–10.5)
Creatinine, Ser: 0.78 mg/dL (ref 0.50–1.35)
Total Bilirubin: 1 mg/dL (ref 0.3–1.2)

## 2011-11-20 LAB — CBC WITH DIFFERENTIAL/PLATELET
BASO%: 1.9 % (ref 0.0–2.0)
EOS%: 1.7 % (ref 0.0–7.0)
HCT: 25.8 % — ABNORMAL LOW (ref 38.4–49.9)
LYMPH%: 40.7 % (ref 14.0–49.0)
MCH: 21.3 pg — ABNORMAL LOW (ref 27.2–33.4)
MCHC: 29.8 g/dL — ABNORMAL LOW (ref 32.0–36.0)
MONO%: 15 % — ABNORMAL HIGH (ref 0.0–14.0)
NEUT%: 40.7 % (ref 39.0–75.0)
Platelets: 451 10*3/uL — ABNORMAL HIGH (ref 140–400)

## 2011-11-20 NOTE — Telephone Encounter (Signed)
Gave pt calendar for February and March 2013

## 2011-11-20 NOTE — Progress Notes (Signed)
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 He has been on prednisone 5 mg qod since Jan 14 (on steroids since Aug 2012 initially at much higher doses).   History is of GIST initially disgnosed 2007, with subtotal gastrectomy with Roux-en-Y, then briefly on study with Gleevec which he did not tolerate due to marked elevations in LFTs. He had recurrent GIST with further surgery by Dr.Levine March 2011, then rapid elevation of LFTs with 50% dosing (200 mg/d) gleevec, with gleevec again discontinued after short period in 2011. He had further progression in June 2012, has tolerated gleevec at 100mg /d with prednisone, with adequate shrinkage of tumor to allow surgery by Dr. Lenis Noon Sep 27, 2011. At laparotomy he had extensive adhesions but no gross peritoneal, hepatic or bowel involvement. The mass itself was cystic and soft and not densely dherent to any visceral structures. Pathology([facility] [phone]) recurrent GIST 8.9 cm with resection margins not involved and comment that microscopically this exhibits an epithelial and glandular growth pattern which differs from previous specimens; nuclei medium-sized and bland; immunohistochemical panel postive for c-kit. No mitotic index is reported.  Henry [name] reports no problems since he was here last. He denies fatigue on the tapering steroids, has not been eating and drinking as regularly as he should while working recently. He has no abdominal or other pain. He does continue oral iron, tho it is doubtful that he is absorbing this due to previous surgery. He has had no fever or symptoms of infection, no bleeding, no cough or clear respiratory symptoms. I have discussed previous hives to [facility] with our pharmacist before  this visit. He suggests IV iron dextran with test dose and Henry [name] is in agreement with this plan, as hemoglobin continues to drop despite attempted oral iron. As chemistries have remained good, I will also change labs to every other week for now and may extend interval further as we go.  Full 10 point Review of Systems otherwise negative.   Objective:  Vital signs in last 24 hours:  BP 117/73  Pulse 66  Temp(Src) 98 F (36.7 C) (Oral)  Ht 6' 0.5" (1.842 m)  Wt 133 lb 3.2 oz (60.419 kg)  BMI 17.82 kg/m2  Alert, looks comfortable, quickly ambulatory.  HEENT:mucous membranes moist, pharynx normal without lesions. Oral mucosa somewhat pale. PERRL, not icteric LymphaticsCervical, supraclavicular, and axillary nodes normal. Resp: clear to auscultation bilaterally and normal percussion bilaterally Cardio: regular rate and rhythm GI: soft, nontender, not distended, no apparent masses, surgical incisions well healed. Extremities: extremities normal, atraumatic, no cyanosis or edema Skin without rash or ecchymoses  Lab Results:   Basename 11/20/11 1216  WBC 4.8  HGB 7.7*  HCT 25.8*  PLT 451*   MCV lower at 71, RDW 18 BMET  Basename 11/20/11 1216  NA 140  K 3.9  CL 105  CO2 26  GLUCOSE 83  BUN 8  CREATININE 0.78  CALCIUM 8.3*   Full CMET with T bili 1.0, AP 24, alb 3.7, Tprot 6, all LFTs normal. Studies/Results:  No results found.  Medications: I have reviewed the patient's current medications. Will continue prednisone 5 mg QOD for now.  Assessment/Plan:  1. GIST: history as above. Has had good response to preop gleevec at reduced dose and now is continuing maintenance gleevec.  Tolerating prednisone taper without increase in LFTs. 2.Progressive anemia: iron deficient related to multiple blood draws, surgery and inadequate oral absorbtion of iron. Previous significant, full body hives with feraheme. Plan IV iron dextran with test dose as above; pharmacist assisting  with dosing. 3. 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        Reece Packer, MD   11/20/2011, 8:17 PM

## 2011-11-21 ENCOUNTER — Other Ambulatory Visit: Payer: Self-pay

## 2011-11-21 DIAGNOSIS — D649 Anemia, unspecified: Secondary | ICD-10-CM

## 2011-11-21 MED ORDER — INTEGRA 62.5-62.5-40-3 MG PO CAPS: 1.0000 | ORAL_CAPSULE | Freq: Every day | ORAL | Status: DC

## 2011-11-25 ENCOUNTER — Other Ambulatory Visit: Payer: Self-pay | Admitting: Oncology

## 2011-11-26 ENCOUNTER — Encounter: Payer: Self-pay | Admitting: Oncology

## 2011-11-26 ENCOUNTER — Other Ambulatory Visit: Payer: Self-pay | Admitting: Oncology

## 2011-11-26 DIAGNOSIS — D649 Anemia, unspecified: Secondary | ICD-10-CM

## 2011-11-26 NOTE — Progress Notes (Unsigned)
Per [facility] pharmacist, test dose infed 50 mg and full dose infed 1650 mg, premed with tylenol and benadryl.

## 2011-12-03 ENCOUNTER — Other Ambulatory Visit (HOSPITAL_BASED_OUTPATIENT_CLINIC_OR_DEPARTMENT_OTHER): Payer: Managed Care, Other (non HMO)

## 2011-12-03 DIAGNOSIS — C49A0 Gastrointestinal stromal tumor, unspecified site: Secondary | ICD-10-CM

## 2011-12-03 DIAGNOSIS — C494 Malignant neoplasm of connective and soft tissue of abdomen: Secondary | ICD-10-CM

## 2011-12-03 LAB — CBC WITH DIFFERENTIAL/PLATELET
BASO%: 2 % (ref 0.0–2.0)
EOS%: 1.6 % (ref 0.0–7.0)
HCT: 27.6 % — ABNORMAL LOW (ref 38.4–49.9)
LYMPH%: 37.7 % (ref 14.0–49.0)
MCH: 21.2 pg — ABNORMAL LOW (ref 27.2–33.4)
MCHC: 29.7 g/dL — ABNORMAL LOW (ref 32.0–36.0)
MONO#: 0.7 10*3/uL (ref 0.1–0.9)
NEUT%: 45.4 % (ref 39.0–75.0)
Platelets: 375 10*3/uL (ref 140–400)
RBC: 3.87 10*6/uL — ABNORMAL LOW (ref 4.20–5.82)
WBC: 5.5 10*3/uL (ref 4.0–10.3)

## 2011-12-03 LAB — COMPREHENSIVE METABOLIC PANEL
ALT: 24 U/L (ref 0–53)
AST: 22 U/L (ref 0–37)
Alkaline Phosphatase: 26 U/L — ABNORMAL LOW (ref 39–117)
CO2: 26 mEq/L (ref 19–32)
Creatinine, Ser: 0.82 mg/dL (ref 0.50–1.35)
Sodium: 143 mEq/L (ref 135–145)
Total Bilirubin: 0.8 mg/dL (ref 0.3–1.2)
Total Protein: 5.8 g/dL — ABNORMAL LOW (ref 6.0–8.3)

## 2011-12-06 ENCOUNTER — Other Ambulatory Visit: Payer: Self-pay | Admitting: *Deleted

## 2011-12-08 ENCOUNTER — Other Ambulatory Visit: Payer: Self-pay | Admitting: Oncology

## 2011-12-08 DIAGNOSIS — D509 Iron deficiency anemia, unspecified: Secondary | ICD-10-CM

## 2011-12-08 MED ORDER — METHYLPREDNISOLONE SODIUM SUCC 40 MG IJ SOLR: 40.0000 mg | INTRAMUSCULAR | Status: DC

## 2011-12-08 MED ORDER — DIPHENHYDRAMINE HCL 50 MG/ML IJ SOLN: 25.0000 mg | Freq: Once | INTRAMUSCULAR | Status: DC

## 2011-12-08 MED ORDER — ACETAMINOPHEN 325 MG PO TABS: 650.0000 mg | ORAL_TABLET | Freq: Four times a day (QID) | ORAL | Status: DC | PRN

## 2011-12-08 MED ORDER — SODIUM CHLORIDE 0.9 % IV SOLN: 25.0000 mg | Freq: Once | INTRAVENOUS | Status: DC

## 2011-12-08 MED ORDER — SODIUM CHLORIDE 0.9 % IV SOLN: 1650.0000 mg | Freq: Once | INTRAVENOUS | Status: DC

## 2011-12-10 ENCOUNTER — Ambulatory Visit (HOSPITAL_BASED_OUTPATIENT_CLINIC_OR_DEPARTMENT_OTHER): Payer: Managed Care, Other (non HMO)

## 2011-12-10 DIAGNOSIS — C494 Malignant neoplasm of connective and soft tissue of abdomen: Secondary | ICD-10-CM

## 2011-12-10 DIAGNOSIS — E538 Deficiency of other specified B group vitamins: Secondary | ICD-10-CM

## 2011-12-10 DIAGNOSIS — D649 Anemia, unspecified: Secondary | ICD-10-CM

## 2011-12-10 DIAGNOSIS — C49A0 Gastrointestinal stromal tumor, unspecified site: Secondary | ICD-10-CM

## 2011-12-10 MED ORDER — SODIUM CHLORIDE 0.9 % IV SOLN
50.0000 mg | Freq: Once | INTRAVENOUS | Status: AC
  Administered 2011-12-10: 50 mg via INTRAVENOUS
  Filled 2011-12-10: qty 1

## 2011-12-10 MED ORDER — DIPHENHYDRAMINE HCL 25 MG PO CAPS
50.0000 mg | ORAL_CAPSULE | Freq: Once | ORAL | Status: AC
  Administered 2011-12-10: 50 mg via ORAL

## 2011-12-10 MED ORDER — IRON DEXTRAN 50 MG/ML IJ SOLN
1650.0000 mg | Freq: Once | INTRAMUSCULAR | Status: AC
  Administered 2011-12-10: 1650 mg via INTRAVENOUS
  Filled 2011-12-10: qty 33

## 2011-12-10 MED ORDER — CYANOCOBALAMIN 1000 MCG/ML IJ SOLN
1000.0000 ug | Freq: Once | INTRAMUSCULAR | Status: AC
  Administered 2011-12-10: 1000 ug via INTRAMUSCULAR

## 2011-12-10 MED ORDER — ACETAMINOPHEN 325 MG PO TABS
650.0000 mg | ORAL_TABLET | Freq: Once | ORAL | Status: AC
  Administered 2011-12-10: 650 mg via ORAL

## 2011-12-17 ENCOUNTER — Other Ambulatory Visit (HOSPITAL_BASED_OUTPATIENT_CLINIC_OR_DEPARTMENT_OTHER): Payer: Managed Care, Other (non HMO)

## 2011-12-17 DIAGNOSIS — C49A0 Gastrointestinal stromal tumor, unspecified site: Secondary | ICD-10-CM

## 2011-12-17 DIAGNOSIS — D518 Other vitamin B12 deficiency anemias: Secondary | ICD-10-CM

## 2011-12-17 LAB — COMPREHENSIVE METABOLIC PANEL
ALT: 27 U/L (ref 0–53)
AST: 26 U/L (ref 0–37)
Alkaline Phosphatase: 28 U/L — ABNORMAL LOW (ref 39–117)
Chloride: 105 mEq/L (ref 96–112)
Creatinine, Ser: 0.89 mg/dL (ref 0.50–1.35)
Total Bilirubin: 0.6 mg/dL (ref 0.3–1.2)

## 2011-12-17 LAB — CBC WITH DIFFERENTIAL/PLATELET
BASO%: 1.1 % (ref 0.0–2.0)
EOS%: 1.4 % (ref 0.0–7.0)
LYMPH%: 26.6 % (ref 14.0–49.0)
MCH: 22.5 pg — ABNORMAL LOW (ref 27.2–33.4)
MCHC: 30.4 g/dL — ABNORMAL LOW (ref 32.0–36.0)
MONO#: 0.7 10*3/uL (ref 0.1–0.9)
MONO%: 9.6 % (ref 0.0–14.0)
Platelets: 454 10*3/uL — ABNORMAL HIGH (ref 140–400)
RBC: 4.44 10*6/uL (ref 4.20–5.82)
WBC: 7.4 10*3/uL (ref 4.0–10.3)
nRBC: 1 % — ABNORMAL HIGH (ref 0–0)

## 2011-12-18 ENCOUNTER — Telehealth: Payer: Self-pay

## 2011-12-18 NOTE — Telephone Encounter (Signed)
SPOKE WITH [name] AND TOLD HIM THAT HIS HGB WAS MUCH BETTER AFTER THE IV IRON AT 10.0.  HE WILL HOLD OFF ON THE ORAL IRON SINCE IT WAS NOT EFFECTIVE.  TOLD HIM THAT HIS KCL WAS 5.0 ON 12-17-11 SO DR. Darrold Span SAID TO DC THE PO KCL SUPPLEMENT.  PT. VERBALIZED UNDERSTANDING.

## 2012-01-07 ENCOUNTER — Other Ambulatory Visit: Payer: Managed Care, Other (non HMO)

## 2012-01-07 ENCOUNTER — Ambulatory Visit: Payer: Managed Care, Other (non HMO) | Admitting: Oncology

## 2012-01-07 ENCOUNTER — Telehealth: Payer: Self-pay

## 2012-01-07 NOTE — Progress Notes (Signed)
Patient out of town and unable to keep scheduled MD + lab today. Will reschedule to PA + lab on 01-11-12, as this date is possible for him. No charge today.

## 2012-01-07 NOTE — Telephone Encounter (Signed)
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 AND WILL NOT BE ABLE TO MAKE APPT. THIS AM.  HE WILL BE BACK IN TOWN AND COULD COME IN TO THE OFFICE 3-22; 3-25 THRU 3-28 THEN OUT OF TOWN.

## 2012-01-07 NOTE — Telephone Encounter (Signed)
DR. Darrold Span SENT AN ELECTRONIC POF TO SCHEDULERS TO RESCHEDULE PT.'S APPT.

## 2012-01-09 ENCOUNTER — Telehealth: Payer: Self-pay | Admitting: Oncology

## 2012-01-09 NOTE — Telephone Encounter (Signed)
S/w pt re appt for 3/22 @ 10:15 am.

## 2012-01-11 ENCOUNTER — Telehealth: Payer: Self-pay | Admitting: Oncology

## 2012-01-11 ENCOUNTER — Ambulatory Visit (HOSPITAL_BASED_OUTPATIENT_CLINIC_OR_DEPARTMENT_OTHER): Payer: Managed Care, Other (non HMO) | Admitting: Physician Assistant

## 2012-01-11 ENCOUNTER — Other Ambulatory Visit (HOSPITAL_BASED_OUTPATIENT_CLINIC_OR_DEPARTMENT_OTHER): Payer: Managed Care, Other (non HMO)

## 2012-01-11 ENCOUNTER — Encounter: Payer: Self-pay | Admitting: Physician Assistant

## 2012-01-11 VITALS — BP 110/65 | HR 74 | Temp 97.8°F | Ht 72.5 in | Wt 131.8 lb

## 2012-01-11 DIAGNOSIS — C169 Malignant neoplasm of stomach, unspecified: Secondary | ICD-10-CM

## 2012-01-11 DIAGNOSIS — E538 Deficiency of other specified B group vitamins: Secondary | ICD-10-CM

## 2012-01-11 DIAGNOSIS — C49A0 Gastrointestinal stromal tumor, unspecified site: Secondary | ICD-10-CM

## 2012-01-11 DIAGNOSIS — D649 Anemia, unspecified: Secondary | ICD-10-CM

## 2012-01-11 LAB — CBC WITH DIFFERENTIAL/PLATELET
BASO%: 1.1 % (ref 0.0–2.0)
Basophils Absolute: 0.1 10*3/uL (ref 0.0–0.1)
EOS%: 2.4 % (ref 0.0–7.0)
Eosinophils Absolute: 0.1 10*3/uL (ref 0.0–0.5)
HCT: 36.2 % — ABNORMAL LOW (ref 38.4–49.9)
HGB: 11.5 g/dL — ABNORMAL LOW (ref 13.0–17.1)
LYMPH%: 43.1 % (ref 14.0–49.0)
MCH: 25.7 pg — ABNORMAL LOW (ref 27.2–33.4)
MCHC: 31.8 g/dL — ABNORMAL LOW (ref 32.0–36.0)
MCV: 80.8 fL (ref 79.3–98.0)
MONO#: 0.6 10*3/uL (ref 0.1–0.9)
MONO%: 10.5 % (ref 0.0–14.0)
NEUT#: 2.3 10*3/uL (ref 1.5–6.5)
NEUT%: 42.9 % (ref 39.0–75.0)
Platelets: 234 10*3/uL (ref 140–400)
RBC: 4.48 10*6/uL (ref 4.20–5.82)
WBC: 5.4 10*3/uL (ref 4.0–10.3)
lymph#: 2.3 10*3/uL (ref 0.9–3.3)
nRBC: 0 % (ref 0–0)

## 2012-01-11 LAB — COMPREHENSIVE METABOLIC PANEL WITH GFR
ALT: 23 U/L (ref 0–53)
AST: 26 U/L (ref 0–37)
Albumin: 4 g/dL (ref 3.5–5.2)
Alkaline Phosphatase: 28 U/L — ABNORMAL LOW (ref 39–117)
BUN: 11 mg/dL (ref 6–23)
CO2: 30 meq/L (ref 19–32)
Calcium: 9.1 mg/dL (ref 8.4–10.5)
Chloride: 106 meq/L (ref 96–112)
Creatinine, Ser: 0.96 mg/dL (ref 0.50–1.35)
Glucose, Bld: 78 mg/dL (ref 70–99)
Potassium: 3.8 meq/L (ref 3.5–5.3)
Sodium: 142 meq/L (ref 135–145)
Total Bilirubin: 0.6 mg/dL (ref 0.3–1.2)
Total Protein: 6.2 g/dL (ref 6.0–8.3)

## 2012-01-11 LAB — TECHNOLOGIST REVIEW

## 2012-01-11 NOTE — Telephone Encounter (Signed)
pts appts made,pt states he will be out of town and Dimas [place] is aware.   aom

## 2012-01-13 NOTE — Progress Notes (Signed)
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   History is of GIST initially disgnosed 2007, with subtotal gastrectomy with Roux-en-Y, then briefly on study with Gleevec which he did not tolerate due to marked elevations in LFTs. He had recurrent GIST in late 2012 with further surgery by Dr.Levine March 2011, then rapid elevation of LFTs with 50% dosing (200 mg/d) gleevec, with gleevec again discontinued after short period in 2011.  He had further progression in June 2012, has tolerated gleevec at 100mg /d with prednisone, with adequate shrinkage of tumor to allow surgery by Dr. Lenis Noon Sep 27, 2011. I have requested and received the operative note and pathology report from [facility], which will be scanned into this EMR. To summarize, at laparotomy he had extensive adhesions but no gross peritoneal, hepatic or bowel involvement. The mass itself was cystic and soft and not densely adherent to any visceral structures; estimated blood loss was 200cc. Pathology([facility] [phone]) recurrent GIST 8.9 cm with resection margins not involved and comment that microscopically this exhibits an epithelial and glandular growth pattern which differs from previous specimens; nuclei medium-sized and bland; immunohistochemical panel postive for c-kit. No mitotic index is reported.  Mr.Yurick was discharged on Sep 30, 2011, saw Dr.Levine in follow up and understands that he should have repeat scans in ~ 3 - 4 months, possibly in [place].  He has been recovering well from the surgery, resumed Gleevec and oral prednisone at discharge, is no longer using any pain medications.   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 He is currently taking prednisone 5 mg every other day and has discontinued potassium as well as the oral iron. Review of Syslems otherwise:  Back to essentially full activity, traveling and teaching . No pain. No noted bleeding. Bowels ok. No SOB. Has been using Integra intermittently. No cardiac symptoms. No swelling LE. No mucositis symptoms. No reflux. Remainder of 10 point ROS negative.  Objective:  Vital signs in last 24 hours:  BP 110/65  Pulse 74  Temp(Src) 97.8 F (36.6 C) (Oral)  Ht 6' 0.5" (1.842 m)  Wt 131 lb 12.8 oz (59.784 kg)  BMI 17.63 kg/m2 This weight is down from 136.5 lbs in Nov.    HEENT:mucous membranes moist, pharynx normal without lesions. Mucous membranes somewhat pale. PERRL, not icteric. No JVD. LymphaticsCervical, supraclavicular, and axillary nodes normal. Resp: clear to auscultation bilaterally and normal percussion bilaterally Cardio: regular rate and rhythm GI: midline incision appears healed without tenderness, erythema. Abd soft, not distended, nontender. Extremities: no clubbing, cyanosis, edema, cords, tenderness Skin not remarkable Neuro: appropriate affect and speech    Lab Results:  CBC today: WBC 5.4, ANC 2.3, Hgb 11.5   plt 234K, MCV 80.8 BMET/CMET -Pending   Studies/Results:  No results found.  Medications: I have reviewed the patient's current medications. He will discontinue the prednisone and continue gleevec 100 mg daily;    Assessment/Plan:  1. GIST: initial diagnosis and surgery in 2007, then recurrence resected in 2011 and most recent recurrence resected by Dr.Levine 09-27-2011. He has tolerated gleevec at 100  mg/day with prednisone, with clear benefit of the GIST and no hepatitis problems. The patient was discussed with Dr. Darrold Span. He will have CBC, Differential andCMET in 2 weeks and  again in 4 weeks with his next followup with Dr. Darrold Span.  2.Progressive anemia: multifactorial with blood draws/surgical blood loss, inability to absorb significant oral iron due to gastrectomy etc, possibly some contribution of the gleevec; he is continuing B12 injection monthly. Note he had severe rash to [facility] previously, and if IV iron attempted again would need a different preparation. As stated above he has discontinued his oral iron pills. We will continue to monitor his CBC closely.  Patient was comfortable with discussion and in agreement with plan.        Tiana Loft E, PA-C   01/13/2012, 12:53 AM

## 2012-01-17 ENCOUNTER — Other Ambulatory Visit: Payer: Managed Care, Other (non HMO)

## 2012-01-24 ENCOUNTER — Ambulatory Visit (HOSPITAL_BASED_OUTPATIENT_CLINIC_OR_DEPARTMENT_OTHER): Payer: Managed Care, Other (non HMO) | Admitting: Lab

## 2012-01-24 DIAGNOSIS — C169 Malignant neoplasm of stomach, unspecified: Secondary | ICD-10-CM

## 2012-01-24 LAB — CBC WITH DIFFERENTIAL/PLATELET
Basophils Absolute: 0.1 10*3/uL (ref 0.0–0.1)
EOS%: 2 % (ref 0.0–7.0)
HCT: 39.6 % (ref 38.4–49.9)
HGB: 12.6 g/dL — ABNORMAL LOW (ref 13.0–17.1)
LYMPH%: 41.6 % (ref 14.0–49.0)
MCHC: 31.8 g/dL — ABNORMAL LOW (ref 32.0–36.0)
MONO#: 0.6 10*3/uL (ref 0.1–0.9)
MONO%: 10.3 % (ref 0.0–14.0)
NEUT%: 44.7 % (ref 39.0–75.0)
WBC: 5.3 10*3/uL (ref 4.0–10.3)
lymph#: 2.2 10*3/uL (ref 0.9–3.3)
nRBC: 0 % (ref 0–0)

## 2012-01-24 LAB — COMPREHENSIVE METABOLIC PANEL
ALT: 29 U/L (ref 0–53)
AST: 28 U/L (ref 0–37)
Albumin: 4 g/dL (ref 3.5–5.2)
Alkaline Phosphatase: 32 U/L — ABNORMAL LOW (ref 39–117)
BUN: 8 mg/dL (ref 6–23)
CO2: 30 mEq/L (ref 19–32)
Calcium: 9.1 mg/dL (ref 8.4–10.5)
Chloride: 105 mEq/L (ref 96–112)
Creatinine, Ser: 0.84 mg/dL (ref 0.50–1.35)
Glucose, Bld: 143 mg/dL — ABNORMAL HIGH (ref 70–99)
Potassium: 3.7 mEq/L (ref 3.5–5.3)
Sodium: 142 mEq/L (ref 135–145)
Total Bilirubin: 0.9 mg/dL (ref 0.3–1.2)
Total Protein: 6.3 g/dL (ref 6.0–8.3)

## 2012-01-25 ENCOUNTER — Telehealth: Payer: Self-pay | Admitting: *Deleted

## 2012-01-25 NOTE — Telephone Encounter (Signed)
Per Dr Cleophas Dunker, okay to inform pt that labs look good.  Pt verbalized understanding.  SLJ

## 2012-02-12 ENCOUNTER — Ambulatory Visit (HOSPITAL_BASED_OUTPATIENT_CLINIC_OR_DEPARTMENT_OTHER): Payer: Managed Care, Other (non HMO) | Admitting: Oncology

## 2012-02-12 ENCOUNTER — Other Ambulatory Visit (HOSPITAL_BASED_OUTPATIENT_CLINIC_OR_DEPARTMENT_OTHER): Payer: Managed Care, Other (non HMO)

## 2012-02-12 ENCOUNTER — Telehealth: Payer: Self-pay | Admitting: Oncology

## 2012-02-12 VITALS — BP 107/70 | HR 62 | Temp 97.7°F | Ht 72.5 in | Wt 131.5 lb

## 2012-02-12 DIAGNOSIS — C649 Malignant neoplasm of unspecified kidney, except renal pelvis: Secondary | ICD-10-CM

## 2012-02-12 DIAGNOSIS — D509 Iron deficiency anemia, unspecified: Secondary | ICD-10-CM

## 2012-02-12 DIAGNOSIS — C169 Malignant neoplasm of stomach, unspecified: Secondary | ICD-10-CM

## 2012-02-12 DIAGNOSIS — E538 Deficiency of other specified B group vitamins: Secondary | ICD-10-CM

## 2012-02-12 DIAGNOSIS — C49A0 Gastrointestinal stromal tumor, unspecified site: Secondary | ICD-10-CM

## 2012-02-12 LAB — CBC WITH DIFFERENTIAL/PLATELET
Eosinophils Absolute: 0.1 10*3/uL (ref 0.0–0.5)
HCT: 40 % (ref 38.4–49.9)
LYMPH%: 42.2 % (ref 14.0–49.0)
MCV: 85.5 fL (ref 79.3–98.0)
MONO#: 0.6 10*3/uL (ref 0.1–0.9)
MONO%: 11.2 % (ref 0.0–14.0)
NEUT#: 2.3 10*3/uL (ref 1.5–6.5)
NEUT%: 43.2 % (ref 39.0–75.0)
Platelets: 230 10*3/uL (ref 140–400)
WBC: 5.2 10*3/uL (ref 4.0–10.3)

## 2012-02-12 LAB — COMPREHENSIVE METABOLIC PANEL
BUN: 11 mg/dL (ref 6–23)
CO2: 31 mEq/L (ref 19–32)
Calcium: 8.9 mg/dL (ref 8.4–10.5)
Chloride: 104 mEq/L (ref 96–112)
Creatinine, Ser: 0.98 mg/dL (ref 0.50–1.35)
Glucose, Bld: 106 mg/dL — ABNORMAL HIGH (ref 70–99)
Total Bilirubin: 1 mg/dL (ref 0.3–1.2)

## 2012-02-12 LAB — TECHNOLOGIST REVIEW

## 2012-02-12 MED ORDER — CYANOCOBALAMIN 1000 MCG/ML IJ SOLN
1000.0000 ug | Freq: Once | INTRAMUSCULAR | Status: AC
  Administered 2012-02-12: 1000 ug via INTRAMUSCULAR

## 2012-02-12 NOTE — Progress Notes (Signed)
OFFICE PROGRESS NOTE Date of Visit 02-12-2012 Physicians: Y.Lowne, E.Levine, P.Savage, J.Perry, T.Cornett, S.Dahlstedt   INTERVAL HISTORY:  Patient is seen, alone for visit, in continuing attention to his recurrent GIST, continuing gleevec which he is tolerating at 100 mg daily. He has tapered down and off prednisone as of 01-13-12. He has had no obvious problems from gleevec or adrenal insufficiency since stopping steroids.  History is of GIST initially disgnosed 2007, with subtotal gastrectomy with Roux-en-Y, then briefly on study with Gleevec which he did not tolerate due to marked elevations in LFTs. He had recurrent GIST in late 2012 with further surgery by Dr.Levine March 2011, then rapid elevation of LFTs with 50% dosing (200 mg/d) gleevec, with gleevec again discontinued after short period in 2011. He had further progression in June 2012, has tolerated gleevec at 100mg /d with prednisone, with adequate shrinkage of tumor to allow surgery by Dr. Lenis Noon Sep 27, 2011.  At laparotomy he had extensive adhesions but no gross peritoneal, hepatic or bowel involvement. The mass itself was cystic and soft and not densely adherent to any visceral structures; estimated blood loss was 200cc. Pathology([facility] [phone]) recurrent GIST 8.9 cm with resection margins not involved and comment that microscopically this exhibits an epithelial and glandular growth pattern which differs from previous specimens; nuclei medium-sized and bland; immunohistochemical panel postive for c-kit. No mitotic index is reported. Henry [name] was discharged on Sep 30, 2011, saw Dr.Levine in follow up 10-19-11  and understands that he should have repeat scans in ~ 3 - 4 months, possibly in [place]. He is not aware of any additiional appointments scheduled at [facility] now.   Henry [name] continues B12 injections, necessary due to gastric/ bowel resections; per EMR, his last B12 was 12-10-2011, tho this optimally should be monthly. He has  been iron deficient related to surgeries and  blood draws, previously had marked skin reaction to feraheme, but did tolerate inferon on 12-10-2011 with no difficulty. His hemoglobin rapidly improved since the inferon, tho he is not clearly aware of symptomatic improvement.   Oncologic history is also significant for T1a renal cell carcinoma treated with partial right nephrectomy in 2008.  Henry [name] tells me that he has been feeling well, with good energy and appetite (frequent small meals), no pain, no respiratory or different GI problems, no noted bleeding. He has had no recent illness.  Objective:  Vital signs in last 24 hours:  BP 107/70  Pulse 62  Temp(Src) 97.7 F (36.5 C) (Oral)  Ht 6' 0.5" (1.842 m)  Wt 131 lb 8 oz (59.648 kg)  BMI 17.59 kg/m2 Weight is stable. Easily and quickly mobile.  HEENT:mucous membranes moist, pharynx normal without lesions. PERRL< not icteric. No JVD. Not pale. LymphaticsCervical, supraclavicular, and axillary nodes normal. Resp: clear to auscultation bilaterally and normal percussion bilaterally Cardio: regular rate and rhythm GI: soft, nontender, not distended, surgical incisions well healed, nothing palpable Extremities: extremities normal, atraumatic, no cyanosis or edema Neuro:nonfocal.  Skin without rash  Lab Results:   Basename 02/12/12 0912  WBC 5.2  HGB 13.3  HCT 40.0  PLT 230  MCV 85.5, up from 71 prior to IV iron  BMET  Basename 02/12/12 0912  NA 142  K 3.9  CL 104  CO2 31  GLUCOSE 106*  BUN 11  CREATININE 0.98  CALCIUM 8.9  remainder of full CMET normal with exception of low AP at 30, unchanged.  Studies/Results: Patient agrees with recommendation for CT abdomen, which will be scheduled  Medications: I have  reviewed the patient's current medications. He stopped prednisone ~ 01-13-12 and is off oral iron (as not absorbing). B12 injection given today.  Assessment/Plan: 1.GIST: post second resection of recurrent disease  in Dec 2012, continuing gleevec, which he is tolerating well at reduced dose now off steroids. Will get CT abdomen prior to next visit ~ 8 wks. 2. 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 History ? Bipolar disorder 7. Past tobacco, DCd ~ 1990  LIVESAY,LENNIS P, MD   02/12/2012, 7:19 PM

## 2012-02-12 NOTE — Patient Instructions (Signed)
Will add additional chemistries if needed after today's chemistries result

## 2012-02-12 NOTE — Telephone Encounter (Signed)
appts made and printed for pt aom °

## 2012-02-13 ENCOUNTER — Encounter: Payer: Self-pay | Admitting: Oncology

## 2012-02-13 ENCOUNTER — Telehealth: Payer: Self-pay | Admitting: *Deleted

## 2012-02-13 NOTE — Telephone Encounter (Signed)
Notified pt per Dr. Darrold Span that "02/12/12 chemistries look fine, alk phos slightly low as it has been, not a problem & no elevation in any LFT's off the prednisone."  He expressed understanding & appreciation for call.

## 2012-03-05 ENCOUNTER — Other Ambulatory Visit: Payer: Self-pay

## 2012-03-05 DIAGNOSIS — C169 Malignant neoplasm of stomach, unspecified: Secondary | ICD-10-CM

## 2012-03-05 MED ORDER — IMATINIB MESYLATE 100 MG PO TABS: 100.0000 mg | ORAL_TABLET | Freq: Every day | ORAL | Status: DC

## 2012-03-10 ENCOUNTER — Ambulatory Visit (HOSPITAL_COMMUNITY)
Admission: RE | Admit: 2012-03-10 | Discharge: 2012-03-10 | Disposition: A | Discharge status: Home / Self Care | Payer: Managed Care, Other (non HMO) | Source: Ambulatory Visit | Attending: Oncology | Admitting: Oncology

## 2012-03-10 DIAGNOSIS — K7689 Other specified diseases of liver: Secondary | ICD-10-CM | POA: Insufficient documentation

## 2012-03-10 DIAGNOSIS — C49A0 Gastrointestinal stromal tumor, unspecified site: Secondary | ICD-10-CM

## 2012-03-10 DIAGNOSIS — N289 Disorder of kidney and ureter, unspecified: Secondary | ICD-10-CM | POA: Insufficient documentation

## 2012-03-10 DIAGNOSIS — Z905 Acquired absence of kidney: Secondary | ICD-10-CM | POA: Insufficient documentation

## 2012-03-10 DIAGNOSIS — I7090 Unspecified atherosclerosis: Secondary | ICD-10-CM | POA: Insufficient documentation

## 2012-03-10 DIAGNOSIS — C494 Malignant neoplasm of connective and soft tissue of abdomen: Secondary | ICD-10-CM | POA: Insufficient documentation

## 2012-03-10 DIAGNOSIS — Z9089 Acquired absence of other organs: Secondary | ICD-10-CM | POA: Insufficient documentation

## 2012-03-10 DIAGNOSIS — Z903 Acquired absence of stomach [part of]: Secondary | ICD-10-CM | POA: Insufficient documentation

## 2012-03-10 MED ORDER — IOHEXOL 300 MG/ML  SOLN
100.0000 mL | Freq: Once | INTRAMUSCULAR | Status: AC | PRN
  Administered 2012-03-10: 100 mL via INTRAVENOUS

## 2012-03-25 ENCOUNTER — Telehealth: Payer: Self-pay | Admitting: *Deleted

## 2012-03-25 NOTE — Telephone Encounter (Signed)
Per Dr Darrold Span request, pt notified of CT results. "CT showed nothing that we can tell is recurrent cancer"

## 2012-04-08 ENCOUNTER — Telehealth: Payer: Self-pay | Admitting: Oncology

## 2012-04-08 ENCOUNTER — Ambulatory Visit (HOSPITAL_BASED_OUTPATIENT_CLINIC_OR_DEPARTMENT_OTHER): Payer: Managed Care, Other (non HMO)

## 2012-04-08 ENCOUNTER — Other Ambulatory Visit (HOSPITAL_BASED_OUTPATIENT_CLINIC_OR_DEPARTMENT_OTHER): Payer: Managed Care, Other (non HMO)

## 2012-04-08 ENCOUNTER — Ambulatory Visit (HOSPITAL_BASED_OUTPATIENT_CLINIC_OR_DEPARTMENT_OTHER): Payer: Managed Care, Other (non HMO) | Admitting: Oncology

## 2012-04-08 ENCOUNTER — Encounter: Payer: Self-pay | Admitting: Oncology

## 2012-04-08 VITALS — BP 130/87 | HR 63 | Temp 97.2°F | Ht 72.5 in | Wt 131.3 lb

## 2012-04-08 DIAGNOSIS — C494 Malignant neoplasm of connective and soft tissue of abdomen: Secondary | ICD-10-CM

## 2012-04-08 DIAGNOSIS — C49A0 Gastrointestinal stromal tumor, unspecified site: Secondary | ICD-10-CM

## 2012-04-08 DIAGNOSIS — Z9089 Acquired absence of other organs: Secondary | ICD-10-CM

## 2012-04-08 DIAGNOSIS — E538 Deficiency of other specified B group vitamins: Secondary | ICD-10-CM

## 2012-04-08 DIAGNOSIS — Z9081 Acquired absence of spleen: Secondary | ICD-10-CM

## 2012-04-08 LAB — CBC WITH DIFFERENTIAL/PLATELET
BASO%: 1.5 % (ref 0.0–2.0)
Eosinophils Absolute: 0.1 10*3/uL (ref 0.0–0.5)
LYMPH%: 42.2 % (ref 14.0–49.0)
MCHC: 33.5 g/dL (ref 32.0–36.0)
MCV: 91.2 fL (ref 79.3–98.0)
MONO%: 12 % (ref 0.0–14.0)
NEUT%: 41.8 % (ref 39.0–75.0)
Platelets: 221 10*3/uL (ref 140–400)
RBC: 4.4 10*6/uL (ref 4.20–5.82)

## 2012-04-08 LAB — COMPREHENSIVE METABOLIC PANEL
Alkaline Phosphatase: 32 U/L — ABNORMAL LOW (ref 39–117)
Creatinine, Ser: 0.87 mg/dL (ref 0.50–1.35)
Glucose, Bld: 107 mg/dL — ABNORMAL HIGH (ref 70–99)
Sodium: 140 mEq/L (ref 135–145)
Total Bilirubin: 1.6 mg/dL — ABNORMAL HIGH (ref 0.3–1.2)
Total Protein: 6 g/dL (ref 6.0–8.3)

## 2012-04-08 MED ORDER — CYANOCOBALAMIN 1000 MCG/ML IJ SOLN
1000.0000 ug | Freq: Once | INTRAMUSCULAR | Status: AC
  Administered 2012-04-08: 1000 ug via INTRAMUSCULAR

## 2012-04-08 NOTE — Telephone Encounter (Signed)
appts made  And printed for pt aom 

## 2012-04-08 NOTE — Patient Instructions (Signed)
B12 monthly

## 2012-04-08 NOTE — Progress Notes (Signed)
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 He had restaging CT AP on 02-29-12, which does not show anything of concern. History is of GIST initially disgnosed 2007, with subtotal gastrectomy with Roux-en-Y, then briefly on study with Gleevec which he did not tolerate due to marked elevations in LFTs. He had recurrent GIST in late 2012 with further surgery by Dr.Levine March 2011, then rapid elevation of LFTs with 50% dosing (200 mg/d) gleevec, with gleevec again discontinued after short period in 2011. He had further progression in June 2012, has tolerated gleevec at 100mg /d with prednisone, with adequate shrinkage of tumor to allow surgery by Dr. Lenis Noon Sep 27, 2011. At laparotomy he had extensive adhesions but no gross peritoneal, hepatic or bowel involvement. The mass itself was cystic and soft and not densely adherent to any visceral structures; estimated blood loss was 200cc. Pathology([facility] [phone]) recurrent GIST 8.9 cm with resection margins not involved and comment that microscopically this exhibits an epithelial and glandular growth pattern which differs from previous specimens; nuclei medium-sized and bland; immunohistochemical panel postive for c-kit. No mitotic index is reported. Mr.Oberle saw Dr.Levine in follow up in late Dec 2012 and does not have any additiional appointments scheduled at [facility] now.  Mr.Hinkley continues B12 injections, necessary due to gastric/ bowel resection. He was iron deficient related to surgeries and blood draws, previously had marked skin reaction to feraheme, but did tolerate inferon on 12-10-2011 with no difficulty. He also has history of T1a renal cell carcinoma, treated with  partial right nephrectomy in 2008 and not active since then.  Mr Degeorge has been feeling well, with no complaints at all today. He does have difficulty maintaining his weight, with goal weight 140, but usually closer to 131. He has not tried Resource, does not like Ensure.  On Review of Systems: no pain, no shortness of breath, no bleeding, bowels ok, no vomiting, good energy, no LE swelling. Remainder of 10 point Review of Systems negative.He is just back from the beach.  Objective:  Vital signs in last 24 hours:  BP 130/87  Pulse 63  Temp 97.2 F (36.2 C) (Oral)  Ht 6' 0.5" (1.842 m)  Wt 131 lb 4.8 oz (59.557 kg)  BMI 17.56 kg/m2 Easily ambulatory, looks comfortable.  HEENT:mucous membranes moist, pharynx normal without lesions LymphaticsCervical, supraclavicular, and axillary nodes normal. and no cervical or supraclavicular adenopathy Resp: clear to auscultation bilaterally and normal percussion bilaterally Cardio: regular rate and rhythm GI: soft, nontender, normal bowel sounds, not distended, liver not palpable Extremities: extremities normal, atraumatic, no cyanosis or edemaNeuro:nonfocal Skin tanned without rash or ecchymosis  Lab Results:   Basename 04/08/12 0923  WBC 4.7  HGB 13.5  HCT 40.1  PLT 221  MCV 91.2  BMET  Basename 04/08/12 0923  NA 140  K 4.0  CL 105  CO2 30  GLUCOSE 107*  BUN 12  CREATININE 0.87  CALCIUM 8.9  Tbili 1.6, AP 32, otherwise full CMET WNL including Tprot 6.0 and alb 3.8  Studies/Results:  CT abdomen 03-10-12: stable low density areas in liver largest 1 cm with fat density, adrenals unremarkable, partial right nephrectomy, 4 mm lesion upper pole left kidney too small to characterize, splenectomy, no adenopathy, no free fluid, bowels ok, lung  bases ok, no bone lesions  Medications: I have reviewed the patient's current medications. He has been given IM B12 today. He is not appropriate to change to oral B12    Assessment/Plan: 1.GIST: history as above, CT not remarkable, continuing gleevec. Will repeat CMET in ~ 2 months and I will see him with CBC/ CMET in 4 months 2.history of renal cell carcinoma as above, nothing obviously of concern on CT 3.B12 deficiency due to inability to absorb from GI tract: monthly IM B12 4.iron deficiency resolved 5. Nutritional status good by labs tho weight is difficult to maintain since GI surgery  Jourdyn Hasler P, MD   04/08/2012, 5:46 PM

## 2012-05-12 ENCOUNTER — Ambulatory Visit (HOSPITAL_BASED_OUTPATIENT_CLINIC_OR_DEPARTMENT_OTHER): Payer: Managed Care, Other (non HMO)

## 2012-05-12 VITALS — BP 114/72 | HR 63 | Temp 97.9°F

## 2012-05-12 DIAGNOSIS — E538 Deficiency of other specified B group vitamins: Secondary | ICD-10-CM

## 2012-05-12 DIAGNOSIS — C49A0 Gastrointestinal stromal tumor, unspecified site: Secondary | ICD-10-CM

## 2012-05-12 MED ORDER — CYANOCOBALAMIN 1000 MCG/ML IJ SOLN
1000.0000 ug | Freq: Once | INTRAMUSCULAR | Status: AC
  Administered 2012-05-12: 1000 ug via INTRAMUSCULAR

## 2012-06-09 ENCOUNTER — Telehealth: Payer: Self-pay | Admitting: Oncology

## 2012-06-09 ENCOUNTER — Ambulatory Visit (HOSPITAL_BASED_OUTPATIENT_CLINIC_OR_DEPARTMENT_OTHER): Payer: Managed Care, Other (non HMO)

## 2012-06-09 ENCOUNTER — Other Ambulatory Visit (HOSPITAL_BASED_OUTPATIENT_CLINIC_OR_DEPARTMENT_OTHER): Payer: Managed Care, Other (non HMO)

## 2012-06-09 VITALS — BP 125/83 | HR 71 | Temp 97.1°F

## 2012-06-09 DIAGNOSIS — C49A0 Gastrointestinal stromal tumor, unspecified site: Secondary | ICD-10-CM

## 2012-06-09 DIAGNOSIS — C494 Malignant neoplasm of connective and soft tissue of abdomen: Secondary | ICD-10-CM

## 2012-06-09 DIAGNOSIS — E538 Deficiency of other specified B group vitamins: Secondary | ICD-10-CM

## 2012-06-09 LAB — COMPREHENSIVE METABOLIC PANEL
AST: 27 U/L (ref 0–37)
Alkaline Phosphatase: 31 U/L — ABNORMAL LOW (ref 39–117)
BUN: 8 mg/dL (ref 6–23)
Creatinine, Ser: 0.9 mg/dL (ref 0.50–1.35)
Potassium: 4.1 mEq/L (ref 3.5–5.3)
Total Bilirubin: 1.3 mg/dL — ABNORMAL HIGH (ref 0.3–1.2)

## 2012-06-09 LAB — CBC WITH DIFFERENTIAL/PLATELET
Basophils Absolute: 0 10*3/uL (ref 0.0–0.1)
EOS%: 2.4 % (ref 0.0–7.0)
HGB: 14.1 g/dL (ref 13.0–17.1)
MCH: 31.4 pg (ref 27.2–33.4)
MCV: 94.3 fL (ref 79.3–98.0)
MONO%: 10.6 % (ref 0.0–14.0)
NEUT%: 50.3 % (ref 39.0–75.0)
RDW: 14.5 % (ref 11.0–14.6)

## 2012-06-09 MED ORDER — CYANOCOBALAMIN 1000 MCG/ML IJ SOLN
1000.0000 ug | Freq: Once | INTRAMUSCULAR | Status: AC
  Administered 2012-06-09: 1000 ug via INTRAMUSCULAR

## 2012-06-09 NOTE — Telephone Encounter (Signed)
Gave pt appt for September and October 2013 lab, injection and MD

## 2012-06-11 ENCOUNTER — Telehealth: Payer: Self-pay | Admitting: *Deleted

## 2012-06-11 NOTE — Telephone Encounter (Signed)
Pt notified that CBC and chemistries are good per Dr Darrold Span and to continue gleevec.

## 2012-07-07 ENCOUNTER — Ambulatory Visit (HOSPITAL_BASED_OUTPATIENT_CLINIC_OR_DEPARTMENT_OTHER): Payer: Managed Care, Other (non HMO)

## 2012-07-07 VITALS — BP 117/76 | HR 64 | Temp 97.4°F

## 2012-07-07 DIAGNOSIS — C49A0 Gastrointestinal stromal tumor, unspecified site: Secondary | ICD-10-CM

## 2012-07-07 DIAGNOSIS — E538 Deficiency of other specified B group vitamins: Secondary | ICD-10-CM

## 2012-07-07 MED ORDER — CYANOCOBALAMIN 1000 MCG/ML IJ SOLN
1000.0000 ug | Freq: Once | INTRAMUSCULAR | Status: AC
  Administered 2012-07-07: 1000 ug via INTRAMUSCULAR

## 2012-08-03 ENCOUNTER — Other Ambulatory Visit: Payer: Self-pay | Admitting: Oncology

## 2012-08-04 ENCOUNTER — Encounter: Payer: Self-pay | Admitting: Oncology

## 2012-08-04 ENCOUNTER — Telehealth: Payer: Self-pay | Admitting: Oncology

## 2012-08-04 ENCOUNTER — Ambulatory Visit (HOSPITAL_BASED_OUTPATIENT_CLINIC_OR_DEPARTMENT_OTHER): Payer: Managed Care, Other (non HMO)

## 2012-08-04 ENCOUNTER — Ambulatory Visit (HOSPITAL_BASED_OUTPATIENT_CLINIC_OR_DEPARTMENT_OTHER): Payer: Managed Care, Other (non HMO) | Admitting: Oncology

## 2012-08-04 ENCOUNTER — Other Ambulatory Visit (HOSPITAL_BASED_OUTPATIENT_CLINIC_OR_DEPARTMENT_OTHER): Payer: Managed Care, Other (non HMO)

## 2012-08-04 VITALS — BP 128/82 | HR 82 | Temp 97.5°F | Resp 20 | Ht 72.5 in | Wt 132.2 lb

## 2012-08-04 DIAGNOSIS — C169 Malignant neoplasm of stomach, unspecified: Secondary | ICD-10-CM

## 2012-08-04 DIAGNOSIS — C649 Malignant neoplasm of unspecified kidney, except renal pelvis: Secondary | ICD-10-CM

## 2012-08-04 DIAGNOSIS — C49A0 Gastrointestinal stromal tumor, unspecified site: Secondary | ICD-10-CM

## 2012-08-04 DIAGNOSIS — E538 Deficiency of other specified B group vitamins: Secondary | ICD-10-CM

## 2012-08-04 DIAGNOSIS — Z23 Encounter for immunization: Secondary | ICD-10-CM

## 2012-08-04 LAB — COMPREHENSIVE METABOLIC PANEL
CO2: 24 mEq/L (ref 19–32)
Creatinine, Ser: 0.94 mg/dL (ref 0.50–1.35)
Glucose, Bld: 196 mg/dL — ABNORMAL HIGH (ref 70–99)
Total Bilirubin: 1.6 mg/dL — ABNORMAL HIGH (ref 0.3–1.2)

## 2012-08-04 LAB — CBC WITH DIFFERENTIAL/PLATELET
Basophils Absolute: 0.1 10*3/uL (ref 0.0–0.1)
EOS%: 2.1 % (ref 0.0–7.0)
HCT: 40.9 % (ref 38.4–49.9)
HGB: 13.7 g/dL (ref 13.0–17.1)
MCH: 31.4 pg (ref 27.2–33.4)
MCV: 93.5 fL (ref 79.3–98.0)
MONO%: 9.5 % (ref 0.0–14.0)
NEUT%: 58.8 % (ref 39.0–75.0)
Platelets: 228 10*3/uL (ref 140–400)

## 2012-08-04 MED ORDER — CYANOCOBALAMIN 1000 MCG/ML IJ SOLN
1000.0000 ug | Freq: Once | INTRAMUSCULAR | Status: AC
  Administered 2012-08-04: 1000 ug via INTRAMUSCULAR

## 2012-08-04 MED ORDER — INFLUENZA VIRUS VACC SPLIT PF IM SUSP
0.5000 mL | INTRAMUSCULAR | Status: AC
  Administered 2012-08-04: 0.5 mL via INTRAMUSCULAR
  Filled 2012-08-04: qty 0.5

## 2012-08-04 NOTE — Progress Notes (Signed)
OFFICE PROGRESS NOTE   08/04/2012   Physicians: Y.Lowne, E.Levine, P.Savage, T.Cornett, S.Dahlstedt, J.Perry   INTERVAL HISTORY:  Patient is seen, alone for visit, in continuing attention to his history of recurrent GIST, continuing Gleevec which he is tolerating well at reduced dose. He also has history of T1a renal cell carcinoma and requires monthly B12 injections due to prior GI surgeries.  History is of GIST initially disgnosed 2007, with subtotal gastrectomy with Roux-en-Y, then briefly on study with Gleevec which he did not tolerate due to marked elevations in LFTs. He had recurrent GIST in late 2012 with further surgery by Dr.Levine March 2011, then rapid elevation of LFTs with 50% dosing (200 mg/d) gleevec, with gleevec again discontinued after short period in 2011. He had further progression in June 2012, did tolerate gleevec at 100mg /d with initial prednisone, with adequate shrinkage of tumor to allow surgery by Dr. Lenis Noon Sep 27, 2011. At laparotomy he had extensive adhesions but no gross peritoneal, hepatic or bowel involvement. The mass itself was cystic and soft and not densely adherent to any visceral structures. Pathology([facility] [phone]) recurrent GIST 8.9 cm with resection margins not involved and comment that microscopically this exhibits an epithelial and glandular growth pattern which differs from previous specimens; nuclei medium-sized and bland; immunohistochemical panel postive for c-kit. No mitotic index is reported. Mr.Coutant saw Dr.Levine in  Dec 2012 and does not have any additiional appointments scheduled at [facility] now. Last CT abdomen was May 2013; last PET Jan 2011. Mr.Krusemark continues q 4 week B12 injections, necessary due to gastric/ bowel resection. He was iron deficient related to surgeries and blood draws, previously had marked skin reaction to feraheme, but did tolerate inferon on 12-10-2011 with no difficulty.  He also has history of T1a renal cell carcinoma,  treated with partial right nephrectomy in 2008 and not active since then.  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 He has had no fever or symptoms of infection, energy good, appetite good for frequent small amounts. He has no new GI complaints, no pain, no respiratory symptoms, no bleeding. Remainder of 10 point Review of Systems negative/ unchanged.  Present insurance on COBRA completes end of this year.  Objective:  Vital signs in last 24 hours:  BP 128/82  Pulse 82  Temp 97.5 F (36.4 C) (Oral)  Resp 20  Ht 6' 0.5" (1.842 m)  Wt 132 lb 3.2 oz (59.966 kg)  BMI 17.68 kg/m2 Weight is up 1 lb. Easily mobile, looks comfortable.    HEENT:PERRLA, sclera clear, anicteric, oropharynx clear, no lesions and neck supple with midline trachea LymphaticsCervical, supraclavicular, and axillary nodes normal. Resp: clear to auscultation bilaterally and normal percussion bilaterally Cardio: regular rate and rhythm GI: soft, nontender, well healed surgical incisions, no appreciable mass or organomegaly, nontender, normal bowel sounds Extremities: extremities normal, atraumatic, no cyanosis or edema Neuro: nonfocal Skin without rash or ecchymosis  Lab Results:  Results for orders placed in visit on 08/04/12  CBC WITH DIFFERENTIAL      Component Value Range   WBC 6.3  4.0 - 10.3 10e3/uL   NEUT# 3.7  1.5 - 6.5 10e3/uL   HGB 13.7  13.0 - 17.1 g/dL   HCT 54.0  98.1 - 19.1 %   Platelets 228  140 - 400 10e3/uL   MCV 93.5  79.3 - 98.0 fL   MCH 31.4  27.2 - 33.4 pg   MCHC 33.6  32.0 - 36.0 g/dL  RBC 4.38  4.20 - 5.82 10e6/uL   RDW 13.7  11.0 - 14.6 %   lymph# 1.8  0.9 - 3.3 10e3/uL   MONO# 0.6  0.1 - 0.9 10e3/uL   Eosinophils Absolute 0.1  0.0 - 0.5 10e3/uL   Basophils Absolute 0.1  0.0 - 0.1 10e3/uL   NEUT% 58.8  39.0 - 75.0 %   LYMPH% 28.5  14.0 - 49.0 %   MONO% 9.5  0.0 - 14.0 %   EOS% 2.1  0.0 - 7.0 %   BASO% 1.1  0.0 - 2.0 %    COMPREHENSIVE METABOLIC PANEL      Component Value Range   Sodium 142  135 - 145 mEq/L   Potassium 3.8  3.5 - 5.3 mEq/L   Chloride 106  96 - 112 mEq/L   CO2 24  19 - 32 mEq/L   Glucose, Bld 196 (*) 70 - 99 mg/dL   BUN 7  6 - 23 mg/dL   Creatinine, Ser 1.61  0.50 - 1.35 mg/dL   Total Bilirubin 1.6 (*) 0.3 - 1.2 mg/dL   Alkaline Phosphatase 32 (*) 39 - 117 U/L   AST 24  0 - 37 U/L   ALT 21  0 - 53 U/L   Total Protein 6.0  6.0 - 8.3 g/dL   Albumin 3.8  3.5 - 5.2 g/dL   Calcium 9.1  8.4 - 09.6 mg/dL     Studies/Results:  No results found.  Medications: I have reviewed the patient's current medications. He is given flu vaccine as well as B12 today.  Assessment/Plan: 1. Recurrent GIST: continuing Gleevec 100 mg daily. Will repeat CT in ~ next month and will let him know results probably by phone. I will see him at least in ~ 4 months, coordinating with B12 shot. 2.T1a renal cell carcinoma: follow up also on CT above and will check urine specimen at next visit here. Renal function is excellent with partial nephrectomy done. 3.B12 deficiency: needs ongoing injections 4.post splenectomy with GIST surgery: flu shot done   Patient is in agreement with plan   Allen Egerton P, MD   08/04/2012, 3:15 PM

## 2012-08-04 NOTE — Assessment & Plan Note (Signed)
right

## 2012-08-04 NOTE — Telephone Encounter (Signed)
Made dermatology appt with scheduler Toniann Fail with Dr. Terri Piedra [phone] gv and printed appt schedule for OCT-FEB for pt.

## 2012-08-04 NOTE — Patient Instructions (Signed)
We will talk with you by phone about results of the CT scans. Please call Dr Precious Reel office if we have not called you within a couple of days after scans are done.

## 2012-08-09 ENCOUNTER — Other Ambulatory Visit: Payer: Self-pay | Admitting: Oncology

## 2012-08-12 ENCOUNTER — Telehealth: Payer: Self-pay | Admitting: Oncology

## 2012-08-12 NOTE — Telephone Encounter (Signed)
S/w jennifer in [facility]en[facility] and she states/confirmed that the order has been changed to abd only.    This per pof from 10/22       aom

## 2012-08-12 NOTE — Telephone Encounter (Signed)
Spoke with [facility] CT to cancel CT pelvis/ keep CT abd 08-19-12.  Ila Mcgill, MD

## 2012-08-19 ENCOUNTER — Other Ambulatory Visit (HOSPITAL_BASED_OUTPATIENT_CLINIC_OR_DEPARTMENT_OTHER): Payer: Managed Care, Other (non HMO)

## 2012-08-19 ENCOUNTER — Other Ambulatory Visit: Payer: Self-pay | Admitting: Medical Oncology

## 2012-08-19 ENCOUNTER — Ambulatory Visit (HOSPITAL_COMMUNITY)
Admission: RE | Admit: 2012-08-19 | Discharge: 2012-08-19 | Disposition: A | Discharge status: Home / Self Care | Payer: Managed Care, Other (non HMO) | Source: Ambulatory Visit | Attending: Oncology | Admitting: Oncology

## 2012-08-19 DIAGNOSIS — Z9089 Acquired absence of other organs: Secondary | ICD-10-CM | POA: Insufficient documentation

## 2012-08-19 DIAGNOSIS — Z85528 Personal history of other malignant neoplasm of kidney: Secondary | ICD-10-CM | POA: Insufficient documentation

## 2012-08-19 DIAGNOSIS — C494 Malignant neoplasm of connective and soft tissue of abdomen: Secondary | ICD-10-CM | POA: Insufficient documentation

## 2012-08-19 DIAGNOSIS — C169 Malignant neoplasm of stomach, unspecified: Secondary | ICD-10-CM

## 2012-08-19 DIAGNOSIS — Q638 Other specified congenital malformations of kidney: Secondary | ICD-10-CM | POA: Insufficient documentation

## 2012-08-19 DIAGNOSIS — C49A0 Gastrointestinal stromal tumor, unspecified site: Secondary | ICD-10-CM

## 2012-08-19 LAB — COMPREHENSIVE METABOLIC PANEL (CC13)
ALT: 22 U/L (ref 0–55)
Albumin: 3.6 g/dL (ref 3.5–5.0)
CO2: 30 mEq/L — ABNORMAL HIGH (ref 22–29)
Calcium: 9.3 mg/dL (ref 8.4–10.4)
Chloride: 105 mEq/L (ref 98–107)
Creatinine: 1 mg/dL (ref 0.7–1.3)
Potassium: 4.2 mEq/L (ref 3.5–5.1)
Total Protein: 6.5 g/dL (ref 6.4–8.3)

## 2012-08-19 MED ORDER — IOHEXOL 300 MG/ML  SOLN
100.0000 mL | Freq: Once | INTRAMUSCULAR | Status: AC | PRN
  Administered 2012-08-19: 100 mL via INTRAVENOUS

## 2012-08-19 NOTE — Progress Notes (Signed)
Erroneous encounter

## 2012-08-21 ENCOUNTER — Telehealth: Payer: Self-pay

## 2012-08-21 NOTE — Telephone Encounter (Signed)
Henry [name], Henry [name] 08/19/12 More Detail >>      Reece Packer, MD      Sent: Wed August 20, 2012  9:30 PM    To: Lorine Bears, RN          Result Note     CT report from 08-19-12 noted. Please let patient know that scan looks good, without anything that suggests progressive GIST and nothing different with kidneys.     Attached to     CT ABDOMEN W CONTRAST (Order# [id number])       CT Abdomen W Contrast   Status: Final result       PACS Images     Show images for CT Abdomen W Contrast      Study Result     *RADIOLOGY REPORT*   Clinical Data: Status post GIST resection   CT ABDOMEN AND PELVIS WITH CONTRAST   Technique:  Multidetector CT imaging of the abdomen and pelvis was performed following the standard protocol during bolus administration of intravenous contrast.   Contrast: OMNIPAQUE IOHEXOL 300 MG/ML  SOLN   Comparison: 03/10/2012   Findings: The lung bases are clear.  No pericardial or pleural effusion.   Along the dome of the liver there is a stable 8.2 mm hypodense structure, image 8.  Several smaller stable low density structures are noted in the right hepatic lobe.  Previous cholecystectomy. The common bile duct is increased in caliber measuring 9.7 mm. This is unchanged from previous exam.  The pancreas is unremarkable.  Previous splenectomy.   The adrenal glands are both normal.  Right kidney is malrotated, as before.  The left kidney is normal.  The urinary bladder appears within normal limits.  Prostate gland and seminal vesicles are normal.   There are no enlarged upper abdominal or pelvic lymph nodes.  No free fluid or fluid collections identified within the abdomen or pelvis.   Postoperative change from partial gastrectomy identified.  The small bowel loops have a normal caliber.  Normal appearance of the colon.   Review of the visualized bony structures is unremarkable.   IMPRESSION:   1.  Stable CT of the  abdomen pelvis.  No specific features identified to suggest residual or recurrence of tumor.     Original Report Authenticated By: Rosealee Albee, M.D.        Result Notes     Notes Recorded by Reece Packer, MD on 08/20/2012 at 9:30 PM CT report from 08-19-12 noted. Please let patient know that scan looks good, without anything that suggests progressive GIST and nothing different with kidneys.      External Result Report          External Result Report       Imaging     Imaging Information       Signed by       Signed Date/Time   Phone Pager    Rosealee Albee 08/19/2012  9:15 AM EDT [phone]      Exam Information       Status Exam Begun   Exam Ended      Final [99] 08/19/2012  8:42 AM EDT 08/19/2012  8:46 AM EDT      Result Notes     Notes Recorded by Reece Packer, MD on 08/20/2012 at 9:30 PM CT report from 08-19-12 noted. Please let patient know that scan looks good, without anything that suggests progressive GIST and nothing different with kidneys.  Imaging Related Medications            Medication    iohexol (OMNIPAQUE) 300 MG/ML solution 100 mL    Route:   Intravenous    Admin Dose:   100 mL    Volume:   100 mL    PRN Reason(s):   contrast    Last Admin Time:   08/19/12 0843    Number of Doses:   1       Most Recent Administration:       User Action Time Recorded Time Dose Route Site Comment Action Reason    Samara Deist 08/19/12 2956 08/19/12 0843 100 mL Intravenous     Contrast Given      Full Administration Report                             Original Order       Ordered On Ordered By      Mon Aug 04, 2012 9:56 AM Elaina Hoops

## 2012-08-21 NOTE — Telephone Encounter (Signed)
Message copied by Lorine Bears on Thu Aug 21, 2012  1:42 PM ------      Message from: Jama Flavors P      Created: Wed Aug 20, 2012  9:36 PM       Labs seen and need follow up: also please let him know labs stable

## 2012-08-21 NOTE — Telephone Encounter (Signed)
Left a message in vm regarding results of lab and Ct scan from 08-19-12 as noted below by Dr. Darrold Span.

## 2012-09-01 ENCOUNTER — Ambulatory Visit (HOSPITAL_BASED_OUTPATIENT_CLINIC_OR_DEPARTMENT_OTHER): Payer: Managed Care, Other (non HMO)

## 2012-09-01 VITALS — BP 104/67 | HR 69 | Temp 98.0°F

## 2012-09-01 DIAGNOSIS — E538 Deficiency of other specified B group vitamins: Secondary | ICD-10-CM

## 2012-09-01 DIAGNOSIS — C49A0 Gastrointestinal stromal tumor, unspecified site: Secondary | ICD-10-CM

## 2012-09-01 MED ORDER — CYANOCOBALAMIN 1000 MCG/ML IJ SOLN
1000.0000 ug | Freq: Once | INTRAMUSCULAR | Status: AC
  Administered 2012-09-01: 1000 ug via INTRAMUSCULAR

## 2012-09-29 ENCOUNTER — Ambulatory Visit (HOSPITAL_BASED_OUTPATIENT_CLINIC_OR_DEPARTMENT_OTHER): Payer: Managed Care, Other (non HMO)

## 2012-09-29 VITALS — BP 125/81 | HR 70 | Temp 97.4°F

## 2012-09-29 DIAGNOSIS — C49A0 Gastrointestinal stromal tumor, unspecified site: Secondary | ICD-10-CM

## 2012-09-29 DIAGNOSIS — E538 Deficiency of other specified B group vitamins: Secondary | ICD-10-CM

## 2012-09-29 MED ORDER — CYANOCOBALAMIN 1000 MCG/ML IJ SOLN: 1000.0000 ug | Freq: Once | INTRAMUSCULAR | Status: DC

## 2012-10-02 ENCOUNTER — Other Ambulatory Visit: Payer: Self-pay | Admitting: *Deleted

## 2012-10-02 NOTE — Telephone Encounter (Signed)
Henry [name] in the care management department of [facility] called to inform Henry [name] that Henry [name] had concerns about his Gleevac. He has not had it filled since September. Evidently he read that it can cause damage to the liver and has stopped taking it.

## 2012-10-03 ENCOUNTER — Other Ambulatory Visit: Payer: Self-pay | Admitting: *Deleted

## 2012-10-03 NOTE — Telephone Encounter (Signed)
Called pt to discuss Gleevec. He states that he does not have any concerns about medication, has been taking as ordered. Pt was frustrated by person at [facility], stating they would not answer his question. BUT he has no plans to stop taking med as ordered!

## 2012-10-27 ENCOUNTER — Ambulatory Visit (HOSPITAL_BASED_OUTPATIENT_CLINIC_OR_DEPARTMENT_OTHER): Payer: Self-pay

## 2012-10-27 VITALS — BP 129/74 | HR 86 | Temp 97.9°F | Resp 20

## 2012-10-27 DIAGNOSIS — C49A0 Gastrointestinal stromal tumor, unspecified site: Secondary | ICD-10-CM

## 2012-10-27 DIAGNOSIS — E538 Deficiency of other specified B group vitamins: Secondary | ICD-10-CM

## 2012-10-27 MED ORDER — CYANOCOBALAMIN 1000 MCG/ML IJ SOLN
1000.0000 ug | Freq: Once | INTRAMUSCULAR | Status: AC
  Administered 2012-10-27: 1000 ug via INTRAMUSCULAR

## 2012-10-29 ENCOUNTER — Telehealth: Payer: Self-pay | Admitting: *Deleted

## 2012-10-29 NOTE — Telephone Encounter (Signed)
Pt called to say he has a tooth abscess and would like Dr Darrold Span to call in antibiotic. Encouraged pt to follow up with dentist to make sure of abscess. Pt was able to do so.

## 2012-11-06 NOTE — Progress Notes (Signed)
Vitamin B12 injection given as ordered per Dr Darrold Span. Orders in computer

## 2012-11-30 ENCOUNTER — Other Ambulatory Visit: Payer: Self-pay | Admitting: Oncology

## 2012-12-01 ENCOUNTER — Encounter: Payer: Self-pay | Admitting: Oncology

## 2012-12-01 ENCOUNTER — Ambulatory Visit (HOSPITAL_BASED_OUTPATIENT_CLINIC_OR_DEPARTMENT_OTHER): Payer: BC Managed Care – PPO

## 2012-12-01 ENCOUNTER — Other Ambulatory Visit (HOSPITAL_BASED_OUTPATIENT_CLINIC_OR_DEPARTMENT_OTHER): Payer: BC Managed Care – PPO

## 2012-12-01 ENCOUNTER — Telehealth: Payer: Self-pay

## 2012-12-01 ENCOUNTER — Telehealth: Payer: Self-pay | Admitting: Oncology

## 2012-12-01 ENCOUNTER — Ambulatory Visit (HOSPITAL_BASED_OUTPATIENT_CLINIC_OR_DEPARTMENT_OTHER): Payer: BC Managed Care – PPO | Admitting: Oncology

## 2012-12-01 VITALS — BP 123/78 | HR 64 | Temp 98.1°F | Resp 20 | Ht 72.5 in | Wt 129.4 lb

## 2012-12-01 DIAGNOSIS — E538 Deficiency of other specified B group vitamins: Secondary | ICD-10-CM

## 2012-12-01 DIAGNOSIS — C49A0 Gastrointestinal stromal tumor, unspecified site: Secondary | ICD-10-CM

## 2012-12-01 DIAGNOSIS — Z85528 Personal history of other malignant neoplasm of kidney: Secondary | ICD-10-CM

## 2012-12-01 DIAGNOSIS — Z9089 Acquired absence of other organs: Secondary | ICD-10-CM

## 2012-12-01 DIAGNOSIS — C494 Malignant neoplasm of connective and soft tissue of abdomen: Secondary | ICD-10-CM

## 2012-12-01 LAB — CBC WITH DIFFERENTIAL/PLATELET
BASO%: 0.6 % (ref 0.0–2.0)
HCT: 40.3 % (ref 38.4–49.9)
MCHC: 33.9 g/dL (ref 32.0–36.0)
MONO#: 0.6 10*3/uL (ref 0.1–0.9)
NEUT%: 54.1 % (ref 39.0–75.0)
RBC: 4.38 10*6/uL (ref 4.20–5.82)
WBC: 8.2 10*3/uL (ref 4.0–10.3)
lymph#: 3.1 10*3/uL (ref 0.9–3.3)

## 2012-12-01 LAB — COMPREHENSIVE METABOLIC PANEL (CC13)
ALT: 26 U/L (ref 0–55)
Albumin: 3.3 g/dL — ABNORMAL LOW (ref 3.5–5.0)
CO2: 30 mEq/L — ABNORMAL HIGH (ref 22–29)
Calcium: 8.8 mg/dL (ref 8.4–10.4)
Chloride: 107 mEq/L (ref 98–107)
Sodium: 144 mEq/L (ref 136–145)
Total Protein: 6.5 g/dL (ref 6.4–8.3)

## 2012-12-01 LAB — URINALYSIS, MICROSCOPIC - CHCC
Glucose: NEGATIVE mg/dL
Nitrite: NEGATIVE
Urobilinogen, UR: 0.2 mg/dL (ref 0.2–1)

## 2012-12-01 MED ORDER — CYANOCOBALAMIN 1000 MCG/ML IJ SOLN
1000.0000 ug | Freq: Once | INTRAMUSCULAR | Status: AC
  Administered 2012-12-01: 1000 ug via INTRAMUSCULAR

## 2012-12-01 NOTE — Telephone Encounter (Signed)
Told [name] his lab results as noted below by Dr. Darrold Span.

## 2012-12-01 NOTE — Telephone Encounter (Signed)
Gave pt appt for injection every months until June 2014 lab,MD and injections gave pt oral contrast for CT on April 2014

## 2012-12-01 NOTE — Telephone Encounter (Signed)
Message copied by Lorine Bears on Mon Dec 01, 2012  5:49 PM ------      Message from: Jama Flavors P      Created: Mon Dec 01, 2012  1:12 PM       Labs seen and need follow up: please let him know liver chemistries not elevated, albumin just a little low so he could increase protein in diet,  urine had no blood. So overall good with all labs. ------

## 2012-12-01 NOTE — Progress Notes (Signed)
OFFICE PROGRESS NOTE   12/01/2012   Physicians:Y.Lowne, E.Levine, P.Savage, T.Cornett, S.Dahlstedt, J.Perry   INTERVAL HISTORY:  Patient is seen, alone for visit, in continuing attention to his history of recurrent GIST, continuing Gleevec which he is tolerating well at reduced dose. He also has history of T1a renal cell carcinoma and requires monthly B12 injections due to prior GI surgeries.  History is of GIST initially disgnosed 2007, with subtotal gastrectomy with Roux-en-Y, then briefly on study with Gleevec which he did not tolerate due to marked elevations in LFTs. He had recurrent GIST in late 2012 with further surgery by Dr.Levine March 2011, then rapid elevation of LFTs with 50% dosing (200 mg/d) gleevec, with gleevec again discontinued after short period in 2011. He had further progression in June 2012, did tolerate gleevec at 100mg /d with initial prednisone, with adequate shrinkage of tumor to allow surgery by Dr. Lenis Noon Sep 27, 2011. At laparotomy he had extensive adhesions but no gross peritoneal, hepatic or bowel involvement. The mass itself was cystic and soft and not densely adherent to any visceral structures. Pathology([facility] [phone]) recurrent GIST 8.9 cm with resection margins not involved and comment that microscopically this exhibits an epithelial and glandular growth pattern which differs from previous specimens; nuclei medium-sized and bland; immunohistochemical panel postive for c-kit. No mitotic index is reported. Mr.Minar saw Dr.Levine in Dec 2012 and does not have any additiional appointments scheduled at [facility] now. Last CT abdomen was 08-20-2012; last PET Jan 2011.  Mr.Pelc continues q 4 week B12 injections, necessary due to gastric/ bowel resection. He was iron deficient related to surgeries and blood draws, previously had marked skin reaction to feraheme, but did tolerate inferon on 12-10-2011 with no difficulty.  He also has history of T1a renal cell carcinoma,  treated with partial right nephrectomy in 2008 and not active since then.  He continues to feel well, with no abdominal discomfort or other pain. He is driving to [place] every other week, has not been eating or drinking fluids quite as much recently with this schedule. He has had no fever or symptoms of infection, no noted bleeding. He has two fillings that need dental attention, was treated with PCN by dentist recently. Remainder of 10 point Review of Systems negative.  Objective:  Vital signs in last 24 hours: Weight 129 lbs 6 oz, BP 123/78, HR 64 regular, resp 20 not labored RA, temp 98.1.  Easily mobile, looks comfortable    HEENT:PERRLA and oropharynx clear, no lesions Mucosa moist. Not icteric. No swelling or erythema at gums Lymphatics: no cervical or supraclavicular adenopathy Resp: clear to auscultation bilaterally and normal percussion bilaterally Cardio: regular rate and rhythm GI: soft, non-tender; bowel sounds normal; no masses,  no organomegaly Surgical incisions not remarkable. Extremities: extremities normal, atraumatic, no cyanosis or edema Neuro:nonfocal Skin without rash or ecchymosis  Lab Results: Today:  WBC 8.2, ANC 4.5, Hgb 13.7, plt 248, MCV 91.9 CMET normal with exception of CO2 30, glucose 108, AP 37 and alb 3.3 Urinalysis trace blood, 0-2 RBCs, negative protein, negative LE, WBC 0-2  Studies/Results: CT ABDOMEN AND PELVIS WITH CONTRAST 08-20-12 Technique: Multidetector CT imaging of the abdomen and pelvis was  performed following the standard protocol during bolus  administration of intravenous contrast.  Contrast: OMNIPAQUE IOHEXOL 300 MG/ML SOLN  Comparison: 03/10/2012  Findings: The lung bases are clear. No pericardial or pleural  effusion.  Along the dome of the liver there is a stable 8.2 mm hypodense  structure, image 8.  Several smaller stable low density structures  are noted in the right hepatic lobe. Previous cholecystectomy.  The  common bile duct is increased in caliber measuring 9.7 mm.  This is unchanged from previous exam. The pancreas is  unremarkable. Previous splenectomy.  The adrenal glands are both normal. Right kidney is malrotated, as  before. The left kidney is normal. The urinary bladder appears  within normal limits. Prostate gland and seminal vesicles are  normal.  There are no enlarged upper abdominal or pelvic lymph nodes. No  free fluid or fluid collections identified within the abdomen or  pelvis.  Postoperative change from partial gastrectomy identified. The  small bowel loops have a normal caliber. Normal appearance of the  colon.  Review of the visualized bony structures is unremarkable.  IMPRESSION:  1. Stable CT of the abdomen pelvis. No specific features  identified to suggest residual or recurrence of tumor.   Medications: I have reviewed the patient's current medications. He has received B12 injection today. Following visit today, [facility] pharmacy sent notification of national B12 injection shortage; we will ask if PCP Dr Laury Axon has B12 injection available thru her office, as I do not think even high dose oral will be adequate substitute for this situation. Patient has been using some Gleevec from previous prescription, will reorder in near future.  Assessment/Plan:  1. Recurrent GIST: continuing Gleevec 100 mg daily. Will repeat CT six months from the Oct 2013 scan (=April 2014). I will see him at least in ~ 4 months, coordinating with B12 shot. He needs to be seen every 3-6 months and have imaging every 6 months at least until out 5 years from most recent surgery 2.T1a renal cell carcinoma: follow up also on CT above and will check urine specimen at next visit here. Renal function is excellent with partial nephrectomy done.  3.B12 deficiency: needs ongoing injections if these can be acquired with present limited supplies 4.post splenectomy with GIST surgery: flu shot done  Information  about B12 availability not known until after visit today.   LIVESAY,LENNIS P, MD   12/01/2012, 9:19 AM

## 2012-12-01 NOTE — Patient Instructions (Signed)
Reorder gleevec. Call Dr Precious Reel office if any problems with getting this.  We will set up 6 month follow up CT ~ late April.

## 2012-12-08 ENCOUNTER — Telehealth: Payer: Self-pay

## 2012-12-08 NOTE — Telephone Encounter (Signed)
Spoke with Henry [name] with Dr. Laury Axon and she is able to accommodate Henry [name] for B12 injection ~ 3-11;4-14; 03-02-13.   Henry [name] aware of the situation and will call Henry [name] office to schedule appts. Henry [name] to see patient in June and will receive injection at [facility] if B12 available.  Cancelled injections at cancer center for dates noted above.

## 2012-12-08 NOTE — Telephone Encounter (Signed)
Spoke with patient and I got him scheduled for 12/30/12, 02/03/13 and 03/03/13 al at 9:30. His B-12 has been place in [place] office in the cabinet so they will not be used by anyone else. Dois Davenport and Harriett Sine have both been made aware.      KP

## 2012-12-08 NOTE — Telephone Encounter (Signed)
         Henry Packer, Henry [name] ','<More Detail >>       Henry Packer, Henry [name]  Henry [name]     MRN: [id number] DOB: 1951-05-07     Pt Home: [phone]     Sent: Sheral Flow December 01, 2012 7:42 PM     To: Lorine Bears, RN     Cc: Carloyn Jaeger, [facility]                          Message    National shortage of injection B12, so [facility] pharmacy wants to keep supply for necessary use with certain chemo drugs. Please call Mr Lomanto's PCP Dr Loreen Freud -- See if her office might have B12 shots to give him March 11, April 14, May 12. I do not think he will absorb even high dose oral B12 due to gastric and small bowel resections for the GIST.   Will need to let Mr Kunka know plan IF different from our office, as I was not aware of this at his AM appointment today.   thanks

## 2012-12-08 NOTE — Telephone Encounter (Signed)
I got a call from Louise at the [facility] requesting that the patient have B-12 on March 11 th, April 14 th and May 12 th. The patient has not been seen since 2009 and has a hx of Stomach Cancer, he is being follow by Oncology but due to a shortage on the B-12 he will not be able to receive the injectios there. Dr.Livesay would like to know if the patient could come to our office on the above dates for his B-12 injections since oral B-12 would not be an option for him. Please review chart and advise    KP

## 2012-12-08 NOTE — Telephone Encounter (Signed)
Yes that is fine

## 2012-12-08 NOTE — Telephone Encounter (Signed)
Detailed msg left for Henry [name] advising this is ok and appointments will need to be scheduled. I will place 3 b-12 vials on hold for the patient and label them with his name.      KP//CMA

## 2012-12-30 ENCOUNTER — Ambulatory Visit: Payer: Self-pay

## 2013-02-02 ENCOUNTER — Ambulatory Visit: Payer: Self-pay

## 2013-02-03 ENCOUNTER — Ambulatory Visit: Payer: Self-pay

## 2013-02-16 ENCOUNTER — Ambulatory Visit (HOSPITAL_COMMUNITY)
Admission: RE | Admit: 2013-02-16 | Discharge: 2013-02-16 | Disposition: A | Discharge status: Home / Self Care | Payer: BC Managed Care – PPO | Source: Ambulatory Visit | Attending: Oncology | Admitting: Oncology

## 2013-02-16 ENCOUNTER — Other Ambulatory Visit (HOSPITAL_BASED_OUTPATIENT_CLINIC_OR_DEPARTMENT_OTHER): Payer: BC Managed Care – PPO

## 2013-02-16 ENCOUNTER — Encounter (HOSPITAL_COMMUNITY): Payer: Self-pay

## 2013-02-16 DIAGNOSIS — C494 Malignant neoplasm of connective and soft tissue of abdomen: Secondary | ICD-10-CM | POA: Insufficient documentation

## 2013-02-16 DIAGNOSIS — K7689 Other specified diseases of liver: Secondary | ICD-10-CM | POA: Insufficient documentation

## 2013-02-16 DIAGNOSIS — Z903 Acquired absence of stomach [part of]: Secondary | ICD-10-CM | POA: Insufficient documentation

## 2013-02-16 DIAGNOSIS — Z934 Other artificial openings of gastrointestinal tract status: Secondary | ICD-10-CM | POA: Insufficient documentation

## 2013-02-16 DIAGNOSIS — C49A0 Gastrointestinal stromal tumor, unspecified site: Secondary | ICD-10-CM

## 2013-02-16 DIAGNOSIS — Z9089 Acquired absence of other organs: Secondary | ICD-10-CM | POA: Insufficient documentation

## 2013-02-16 LAB — COMPREHENSIVE METABOLIC PANEL (CC13)
Albumin: 3.3 g/dL — ABNORMAL LOW (ref 3.5–5.0)
Alkaline Phosphatase: 36 U/L — ABNORMAL LOW (ref 40–150)
BUN: 13.1 mg/dL (ref 7.0–26.0)
Calcium: 9.1 mg/dL (ref 8.4–10.4)
Chloride: 104 mEq/L (ref 98–107)
Creatinine: 0.8 mg/dL (ref 0.7–1.3)
Glucose: 95 mg/dl (ref 70–99)
Sodium: 141 mEq/L (ref 136–145)

## 2013-02-16 LAB — CBC WITH DIFFERENTIAL/PLATELET
BASO%: 1 % (ref 0.0–2.0)
LYMPH%: 31.9 % (ref 14.0–49.0)
MCHC: 34.7 g/dL (ref 32.0–36.0)
MONO#: 0.7 10*3/uL (ref 0.1–0.9)
RBC: 4.26 10*6/uL (ref 4.20–5.82)
WBC: 7.5 10*3/uL (ref 4.0–10.3)
lymph#: 2.4 10*3/uL (ref 0.9–3.3)

## 2013-02-16 MED ORDER — IOHEXOL 300 MG/ML  SOLN
100.0000 mL | Freq: Once | INTRAMUSCULAR | Status: AC | PRN
  Administered 2013-02-16: 100 mL via INTRAVENOUS

## 2013-02-16 MED ORDER — IOHEXOL 300 MG/ML  SOLN
50.0000 mL | Freq: Once | INTRAMUSCULAR | Status: AC | PRN
  Administered 2013-02-16: 50 mL via ORAL

## 2013-02-18 ENCOUNTER — Telehealth: Payer: Self-pay

## 2013-02-18 ENCOUNTER — Telehealth: Payer: Self-pay | Admitting: *Deleted

## 2013-02-18 NOTE — Telephone Encounter (Signed)
FYI------I called the patient because he did not receive his last two B-12 injection here at the office. He stated that he had been traveling so much he could not get into the ofice, he is never in [place] long enough to get it done, so he has been taking a B-complex OTC. [place] is  [place] now and will be traveling to out of the country from May 8th until June 6 th. He is willing to give up the two of the  B-12's that we have saved for him if we can hold one for him when he returns.       KP

## 2013-02-18 NOTE — Telephone Encounter (Signed)
Pt left a voice mail that he is out of town and has run out of [employer]. Has switched from [place] to [place]. Needs [place] to call a ref[facility] to [facility] [address] [place], [place]. Pt states he will need a few extra tablets on this refill,  as he will be out of the country from 5[place]8-6/6.

## 2013-02-19 ENCOUNTER — Telehealth: Payer: Self-pay | Admitting: *Deleted

## 2013-02-19 NOTE — Telephone Encounter (Signed)
ICD- 9 CODE 171.5 GIVEN TO PHARMACIST.

## 2013-02-20 ENCOUNTER — Telehealth: Payer: Self-pay | Admitting: *Deleted

## 2013-02-20 NOTE — Telephone Encounter (Signed)
Spoke with pharmacist. States they have the refill order for Gleevec, need dx code. Provided.

## 2013-02-23 ENCOUNTER — Encounter: Payer: Self-pay | Admitting: *Deleted

## 2013-02-23 NOTE — Progress Notes (Signed)
RECEIVED A FAX FROM [facility] CONCERNING A CONFIRMATION OF PRESCRIPTION SHIPMENT FOR GLEEVEC ON 02/20/13.

## 2013-02-24 ENCOUNTER — Telehealth: Payer: Self-pay

## 2013-02-24 NOTE — Telephone Encounter (Signed)
Told Mr. Timme that his labs and Ct  Scan were fine per Dr. Darrold Span as noted below.

## 2013-02-24 NOTE — Telephone Encounter (Signed)
Message copied by Lorine Bears on Tue Feb 24, 2013  5:44 PM ------      Message from: Reece Packer      Created: Mon Feb 16, 2013  8:55 PM       Labs seen and need follow up: see note about his labs also. Please let him know CT does not show anything of concern ------

## 2013-03-02 ENCOUNTER — Ambulatory Visit: Payer: Self-pay

## 2013-03-03 ENCOUNTER — Ambulatory Visit: Payer: Self-pay

## 2013-03-23 ENCOUNTER — Other Ambulatory Visit: Payer: Self-pay | Admitting: Oncology

## 2013-03-23 DIAGNOSIS — C49A0 Gastrointestinal stromal tumor, unspecified site: Secondary | ICD-10-CM

## 2013-03-30 ENCOUNTER — Ambulatory Visit: Payer: Self-pay

## 2013-04-03 ENCOUNTER — Other Ambulatory Visit: Payer: Self-pay | Admitting: Oncology

## 2013-04-03 DIAGNOSIS — C49A0 Gastrointestinal stromal tumor, unspecified site: Secondary | ICD-10-CM

## 2013-04-03 DIAGNOSIS — C649 Malignant neoplasm of unspecified kidney, except renal pelvis: Secondary | ICD-10-CM

## 2013-04-13 ENCOUNTER — Telehealth: Payer: Self-pay | Admitting: Oncology

## 2013-04-13 ENCOUNTER — Other Ambulatory Visit (HOSPITAL_BASED_OUTPATIENT_CLINIC_OR_DEPARTMENT_OTHER): Payer: BC Managed Care – PPO

## 2013-04-13 ENCOUNTER — Ambulatory Visit (HOSPITAL_BASED_OUTPATIENT_CLINIC_OR_DEPARTMENT_OTHER): Payer: BC Managed Care – PPO

## 2013-04-13 ENCOUNTER — Encounter: Payer: Self-pay | Admitting: Oncology

## 2013-04-13 ENCOUNTER — Ambulatory Visit (HOSPITAL_BASED_OUTPATIENT_CLINIC_OR_DEPARTMENT_OTHER): Payer: BC Managed Care – PPO | Admitting: Oncology

## 2013-04-13 VITALS — BP 122/78 | HR 78 | Temp 98.3°F | Resp 18 | Ht 72.5 in | Wt 131.8 lb

## 2013-04-13 DIAGNOSIS — C649 Malignant neoplasm of unspecified kidney, except renal pelvis: Secondary | ICD-10-CM

## 2013-04-13 DIAGNOSIS — E538 Deficiency of other specified B group vitamins: Secondary | ICD-10-CM

## 2013-04-13 DIAGNOSIS — C494 Malignant neoplasm of connective and soft tissue of abdomen: Secondary | ICD-10-CM

## 2013-04-13 DIAGNOSIS — C49A0 Gastrointestinal stromal tumor, unspecified site: Secondary | ICD-10-CM

## 2013-04-13 LAB — URINALYSIS, MICROSCOPIC - CHCC
Bilirubin (Urine): NEGATIVE
Ketones: NEGATIVE mg/dL
Leukocyte Esterase: NEGATIVE
Protein: NEGATIVE mg/dL
Specific Gravity, Urine: 1.015 (ref 1.003–1.035)
pH: 6 (ref 4.6–8.0)

## 2013-04-13 LAB — COMPREHENSIVE METABOLIC PANEL (CC13)
ALT: 20 U/L (ref 0–55)
Albumin: 3.3 g/dL — ABNORMAL LOW (ref 3.5–5.0)
CO2: 25 mEq/L (ref 22–29)
Chloride: 105 mEq/L (ref 98–107)
Glucose: 194 mg/dl — ABNORMAL HIGH (ref 70–99)
Potassium: 3.6 mEq/L (ref 3.5–5.1)
Sodium: 140 mEq/L (ref 136–145)
Total Bilirubin: 1.34 mg/dL — ABNORMAL HIGH (ref 0.20–1.20)
Total Protein: 6.7 g/dL (ref 6.4–8.3)

## 2013-04-13 LAB — CBC WITH DIFFERENTIAL/PLATELET
Basophils Absolute: 0.1 10*3/uL (ref 0.0–0.1)
Eosinophils Absolute: 0.1 10*3/uL (ref 0.0–0.5)
HCT: 39.7 % (ref 38.4–49.9)
HGB: 13.4 g/dL (ref 13.0–17.1)
NEUT#: 3.6 10*3/uL (ref 1.5–6.5)
NEUT%: 60 % (ref 39.0–75.0)
RDW: 13.7 % (ref 11.0–14.6)
lymph#: 1.5 10*3/uL (ref 0.9–3.3)

## 2013-04-13 MED ORDER — CYANOCOBALAMIN 1000 MCG/ML IJ SOLN
1000.0000 ug | Freq: Once | INTRAMUSCULAR | Status: AC
  Administered 2013-04-13: 1000 ug via INTRAMUSCULAR

## 2013-04-13 NOTE — Progress Notes (Signed)
OFFICE PROGRESS NOTE   04/13/2013   Physicians:Y.Lowne, E.Levine, P.Savage, T.Cornett, S.Dahlstedt, J.Perry   INTERVAL HISTORY:  Patient is seen, alone for visit, in continuing attention to to his history of recurrent GIST, continuing Gleevec which he is tolerating well at reduced dose. He also has history of T1a renal cell carcinoma and requires monthly B12 injections due to prior GI surgeries.   ONCOLOGIC HISTORY History is of GIST initially disgnosed 2007, with subtotal gastrectomy with Roux-en-Y, then briefly on study with Gleevec which he did not tolerate due to marked elevations in LFTs. He had recurrent GIST in late 2012 with further surgery by Dr.Levine March 2011, then rapid elevation of LFTs with 50% dosing (200 mg/d) gleevec, with gleevec again discontinued after short period in 2011. He had further progression in June 2012, did tolerate gleevec at 100mg /d with initial prednisone, with adequate shrinkage of tumor to allow surgery by Dr. Lenis Noon Sep 27, 2011. At laparotomy he had extensive adhesions but no gross peritoneal, hepatic or bowel involvement. The mass itself was cystic and soft and not densely adherent to any visceral structures. Pathology([facility] [phone]) recurrent GIST 8.9 cm with resection margins not involved and comment that microscopically this exhibits an epithelial and glandular growth pattern which differs from previous specimens; nuclei medium-sized and bland; immunohistochemical panel postive for c-kit. No mitotic index is reported. He saw Dr.Levine last in Dec 2012. Last CT abdomen was in [facility] system 02-16-2013; last PET Jan 2011.  Mr.Witherington has needed B12 injections due to gastric/ bowel resection, but is overdue these after some supply shortage; we will resume today. He was iron deficient related to surgeries and blood draws, previously had marked skin reaction to feraheme, but did tolerate inferon on 12-10-2011 with no difficulty.  He also has history of  T1a renal cell carcinoma, treated with partial right nephrectomy in 2008 and not active since then.   Generally feeling well, traveled to [place] with his work in April. Decreased distance vision OD and he will make appointment with ophthalmology. Eats very regularly, no abdominal pain, no N/V. No recent infectious illness. No respiratory complaints. Remainder of 10 point Review of Systems negative.  Objective:  Vital signs in last 24 hours:  BP 122/78  Pulse 78  Temp(Src) 98.3 F (36.8 C) (Oral)  Resp 18  Ht 6' 0.5" (1.842 m)  Wt 131 lb 12.8 oz (59.784 kg)  BMI 17.62 kg/m2  SpO2 99%  Weight is up 2.5 lbs. Easily ambulatory, looks comfortable  HEENT:PERRLA, sclera clear, anicteric and oropharynx clear, no lesions LymphaticsCervical, supraclavicular, and axillary nodes normal. Resp: clear to auscultation bilaterally and normal percussion bilaterally Cardio: regular rate and rhythm GI: multiple surgical scars healed. Soft, not distended, normal bowel sounds, not tender. No appreciable HSM or mass Extremities: extremities normal, atraumatic, no cyanosis or edema Neuro:no sensory deficits noted Skin without rash or ecchymosis  Lab Results:  Results for orders placed in visit on 04/13/13  CBC WITH DIFFERENTIAL      Result Value Range   WBC 6.0  4.0 - 10.3 10e3/uL   NEUT# 3.6  1.5 - 6.5 10e3/uL   HGB 13.4  13.0 - 17.1 g/dL   HCT 54.0  98.1 - 19.1 %   Platelets 258  140 - 400 10e3/uL   MCV 91.2  79.3 - 98.0 fL   MCH 30.7  27.2 - 33.4 pg   MCHC 33.7  32.0 - 36.0 g/dL   RBC 4.78  2.95 - 6.21 10e6/uL   RDW  13.7  11.0 - 14.6 %   lymph# 1.5  0.9 - 3.3 10e3/uL   MONO# 0.7  0.1 - 0.9 10e3/uL   Eosinophils Absolute 0.1  0.0 - 0.5 10e3/uL   Basophils Absolute 0.1  0.0 - 0.1 10e3/uL   NEUT% 60.0  39.0 - 75.0 %   LYMPH% 24.9  14.0 - 49.0 %   MONO% 11.4  0.0 - 14.0 %   EOS% 2.2  0.0 - 7.0 %   BASO% 1.5  0.0 - 2.0 %  URINALYSIS, MICROSCOPIC - [facility]      Result Value Range   Glucose  1000  Negative mg/dL   Bilirubin (Urine) Negative  Negative   Ketones Negative  Negative mg/dL   Specific Gravity, Urine 1.015  1.003 - 1.035   Blood Trace  Negative   pH 6.0  4.6 - 8.0   Protein Negative  Negative- <30 mg/dL   Urobilinogen, UR 0.2  0.2 - 1 mg/dL   Nitrite Negative  Negative   Leukocyte Esterase Negative  Negative   RBC / HPF 0-2  0 - 2   WBC, UA Negative  0 - 2   Mucus, UA Small  Negative- Small  COMPREHENSIVE METABOLIC PANEL ([facility]13)      Result Value Range   Sodium 140  136 - 145 mEq/L   Potassium 3.6  3.5 - 5.1 mEq/L   Chloride 105  98 - 107 mEq/L   CO2 25  22 - 29 mEq/L   Glucose 194 (*) 70 - 99 mg/dl   BUN 40.9  7.0 - 81.1 mg/dL   Creatinine 1.0  0.7 - 1.3 mg/dL   Total Bilirubin 9.14 (*) 0.20 - 1.20 mg/dL   Alkaline Phosphatase 32 (*) 40 - 150 U/L   AST 22  5 - 34 U/L   ALT 20  0 - 55 U/L   Total Protein 6.7  6.4 - 8.3 g/dL   Albumin 3.3 (*) 3.5 - 5.0 g/dL   Calcium 9.0  8.4 - 78.2 mg/dL     Studies/Results: CT ABDOMEN WITH CONTRAST 02-16-13  Comparison: 08/19/2012  Findings: Lung bases: Normal  Abdomen: Similar 8 mm low density hepatic lobe lesion on image  7/series 2.  5 mm low density right hepatic lobe lesion on image 18/series 2,  also unchanged. Question atrophy of the left lobe of the liver,  similar. Splenectomy and partial gastrectomy with  gastrojejunostomy. The remaining stomach is underdistended.  Tiny cystic lesion within the pancreatic neck on image 20/series 2  is suspected. Unchanged. Cholecystectomy without biliary ductal  dilatation. Normal adrenal glands and kidneys.  Paucity of retroperitoneal and intraperitoneal fat. Given this  factor, no retroperitoneal adenopathy. Cannot exclude a left  periaortic 8 mm node on image 34/series 2.  Colonic stool burden suggests constipation.  Normal abdominal small bowel without ascites.  Bones/Musculoskeletal: No acute osseous abnormality.  IMPRESSION:  1. Status post splenectomy and  partial gastrectomy, without  evidence of metastatic disease.  2. Slightly decreased sensitivity, secondary to paucity of  abdominal fat.  3. Possible constipation.  4. Suspect a tiny cystic lesion in the pancreatic neck, unchanged.  Most likely a small pseudocyst.   Medications: I have reviewed the patient's current medications. He continues gleevec 100 mg daily. He was given B12 injection today 1000 mcg, this due monthly.  Assessment/Plan: 1. GIST: history as above, most recently recurrent 2012 with surgery Dec 2012. Continue gleevec 100 mg daily. Flu vaccine yearly as he has had splenectomy.  He should have imaging every 6 months until out 5 years from most recent GIST recurrence, which will be Dec 2017. I will set up return visit to this office in 4 months coordinating with B12; he may decide to have all follow up for the GIST back with Dr Lenis Noon at [facility]. 2. Extensive GI resections for GIST such that he requires IM B12 and cannot absorb oral iron. Continue monthly B12 at this office as drug supply is again available. Intolerant to feraheme with extensive rash, did tolerate inferon. Hgb good now. 3.Renal carcinoma: T1a at partial nephrectomy as above. UA not remarkable, last CT likewise 4.decreased distance vision right eye: I have encouraged him to have this evaluated by ophthalmology  Patient can be noncompliant, but has done remarkable well with his problems and hopefully this office can continue to be involved in his care.   Eliana Lueth P, MD   04/13/2013, 1:06 PM

## 2013-04-13 NOTE — Telephone Encounter (Signed)
GAve referral to HIM for [facility] , Dr Lenis Noon

## 2013-04-13 NOTE — Telephone Encounter (Signed)
Gave pt appt for lab, injectionand MD visit for July, August , September and October 2014

## 2013-04-15 ENCOUNTER — Telehealth: Payer: Self-pay | Admitting: Oncology

## 2013-04-15 NOTE — Telephone Encounter (Signed)
Pt appt. To see Dr. Lenis Noon @ [facility] is 05/08/13@9 :15.  Medical records faxed. Pt is aware

## 2013-04-19 DIAGNOSIS — E538 Deficiency of other specified B group vitamins: Secondary | ICD-10-CM | POA: Insufficient documentation

## 2013-05-13 ENCOUNTER — Ambulatory Visit (HOSPITAL_BASED_OUTPATIENT_CLINIC_OR_DEPARTMENT_OTHER): Payer: BC Managed Care – PPO

## 2013-05-13 ENCOUNTER — Other Ambulatory Visit (HOSPITAL_BASED_OUTPATIENT_CLINIC_OR_DEPARTMENT_OTHER): Payer: BC Managed Care – PPO

## 2013-05-13 VITALS — BP 121/86 | HR 69 | Temp 98.9°F

## 2013-05-13 DIAGNOSIS — E538 Deficiency of other specified B group vitamins: Secondary | ICD-10-CM

## 2013-05-13 DIAGNOSIS — C49A0 Gastrointestinal stromal tumor, unspecified site: Secondary | ICD-10-CM

## 2013-05-13 DIAGNOSIS — C494 Malignant neoplasm of connective and soft tissue of abdomen: Secondary | ICD-10-CM

## 2013-05-13 LAB — CBC WITH DIFFERENTIAL/PLATELET
BASO%: 1.3 % (ref 0.0–2.0)
LYMPH%: 35.9 % (ref 14.0–49.0)
MCHC: 33.6 g/dL (ref 32.0–36.0)
MONO#: 0.9 10*3/uL (ref 0.1–0.9)
MONO%: 15.4 % — ABNORMAL HIGH (ref 0.0–14.0)
NEUT#: 2.7 10*3/uL (ref 1.5–6.5)
Platelets: 248 10*3/uL (ref 140–400)
RBC: 4.46 10*6/uL (ref 4.20–5.82)
RDW: 13.8 % (ref 11.0–14.6)
WBC: 5.9 10*3/uL (ref 4.0–10.3)

## 2013-05-13 LAB — COMPREHENSIVE METABOLIC PANEL (CC13)
ALT: 21 U/L (ref 0–55)
Albumin: 3.6 g/dL (ref 3.5–5.0)
Alkaline Phosphatase: 34 U/L — ABNORMAL LOW (ref 40–150)
Potassium: 3.6 mEq/L (ref 3.5–5.1)
Sodium: 142 mEq/L (ref 136–145)
Total Bilirubin: 1.4 mg/dL — ABNORMAL HIGH (ref 0.20–1.20)
Total Protein: 6.8 g/dL (ref 6.4–8.3)

## 2013-05-13 MED ORDER — CYANOCOBALAMIN 1000 MCG/ML IJ SOLN
1000.0000 ug | Freq: Once | INTRAMUSCULAR | Status: AC
  Administered 2013-05-13: 1000 ug via INTRAMUSCULAR

## 2013-06-16 ENCOUNTER — Telehealth: Payer: Self-pay | Admitting: Oncology

## 2013-06-16 NOTE — Telephone Encounter (Signed)
pt did not want to come to aug appt...will come to already sched sept appt

## 2013-06-17 ENCOUNTER — Other Ambulatory Visit: Payer: BC Managed Care – PPO

## 2013-06-17 ENCOUNTER — Ambulatory Visit: Payer: BC Managed Care – PPO

## 2013-07-15 ENCOUNTER — Ambulatory Visit: Payer: BC Managed Care – PPO

## 2013-07-15 ENCOUNTER — Other Ambulatory Visit: Payer: BC Managed Care – PPO

## 2013-08-08 ENCOUNTER — Other Ambulatory Visit: Payer: Self-pay | Admitting: Oncology

## 2013-08-10 ENCOUNTER — Telehealth: Payer: Self-pay | Admitting: Oncology

## 2013-08-12 ENCOUNTER — Other Ambulatory Visit (HOSPITAL_BASED_OUTPATIENT_CLINIC_OR_DEPARTMENT_OTHER): Payer: BC Managed Care – PPO

## 2013-08-12 ENCOUNTER — Ambulatory Visit (HOSPITAL_BASED_OUTPATIENT_CLINIC_OR_DEPARTMENT_OTHER): Payer: BC Managed Care – PPO | Admitting: Oncology

## 2013-08-12 ENCOUNTER — Ambulatory Visit (HOSPITAL_BASED_OUTPATIENT_CLINIC_OR_DEPARTMENT_OTHER): Payer: BC Managed Care – PPO

## 2013-08-12 ENCOUNTER — Ambulatory Visit: Payer: BC Managed Care – PPO

## 2013-08-12 ENCOUNTER — Encounter: Payer: Self-pay | Admitting: Oncology

## 2013-08-12 ENCOUNTER — Other Ambulatory Visit: Payer: Self-pay | Admitting: Oncology

## 2013-08-12 VITALS — BP 162/88 | HR 71 | Temp 97.7°F | Resp 18 | Ht 72.05 in | Wt 130.7 lb

## 2013-08-12 DIAGNOSIS — C494 Malignant neoplasm of connective and soft tissue of abdomen: Secondary | ICD-10-CM

## 2013-08-12 DIAGNOSIS — C49A0 Gastrointestinal stromal tumor, unspecified site: Secondary | ICD-10-CM

## 2013-08-12 DIAGNOSIS — Z23 Encounter for immunization: Secondary | ICD-10-CM

## 2013-08-12 DIAGNOSIS — C649 Malignant neoplasm of unspecified kidney, except renal pelvis: Secondary | ICD-10-CM

## 2013-08-12 DIAGNOSIS — N63 Unspecified lump in unspecified breast: Secondary | ICD-10-CM

## 2013-08-12 DIAGNOSIS — E538 Deficiency of other specified B group vitamins: Secondary | ICD-10-CM

## 2013-08-12 DIAGNOSIS — Z85528 Personal history of other malignant neoplasm of kidney: Secondary | ICD-10-CM

## 2013-08-12 LAB — COMPREHENSIVE METABOLIC PANEL (CC13)
ALT: 21 U/L (ref 0–55)
Albumin: 3.2 g/dL — ABNORMAL LOW (ref 3.5–5.0)
Anion Gap: 9 mEq/L (ref 3–11)
CO2: 26 mEq/L (ref 22–29)
Chloride: 105 mEq/L (ref 98–109)
Glucose: 209 mg/dl — ABNORMAL HIGH (ref 70–140)
Potassium: 3.8 mEq/L (ref 3.5–5.1)
Sodium: 140 mEq/L (ref 136–145)
Total Protein: 6.4 g/dL (ref 6.4–8.3)

## 2013-08-12 LAB — CBC WITH DIFFERENTIAL/PLATELET
Eosinophils Absolute: 0.1 10*3/uL (ref 0.0–0.5)
MONO#: 0.6 10*3/uL (ref 0.1–0.9)
NEUT#: 2.9 10*3/uL (ref 1.5–6.5)
Platelets: 284 10*3/uL (ref 140–400)
RBC: 4.11 10*6/uL — ABNORMAL LOW (ref 4.20–5.82)
RDW: 14 % (ref 11.0–14.6)
WBC: 5.2 10*3/uL (ref 4.0–10.3)
lymph#: 1.6 10*3/uL (ref 0.9–3.3)

## 2013-08-12 MED ORDER — INFLUENZA VAC SPLIT QUAD 0.5 ML IM SUSP
0.5000 mL | Freq: Once | INTRAMUSCULAR | Status: AC
  Administered 2013-08-12: 0.5 mL via INTRAMUSCULAR
  Filled 2013-08-12: qty 0.5

## 2013-08-12 MED ORDER — CYANOCOBALAMIN 1000 MCG/ML IJ SOLN
1000.0000 ug | Freq: Once | INTRAMUSCULAR | Status: AC
  Administered 2013-08-12: 1000 ug via INTRAMUSCULAR

## 2013-08-12 NOTE — Progress Notes (Signed)
OFFICE PROGRESS NOTE   08/12/2013   Physicians:Y.Lowne, E.Levine, P.Savage, T.Cornett, S.Dahlstedt, J.Perry   INTERVAL HISTORY:  Patient is seen, alone for visit, in follow up of history of recurrent GIST, B12 deficiency due to prior surgery for GIST, and history of early stage renal cell carcinoma. He reports slight nonproductive cough x 4 months without SOB or chest pain, and small nodule at left nipple present for at least several weeks. He has been exposed to mold in house from which he recently moved. He has had no recent obvious infectious problems. He continues Gleevec, tolerating 100mg  daily. The area at the left nipple has not increased in size and is not uncomfortable unless palpated excessively. He has otherwise seemed at recent good baseline physically, with good energy and no other complaints that seem referable to the cancer history. Last CT abdomen was 02-16-13; last PET in Jan 2011. Last B12 injection was 05-13-13, optimally would be monthly.   ONCOLOGIC HISTORY History is of GIST initially disgnosed 2007, with subtotal gastrectomy with Roux-en-Y, then briefly on study with Gleevec which he did not tolerate due to marked elevations in LFTs. He had recurrent GIST in late 2012 with further surgery by Dr.Levine March 2011, then rapid elevation of LFTs with 50% dosing (200 mg/d) gleevec, with gleevec again discontinued after short period in 2011. He had further progression in June 2012, did tolerate gleevec at 100mg /d with initial prednisone, with adequate shrinkage of tumor to allow surgery by Dr. Lenis Noon Sep 27, 2011. At laparotomy he had extensive adhesions but no gross peritoneal, hepatic or bowel involvement. The mass itself was cystic and soft and not densely adherent to any visceral structures. Pathology([facility] [phone]) recurrent GIST 8.9 cm with resection margins not involved and comment that microscopically this exhibits an epithelial and glandular growth pattern which differs  from previous specimens; nuclei medium-sized and bland; immunohistochemical panel postive for c-kit. No mitotic index is reported. He saw Dr.Levine last in Dec 2012. Last CT abdomen was in [facility] system 02-16-2013; last PET Jan 2011.  Henry [name] has needed B12 injections due to gastric/ bowel resection. He was iron deficient related to surgeries and blood draws, previously had marked skin reaction to feraheme, but did tolerate inferon on 12-10-2011 with no difficulty.  He also has history of T1a renal cell carcinoma, treated with partial right nephrectomy in 2008 and not active since then.   Review of systems as above, also: Good appetite, tolerates small amounts very regularly. Bowels ok. No pain. No bleeding. No swelling extremities. Remainder of 10 point Review of Systems negative.  He is now living in [place], [place], but does want to continue care in [place].  Objective:  Vital signs in last 24 hours:  BP 162/88  Pulse 71  Temp(Src) 97.7 F (36.5 C) (Oral)  Resp 18  Ht 6' 0.05" (1.83 m)  Wt 130 lb 11.2 oz (59.285 kg)  BMI 17.7 kg/m2 Weight is down 1 lb. Alert, oriented and appropriate. Easily and quickly mobile.    HEENT:PERRL, sclerae not icteric. Oral mucosa moist without lesions, posterior pharynx clear.  Neck supple. No JVD.  Lymphatics:no cervical,suraclavicular, right axillary or inguinal adenopathy. Soft tissue low left axilla questionably a node, 1 cm. Resp: clear to auscultation bilaterally and normal percussion bilaterally. No use of accessory muscles. Cardio: regular rate and rhythm. No gallop. GI: scaphoid, soft, nontender, not distended, no appreciable mass or organomegaly.  Bowel sounds [present and normally active. Surgical scars not remarkable. Musculoskeletal/ Extremities: without pitting edema, cords,  tenderness. Back not tender Neuro/psych: as above, otherwise nonfocal Skin without rash, ecchymosis, petechiae Breasts: no gynecomastia. SQ nodule ~ 0.5 cm  diameter adjacent to left areola at 3:00, not fixed, not tender   Lab Results:  Results for orders placed in visit on 08/12/13  CBC WITH DIFFERENTIAL      Result Value Range   WBC 5.2  4.0 - 10.3 10e3/uL   NEUT# 2.9  1.5 - 6.5 10e3/uL   HGB 12.2 (*) 13.0 - 17.1 g/dL   HCT 16.1 (*) 09.6 - 04.5 %   Platelets 284  140 - 400 10e3/uL   MCV 90.4  79.3 - 98.0 fL   MCH 29.7  27.2 - 33.4 pg   MCHC 32.8  32.0 - 36.0 g/dL   RBC 4.09 (*) 8.11 - 9.14 10e6/uL   RDW 14.0  11.0 - 14.6 %   lymph# 1.6  0.9 - 3.3 10e3/uL   MONO# 0.6  0.1 - 0.9 10e3/uL   Eosinophils Absolute 0.1  0.0 - 0.5 10e3/uL   Basophils Absolute 0.1  0.0 - 0.1 10e3/uL   NEUT% 55.3  39.0 - 75.0 %   LYMPH% 30.3  14.0 - 49.0 %   MONO% 11.2  0.0 - 14.0 %   EOS% 1.2  0.0 - 7.0 %   BASO% 2.0  0.0 - 2.0 %  COMPREHENSIVE METABOLIC PANEL (CC13)      Result Value Range   Sodium 140  136 - 145 mEq/L   Potassium 3.8  3.5 - 5.1 mEq/L   Chloride 105  98 - 109 mEq/L   CO2 26  22 - 29 mEq/L   Glucose 209 (*) 70 - 140 mg/dl   BUN 8.0  7.0 - 78.2 mg/dL   Creatinine 1.0  0.7 - 1.3 mg/dL   Total Bilirubin 9.56  0.20 - 1.20 mg/dL   Alkaline Phosphatase 29 (*) 40 - 150 U/L   AST 31  5 - 34 U/L   ALT 21  0 - 55 U/L   Total Protein 6.4  6.4 - 8.3 g/dL   Albumin 3.2 (*) 3.5 - 5.0 g/dL   Calcium 8.8  8.4 - 21.3 mg/dL   Anion Gap 9  3 - 11 mEq/L     Studies/Results:   I have ordered CT chest and abdomen to be done in next couple of weeks, coordinating with his travel schedule ([place] Oct 24-[place]).  I also ordered US of left nipple area, which was referred by [facility] scheduling [facility]. I discussed situation directly with MD at [facility], as he may not be able to have mammogram imaging due to essentially no soft tissue across chest wall, and Dr Chilton Si has kindly put directions for technologist into the mammogram order.  Medications: I have reviewed the patient's current medications. Flu vaccine given  today    Assessment/Plan: 1. GIST: history as above, most recently recurrent 2012 with surgery Dec 2012. Continue gleevec 100 mg daily. Flu vaccine yearly as he has had splenectomy. He should have imaging every 6 months until out 5 years from most recent GIST recurrence, which will be Dec 2017, ordered as CT chest and abdomen now (chest due to cough).  Continue Gleevec at same dose due to previous intolerance with higher dosing; LFTs not elevated. 2. Extensive GI resections for GIST such that he requires IM B12 and cannot absorb oral iron. Continue monthly B12 at this office. Intolerant to feraheme with extensive rash, did tolerate inferon. Hgb  a little lower today, but is overdue B12. Will follow.  3.Renal carcinoma: T1a at partial nephrectomy as above. UA ok in June 2014. CT also as above 4.NP cough x 4 months:  GIST and  renal cell history, past tobacco. CT chest. 5. Tiny nodule at left areola: imaging as possible, at [facility] per radiology, and will also ask Dr Luisa Hart to see again (known to him from initial GIST presentation). 6. Flu vaccine given today. Note is post splenectomy with GIST surgery.    Patient is in agreement with plan    Reece Packer, MD   08/12/2013, 2:35 PM

## 2013-08-12 NOTE — Patient Instructions (Signed)
Flu vaccine today  B12 monthly  Will set up CT chest and abdomen (tomorrow if you prefer or) just after you get back from [place]. Will get ultrasound of left nipple area with scans and let Dr Luisa Hart check this.

## 2013-08-13 ENCOUNTER — Ambulatory Visit: Payer: BC Managed Care – PPO

## 2013-08-14 ENCOUNTER — Telehealth: Payer: Self-pay | Admitting: Oncology

## 2013-08-14 ENCOUNTER — Other Ambulatory Visit: Payer: Self-pay | Admitting: Oncology

## 2013-08-14 NOTE — Telephone Encounter (Signed)
, °

## 2013-08-22 ENCOUNTER — Other Ambulatory Visit: Payer: Self-pay | Admitting: Oncology

## 2013-08-28 ENCOUNTER — Encounter (HOSPITAL_COMMUNITY): Payer: Self-pay

## 2013-08-28 ENCOUNTER — Other Ambulatory Visit: Payer: Self-pay | Admitting: Oncology

## 2013-08-28 ENCOUNTER — Ambulatory Visit
Admission: RE | Admit: 2013-08-28 | Discharge: 2013-08-28 | Disposition: A | Discharge status: Home / Self Care | Payer: BC Managed Care – PPO | Source: Ambulatory Visit | Attending: Oncology | Admitting: Oncology

## 2013-08-28 ENCOUNTER — Ambulatory Visit (HOSPITAL_COMMUNITY)
Admission: RE | Admit: 2013-08-28 | Discharge: 2013-08-28 | Disposition: A | Discharge status: Home / Self Care | Payer: BC Managed Care – PPO | Source: Ambulatory Visit | Attending: Oncology | Admitting: Oncology

## 2013-08-28 DIAGNOSIS — C649 Malignant neoplasm of unspecified kidney, except renal pelvis: Secondary | ICD-10-CM

## 2013-08-28 DIAGNOSIS — Z09 Encounter for follow-up examination after completed treatment for conditions other than malignant neoplasm: Secondary | ICD-10-CM | POA: Insufficient documentation

## 2013-08-28 DIAGNOSIS — N63 Unspecified lump in unspecified breast: Secondary | ICD-10-CM

## 2013-08-28 DIAGNOSIS — C49A0 Gastrointestinal stromal tumor, unspecified site: Secondary | ICD-10-CM

## 2013-08-28 DIAGNOSIS — Z9089 Acquired absence of other organs: Secondary | ICD-10-CM | POA: Insufficient documentation

## 2013-08-28 DIAGNOSIS — R059 Cough, unspecified: Secondary | ICD-10-CM | POA: Insufficient documentation

## 2013-08-28 DIAGNOSIS — Z903 Acquired absence of stomach [part of]: Secondary | ICD-10-CM | POA: Insufficient documentation

## 2013-08-28 DIAGNOSIS — Z85028 Personal history of other malignant neoplasm of stomach: Secondary | ICD-10-CM | POA: Insufficient documentation

## 2013-08-28 DIAGNOSIS — I700 Atherosclerosis of aorta: Secondary | ICD-10-CM | POA: Insufficient documentation

## 2013-08-28 DIAGNOSIS — R05 Cough: Secondary | ICD-10-CM | POA: Insufficient documentation

## 2013-08-28 DIAGNOSIS — Z85528 Personal history of other malignant neoplasm of kidney: Secondary | ICD-10-CM | POA: Insufficient documentation

## 2013-08-28 MED ORDER — IOHEXOL 300 MG/ML  SOLN
100.0000 mL | Freq: Once | INTRAMUSCULAR | Status: AC | PRN
  Administered 2013-08-28: 100 mL via INTRAVENOUS

## 2013-08-31 ENCOUNTER — Telehealth: Payer: Self-pay | Admitting: Oncology

## 2013-08-31 ENCOUNTER — Ambulatory Visit (HOSPITAL_BASED_OUTPATIENT_CLINIC_OR_DEPARTMENT_OTHER): Payer: Self-pay | Admitting: Oncology

## 2013-08-31 VITALS — BP 106/66 | HR 92 | Temp 98.9°F | Resp 18 | Ht 72.0 in | Wt 133.2 lb

## 2013-08-31 DIAGNOSIS — C494 Malignant neoplasm of connective and soft tissue of abdomen: Secondary | ICD-10-CM

## 2013-08-31 NOTE — Telephone Encounter (Signed)
, °

## 2013-09-07 ENCOUNTER — Ambulatory Visit (INDEPENDENT_AMBULATORY_CARE_PROVIDER_SITE_OTHER): Payer: BC Managed Care – PPO | Admitting: Surgery

## 2013-09-07 ENCOUNTER — Encounter (INDEPENDENT_AMBULATORY_CARE_PROVIDER_SITE_OTHER): Payer: Self-pay | Admitting: Surgery

## 2013-09-07 ENCOUNTER — Ambulatory Visit (HOSPITAL_BASED_OUTPATIENT_CLINIC_OR_DEPARTMENT_OTHER): Payer: BC Managed Care – PPO

## 2013-09-07 VITALS — BP 122/83 | HR 71 | Temp 97.1°F | Resp 18

## 2013-09-07 VITALS — BP 118/82 | HR 80 | Temp 98.6°F | Resp 15 | Ht 72.5 in | Wt 132.4 lb

## 2013-09-07 DIAGNOSIS — C494 Malignant neoplasm of connective and soft tissue of abdomen: Secondary | ICD-10-CM

## 2013-09-07 DIAGNOSIS — N6320 Unspecified lump in the left breast, unspecified quadrant: Secondary | ICD-10-CM

## 2013-09-07 DIAGNOSIS — E538 Deficiency of other specified B group vitamins: Secondary | ICD-10-CM

## 2013-09-07 DIAGNOSIS — N63 Unspecified lump in unspecified breast: Secondary | ICD-10-CM

## 2013-09-07 DIAGNOSIS — C49A0 Gastrointestinal stromal tumor, unspecified site: Secondary | ICD-10-CM

## 2013-09-07 MED ORDER — CYANOCOBALAMIN 1000 MCG/ML IJ SOLN
1000.0000 ug | Freq: Once | INTRAMUSCULAR | Status: AC
Start: 2013-09-07 — End: 2013-09-07
  Administered 2013-09-07: 1000 ug via INTRAMUSCULAR

## 2013-09-07 NOTE — Patient Instructions (Signed)
Lumpectomy A lumpectomy is a form of "breast conserving" or "breast preservation" surgery. It may also be referred to as a partial mastectomy. During a lumpectomy, the portion of the breast that contains the cancerous tumor or breast mass (the lump) is removed. Some normal tissue around the lump may also be removed to make sure all the tumor has been removed. This surgery should take 40 minutes or less. LET YOUR HEALTH CARE PROVIDER KNOW ABOUT:  Any allergies you have.  All medicines you are taking, including vitamins, herbs, eye drops, creams, and over-the-counter medicines.  Previous problems you or members of your family have had with the use of anesthetics.  Any blood disorders you have.  Previous surgeries you have had.  Medical conditions you have. RISKS AND COMPLICATIONS Generally, this is a safe procedure. However, as with any procedure, complications can occur. Possible complications include:  Bleeding.  Infection.  Pain.  Temporary swelling.  Change in the shape of the breast, particularly if a large portion is removed. BEFORE THE PROCEDURE  Ask your health care provider about changing or stopping your regular medicines.  Do not eat or drink anything for 7 8 hours before the surgery or as directed by your health care provider. Ask your health care provider if you can take a sip of water with any approved medicines.  On the day of surgery, your healthcare provider will use a mammogram or ultrasound to locate and mark the tumor in your breast. These markings on your breast will show where the cut (incision) will be made. PROCEDURE   An IV tube will be put into one of your veins.  You may be given medicine to help you relax before the surgery (sedative). You will be given one of the following:  A medicine that numbs the area (local anesthesia).  A medicine that makes you go to sleep (general anesthesia).  Your health care provider will use a kind of electric scalpel  that uses heat to minimize bleeding (electrocautery knife).  A curved incision (like a smile or frown) that follows the natural curve of your breast is made, to allow for minimal scarring and better healing.  The tumor will be removed with some of the surrounding tissue. This will be sent to the lab for analysis. Your health care provider may also remove your lymph nodes at this time if needed.  Sometimes, but not always, a rubber tube called a drain will be surgically inserted into your breast area or armpit to collect excess fluid that may accumulate in the space where the tumor was. This drain is connected to a plastic bulb on the outside of your body. This drain creates suction to help remove the fluid.  The incisions will be closed with stitches (sutures).  A bandage may be placed over the incisions. AFTER THE PROCEDURE  You will be taken to the recovery area.  You will be given medicine for pain.  A small rubber drain may be placed in the breast for 2 3 days to prevent a collection of blood (hematoma) from developing in the breast. You will be given instructions on caring for the drain before you go home.  A pressure bandage (dressing) will be applied for 1 2 days to prevent bleeding. Ask your health care provider how to care for your bandage at home. Document Released: 11/19/2006 Document Revised: 06/10/2013 Document Reviewed: 03/13/2013 [employer] Patient Information 2014 [employer].  

## 2013-09-07 NOTE — Progress Notes (Signed)
Patient ID: Henry [name], male   DOB: [birth year], 62 y.o.   MRN: [id number]  Chief Complaint  Patient presents with  . New Evaluation    eval left nipple nodule/mass    HPI Henry [name] is a 62 y.o. male.  Patient sent at the request of Dr. Hulan Saas for a left breast mass. 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 He has had 2 operations for his GI stromal tumor since 2007. He has been maintained on Gleevec recently. He is doing well overall. The mass been present for 6 months. It's not getting larger. It is not tender.. Mammography reveals this to be a 1 cm well demarcated mass just under the nipple. Biopsy was recommended but the patient wished to have it excised given his complex medical history. HPI  Past Medical History  Diagnosis Date  . 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 Pneumothorax, spontaneous, tension 1992    TREATED WITH A CHEST TUBE  . Cancer NOVEMBER 2008    T1a PAPILLARY RENAL CELL CARCINOMA  . B12 nutritional deficiency     RELATED TO PREVIOUS SURGRIES/ ON MONTHLY SUPPLEMENT  . Depression NOTED IN NOTE 05-28-11    HISTORY OF MANIC  DEPRESSIVE TYPE DISORDER  . Anemia 2008    RESOLVED  WITH IV IRON DEXTRAN AS HE WAS UNABLE  TO ABSORB  ORAL IRON.      Past Surgical History  Procedure Laterality Date  . Bilroth ii procedure  12-31-2005  . Cholecystectomy  12-31-2005    DONE WITH GASTRECTOMY BY DR. Maisie Fus Emmelia Holdsworth  . Partial nephrectomy  NOVEMBER 2008    RIGHT SIDE/ FOR T1a PAPILLARY RENAL CELL CARCINOMA  . Tumor removal  MARCH 2011    FOR RECURRENT GIST BY DR. Lenis Noon AT [facility]    Family History  Problem Relation Age of Onset  . Birth defects Mother     lung, ovarian, breast    Social History History  Substance Use Topics  . Smoking status: Former Smoker -- 1.50 packs/day for 25 years    Quit date: 07/03/1989  . Smokeless tobacco: Not on file  . Alcohol Use: No     Allergies  Allergen Reactions  . Feraheme [Ferumoxytol]     Current Outpatient Prescriptions  Medication Sig Dispense Refill  . b complex vitamins capsule Take 1 capsule by mouth daily.      . Cyanocobalamin (VITAMIN B-12 IJ) Inject 1,000 mcg as directed every 30 (thirty) days. RECEIVES INJECTION AT CAN[facility]ER CENTER       . imatinib (GLEEVEC) 100 MG tablet Take 1 tablet (100 mg total) by mouth daily. Take with meals and large glass of water.Caution:Chemotherapy  30 tablet  6   No current facility-administered medications for this visit.    Review of Systems Review of Systems  Constitutional: Negative.   HENT: Negative.   Eyes: Negative.   Respiratory: Negative.   Cardiovascular: Negative.   Gastrointestinal: Negative.   Endocrine: Negative.   Genitourinary: Negative.   Allergic/Immunologic: Negative.   Neurological: Negative.   Hematological: Negative.   Psychiatric/Behavioral: Negative.     Blood pressure 118/82, pulse 80, temperature 98.6 F (37 C), temperature source Temporal, resp. rate 15, height 6' 0.5" (1.842 m), weight 132 lb 6.4 oz (60.056 kg).  Physical Exam Physical Exam  Constitutional: He appears well-developed and well-nourished.  HENT:  Head: Normocephalic and atraumatic.  Eyes: Pupils are equal, round, and reactive to  light. No scleral icterus.  Neck: Normal range of motion.  Pulmonary/Chest:    Abdominal:    Lymphadenopathy:    He has no axillary adenopathy.    Data Reviewed CC and MLO views of both breasts and a spot tangential view of the  left breast in the area of palpable concern were obtained.  Corresponding to the area of palpable concern is a vague mass with  associated microcalcifications. Benign appearing calcifications are  also identified adjacent to the mass. There is evidence of mild to  moderate bilateral gynecomastia.  Mammographic images were processed with CAD.  On physical exam,there is a 1 cm palpable lump in the upper  outer  periareolar left breast.  Ultrasound is performed, showing an oval-shaped slightly lobulated  heterogeneous mass with microcalcifications at the 2:30 o'clock  position of the left breast approximately 1 cm from the nipple  measuring approximately 0.6 x 1.0 x 0.9 cm. This corresponds to the  area of palpable concern.  IMPRESSION:  1 cm slightly lobulated mass with microcalcifications at the 2:30  o'clock left breast approximately 1 cm from the nipple corresponding  to the area of palpable concern.  RECOMMENDATION:  Biopsy of the mass was discussed with the patient. He was given the  option of core needle biopsy and excisional biopsy. He wishes to  proceed with excisional biopsy. As the patient has seen Dr. Luisa Hart  of [facility] previously, an appointment has been made  with him on November 21 at 9:45 a.m.  I have discussed the findings and recommendations with the patient.  Results were also provided in writing at the conclusion of the  visit.  BI-RADS CATEGORY 4: Suspicious abnormality - biopsy should be  considered.  Electronically Signed  By: Hulan Saas M.D.  On: 08/28/2013 10:18   Assessment    Left breast mass 1 cm border of left nipple 1:00 history of GI stromal tumor status post initial resection in 2007 and recurrence 2010 requiring resection now on Gleevec    Plan    Patient wishes to have mass excised. We'll schedule for excisional left breast biopsy.The procedure has been discussed with the patient.  Alternative therapies have been discussed with the patient.  Operative risks include bleeding,  Infection,  Organ injury,  Nerve injury,  Blood vessel injury,  DVT,  Pulmonary embolism,  Death,  And possible reoperation.  Medical management risks include worsening of present situation.  The success of the procedure is 50 -100 % at treating patients symptoms.  The patient understands and agrees to proceed.       Apurva Reily A. 09/07/2013,  10:11 AM

## 2013-09-09 ENCOUNTER — Telehealth: Payer: Self-pay | Admitting: Oncology

## 2013-09-09 ENCOUNTER — Telehealth: Payer: Self-pay

## 2013-09-09 ENCOUNTER — Other Ambulatory Visit: Payer: Self-pay | Admitting: Oncology

## 2013-09-09 ENCOUNTER — Ambulatory Visit: Payer: BC Managed Care – PPO

## 2013-09-09 DIAGNOSIS — C49A0 Gastrointestinal stromal tumor, unspecified site: Secondary | ICD-10-CM

## 2013-09-09 NOTE — Telephone Encounter (Signed)
Left message that Dr. Darrold Span said to cancell appointment with her on 09-14-13 as the biopsy will be preformed  On 09-23-13. He needs to call our schedulers to set up visit on 09-28-13 or 10-02-13 with B12 injection to follow visit. POFsent to schedulers with scheduling  information

## 2013-09-09 NOTE — Telephone Encounter (Signed)
returned pt call and advised on 12.8.14 appt....pt wants labs done that day..emailed Dr. Cleophas Dunker to add order if needed

## 2013-09-10 ENCOUNTER — Telehealth: Payer: Self-pay | Admitting: Oncology

## 2013-09-10 ENCOUNTER — Other Ambulatory Visit: Payer: Self-pay | Admitting: Oncology

## 2013-09-10 NOTE — Telephone Encounter (Signed)
, °

## 2013-09-11 ENCOUNTER — Ambulatory Visit (INDEPENDENT_AMBULATORY_CARE_PROVIDER_SITE_OTHER): Payer: BC Managed Care – PPO | Admitting: Surgery

## 2013-09-14 ENCOUNTER — Ambulatory Visit: Payer: BC Managed Care – PPO | Admitting: Oncology

## 2013-09-22 ENCOUNTER — Telehealth (INDEPENDENT_AMBULATORY_CARE_PROVIDER_SITE_OTHER): Payer: Self-pay | Admitting: General Surgery

## 2013-09-22 NOTE — Telephone Encounter (Signed)
Ok with i stat

## 2013-09-22 NOTE — Telephone Encounter (Signed)
Henry [name], at [facility], called to report this pt has not gone in for his pre-op lab work and they cannot reach him for reminder.  Researched demographics and provided her the emergency contact information.  Also asked to get "OK" from Dr. Luisa Hart to obtain a I-stat (chem with hgb) on DOS prn.   Please advise.

## 2013-09-22 NOTE — Telephone Encounter (Signed)
[facility] able to reach pt; stated he did not go for labs because had some at [facility] in October.  Will these results suffice for upcoming surgery?  Please advise.

## 2013-09-23 ENCOUNTER — Other Ambulatory Visit (INDEPENDENT_AMBULATORY_CARE_PROVIDER_SITE_OTHER): Payer: Self-pay | Admitting: Surgery

## 2013-09-23 ENCOUNTER — Other Ambulatory Visit (INDEPENDENT_AMBULATORY_CARE_PROVIDER_SITE_OTHER): Payer: Self-pay | Admitting: *Deleted

## 2013-09-23 DIAGNOSIS — C50929 Malignant neoplasm of unspecified site of unspecified male breast: Secondary | ICD-10-CM

## 2013-09-23 MED ORDER — HYDROCODONE-ACETAMINOPHEN 5-325 MG PO TABS: 1.0000 | ORAL_TABLET | ORAL | Status: DC | PRN

## 2013-09-23 NOTE — Telephone Encounter (Signed)
Left msg on machine for Henry [name] to call back. Please get I-stat DOS.

## 2013-09-27 ENCOUNTER — Other Ambulatory Visit: Payer: Self-pay | Admitting: Oncology

## 2013-09-28 ENCOUNTER — Telehealth (INDEPENDENT_AMBULATORY_CARE_PROVIDER_SITE_OTHER): Payer: Self-pay | Admitting: *Deleted

## 2013-09-28 ENCOUNTER — Other Ambulatory Visit (HOSPITAL_BASED_OUTPATIENT_CLINIC_OR_DEPARTMENT_OTHER): Payer: BC Managed Care – PPO | Admitting: Lab

## 2013-09-28 ENCOUNTER — Ambulatory Visit (HOSPITAL_BASED_OUTPATIENT_CLINIC_OR_DEPARTMENT_OTHER): Payer: BC Managed Care – PPO | Admitting: Oncology

## 2013-09-28 ENCOUNTER — Encounter: Payer: Self-pay | Admitting: Oncology

## 2013-09-28 ENCOUNTER — Ambulatory Visit (HOSPITAL_BASED_OUTPATIENT_CLINIC_OR_DEPARTMENT_OTHER): Payer: BC Managed Care – PPO

## 2013-09-28 VITALS — BP 130/78 | HR 73 | Temp 98.2°F | Resp 18 | Ht 72.25 in | Wt 132.2 lb

## 2013-09-28 DIAGNOSIS — Z85528 Personal history of other malignant neoplasm of kidney: Secondary | ICD-10-CM

## 2013-09-28 DIAGNOSIS — E538 Deficiency of other specified B group vitamins: Secondary | ICD-10-CM

## 2013-09-28 DIAGNOSIS — I700 Atherosclerosis of aorta: Secondary | ICD-10-CM

## 2013-09-28 DIAGNOSIS — C49A0 Gastrointestinal stromal tumor, unspecified site: Secondary | ICD-10-CM

## 2013-09-28 DIAGNOSIS — C494 Malignant neoplasm of connective and soft tissue of abdomen: Secondary | ICD-10-CM

## 2013-09-28 DIAGNOSIS — R351 Nocturia: Secondary | ICD-10-CM

## 2013-09-28 LAB — CBC WITH DIFFERENTIAL/PLATELET
BASO%: 0.6 % (ref 0.0–2.0)
EOS%: 2.5 % (ref 0.0–7.0)
Eosinophils Absolute: 0.2 10*3/uL (ref 0.0–0.5)
HGB: 12.4 g/dL — ABNORMAL LOW (ref 13.0–17.1)
LYMPH%: 34 % (ref 14.0–49.0)
MCH: 30.2 pg (ref 27.2–33.4)
MCHC: 33.2 g/dL (ref 32.0–36.0)
MCV: 90.9 fL (ref 79.3–98.0)
MONO%: 9.7 % (ref 0.0–14.0)
Platelets: 276 10*3/uL (ref 140–400)
RBC: 4.11 10*6/uL — ABNORMAL LOW (ref 4.20–5.82)
RDW: 13.8 % (ref 11.0–14.6)
lymph#: 2.5 10*3/uL (ref 0.9–3.3)

## 2013-09-28 MED ORDER — CYANOCOBALAMIN 1000 MCG/ML IJ SOLN
1000.0000 ug | Freq: Once | INTRAMUSCULAR | Status: AC
  Administered 2013-09-28: 1000 ug via INTRAMUSCULAR

## 2013-09-28 NOTE — Telephone Encounter (Signed)
Pathology called to make Dr. Luisa Hart aware that biopsy came back as invasive carcinoma.  Explained that I would send him a message to make him aware.

## 2013-09-28 NOTE — Patient Instructions (Signed)
I'll listen out for report from the biopsy by Dr Luisa Hart

## 2013-09-28 NOTE — Telephone Encounter (Signed)
I called pt to check on him postoperatively.  He states he is doing good, does not have any complaints.  I informed him of his post op appt with Dr. Luisa Hart on 12/23 with an arrival time of 11:00am.  He did ask about the results from his pathology report.  I informed him that as soon as Dr. Luisa Hart reviews the results he will contact him.

## 2013-09-28 NOTE — Progress Notes (Signed)
OFFICE PROGRESS NOTE   09/28/2013   Physicians:Y.Lowne, E.Levine, [name].Savage, T.Cornett, S.Dahlstedt, J.Perry   INTERVAL HISTORY:  Patient is seen, alone for visit, in continuing attention to his recurrent GIST for which he continues Gleevec post resections, B12 deficiency related to surgery for GIST, history of renal cell carcinoma not known active and recent left breast mass.  Patient had bilateral mammograms and left breast US at [facility] on 08-28-13, with vague mass with microcalcifications imaged at the palpable 1 cm area. He preferred excisional biopsy and was referred to Dr Luisa Hart. At time of patient's visit today, I learned that he had the [facility]. There is no path yet available that I can locate, and patient has not heard back about path from Dr Luisa Hart as yet. He has had little discomfort at area of biopsy, has not used any pain medication. He had CT CAP also on 08-28-13, which shows no adenopathy, pulmonary nodules, no new changes in kidneys, a 9 mm "probable hemangioma" in right hepatic dome, prostate unremarkable, post surgical changes.  Henry [name] has felt well since I saw him last, only complaint being new voiding x ~ 2 in night, which he thinks may be related to coffee in evenings. He is cutting out some sugar from diet but is aware that he should not cut total calories. He has had no infectious illness, no new or different pain, appetite and energy good, no respiratory complaints.   ONCOLOGIC HISTORY History is of GIST initially disgnosed 2007, with subtotal gastrectomy with Roux-en-Y, then briefly on study with Gleevec which he did not tolerate due to marked elevations in LFTs. He had recurrent GIST in late 2012 with further surgery by Dr.Levine March 2011, then rapid elevation of LFTs with 50% dosing (200 mg/d) gleevec, with gleevec again discontinued after short period in 2011. He had further progression in June 2012, did tolerate  gleevec at 100mg /d with initial prednisone, with adequate shrinkage of tumor to allow surgery by Dr. Lenis Noon Sep 27, 2011. At laparotomy he had extensive adhesions but no gross peritoneal, hepatic or bowel involvement. The mass itself was cystic and soft and not densely adherent to any visceral structures. Pathology([facility] [phone]) recurrent GIST 8.9 cm with resection margins not involved and comment that microscopically this exhibits an epithelial and glandular growth pattern which differs from previous specimens; nuclei medium-sized and bland; immunohistochemical panel postive for c-kit. No mitotic index is reported. He saw Dr.Levine last in Dec 2012. Last CT CAP was in [facility] system 08-28-13; last PET Jan 2011.  Henry [name] has needed B12 injections due to gastric/ bowel resection. He was iron deficient related to surgeries and blood draws, previously had marked skin reaction to feraheme, but did tolerate inferon on 12-10-2011 with no difficulty.  He also has history of T1a renal cell carcinoma, treated with partial right nephrectomy in 2008 and not active since then.    No complaints of cough now. Remainder of 10 point Review of Systems negative.  Objective:  Vital signs in last 24 hours:  BP 130/78  Pulse 73  Temp(Src) 98.2 F (36.8 C) (Oral)  Resp 18  Ht 6' 0.25" (1.835 m)  Wt 132 lb 4 oz (59.988 kg)  BMI 17.82 kg/m2 weight is stable  Alert, oriented and appropriate. Easily ambulatory.    HEENT:PERRL, sclerae not icteric. Oral mucosa moist without lesions, posterior pharynx clear.  Neck supple. No JVD.  Lymphatics:no cervical,suraclavicular, right axillary adenopathy; tiny, shotty lower left  axillary nodes not firm or fixed. Resp: clear to auscultation bilaterally  Cardio: regular rate and rhythm. No gallop. GI: soft, nontender, not distended Musculoskeletal/ Extremities: without pitting edema, cords, tendernes Breasts: left with surgical site clean and dry, no dressing,  resolving ecchymosis surrounding.   Lab Results:  Results for orders placed in visit on 09/28/13  CBC WITH DIFFERENTIAL      Result Value Range   WBC 7.4  4.0 - 10.3 10e3/uL   NEUT# 4.0  1.5 - 6.5 10e3/uL   HGB 12.4 (*) 13.0 - 17.1 g/dL   HCT 96.0 (*) 45.4 - 09.8 %   Platelets 276  140 - 400 10e3/uL   MCV 90.9  79.3 - 98.0 fL   MCH 30.2  27.2 - 33.4 pg   MCHC 33.2  32.0 - 36.0 g/dL   RBC 1.19 (*) 1.47 - 8.29 10e6/uL   RDW 13.8  11.0 - 14.6 %   lymph# 2.5  0.9 - 3.3 10e3/uL   MONO# 0.7  0.1 - 0.9 10e3/uL   Eosinophils Absolute 0.2  0.0 - 0.5 10e3/uL   Basophils Absolute 0.0  0.0 - 0.1 10e3/uL   NEUT% 53.2  39.0 - 75.0 %   LYMPH% 34.0  14.0 - 49.0 %   MONO% 9.7  0.0 - 14.0 %   EOS% 2.5  0.0 - 7.0 %   BASO% 0.6  0.0 - 2.0 %      STUDIES/RESULTS CT CHEST, ABDOMEN, AND PELVIS WITH CONTRAST 08-28-13  COMPARISON: CT abdomen dated 02/16/2013. PET-CT dated 11/11/2009.  FINDINGS:  CT CHEST FINDINGS  No suspicious pulmonary nodules. Paraseptal emphysematous changes.  No pleural effusion or pneumothorax.  Visualized thyroid is unremarkable.  Heart is normal in size. No pericardial effusion. Mild  atherosclerotic calcifications of the aortic arch.  No suspicious mediastinal, hilar, or axillary lymphadenopathy.  Mild degenerative changes of the thoracic spine.  CT ABDOMEN AND PELVIS FINDINGS  Status post subtotal gastrectomy with gastrojejunostomy.  9 mm probable hemangioma in the right hepatic dome (series 2/image  50).  Prior splenectomy.  Pancreas is within normal limits. Tiny lesion at the junction of the  pancreatic head and body noted on the prior study is not currently  evident. Adrenal glands are within normal limits.  Status post cholecystectomy. No intrahepatic or extrahepatic ductal  dilatation.  Right kidney is malrotated. Postsurgical changes anteriorly related  to partial nephrectomy. Left kidney is within normal limits. No  hydronephrosis.  Visualized bowel is  unremarkable. No evidence of bowel obstruction.  Atherosclerotic calcifications of the abdominal aorta and branch  vessels.  No abdominopelvic ascites.  No suspicious abdominopelvic lymphadenopathy.  Prostate is unremarkable.  Bladder is within normal limits.  Very mild degenerative changes of the lower lumbar spine.  IMPRESSION:  Status post subtotal gastrectomy with gastrojejunostomy.  Prior splenectomy, cholecystectomy, and partial right nephrectomy.  No findings to suggest recurrent or metastatic disease in the chest,  abdomen or pelvis.  CT information reviewed with patient and he has been given copy of report for his records.  Medications: I have reviewed the patient's current medications. B12 injection given today, due monthly. Note he did not fill prescription for pain meds after breast biopsy.  Assessment/Plan: 1. GIST: history as above, most recently recurrent 2012 with surgery Dec 2012. Continue gleevec 100 mg daily.  He should have imaging every 6 months until out 5 years from most recent GIST recurrence, which will be Dec 2017.CT CAP done 08-28-13 as above (chest due to previous  complaint of cough). Continue Gleevec at same dose due to previous intolerance with higher dosing; LFTs not elevated on last chemistries.  2. Extensive GI resections for GIST such that he requires IM B12 and cannot absorb oral iron. Continue monthly B12 at this office. Intolerant to feraheme with extensive rash, did tolerate inferon. Hgb stable to slightly improved today, tho he had previously been ~ a gram higher. Will follow.  3.Renal carcinoma: T1a at partial nephrectomy as above. UA ok in June 2014. CT also as above, no prostate enlargement on scan and he will try cutting out coffee at hs for nocturia symptoms. Will check PSA next labs as he is no longer seeing urology and has not had PSA in this EMR 4.NP cough x 4 months prior to the CT: GIST and renal cell history, past tobacco. CT chest not remarkable  in this regard. 5. Post excisional biopsy 09-23-13 of small left breast mass, which had microcalcifications associated on mammogram. No path information available today. I will watch for this information and patient will contact Dr Luisa Hart or this office if he does not hear back by next week. 6. Flu vaccine given. Note is post splenectomy with GIST surgery. 7.atherosclerotic changes in aorta by CT  I will see him with B12 next month or sooner if needed Total time with patient today 10 min.    Henry Chaikin P, MD   09/28/2013, 3:41 PM

## 2013-09-29 ENCOUNTER — Telehealth: Payer: Self-pay | Admitting: Oncology

## 2013-09-29 DIAGNOSIS — I700 Atherosclerosis of aorta: Secondary | ICD-10-CM | POA: Insufficient documentation

## 2013-09-30 NOTE — Telephone Encounter (Signed)
Dr Luisa Hart tried calling pt yesterday. No answer. Left message for him to call him back at office

## 2013-10-01 ENCOUNTER — Telehealth (INDEPENDENT_AMBULATORY_CARE_PROVIDER_SITE_OTHER): Payer: Self-pay

## 2013-10-01 ENCOUNTER — Telehealth: Payer: Self-pay

## 2013-10-01 NOTE — Telephone Encounter (Signed)
Pt returned call to Dr Luisa Hart for path result. Pt advised I will page Dr Luisa Hart with his current phone # to call pt. Pt can be reached at [phone].

## 2013-10-01 NOTE — Telephone Encounter (Signed)
Henry [name] called to let Dr. Darrold Span know that Dr. Luisa Hart just told him that he has a breast cance.  It is small and early.   Dr. Davina Poke said that there needs to be a  lymph node biopsy but this can wait til the new year.  Henry [name] has a follow up appointment with Dr. Luisa Hart 10-13-13.   When does Henry [name] need to see Dr. Darrold Span ?

## 2013-10-02 ENCOUNTER — Encounter: Payer: Self-pay | Admitting: *Deleted

## 2013-10-02 ENCOUNTER — Telehealth: Payer: Self-pay | Admitting: Oncology

## 2013-10-02 ENCOUNTER — Encounter: Payer: Self-pay | Admitting: Oncology

## 2013-10-02 ENCOUNTER — Telehealth: Payer: Self-pay | Admitting: *Deleted

## 2013-10-02 NOTE — Progress Notes (Signed)
Faxed gleevec prescription to [facility] °

## 2013-10-02 NOTE — Telephone Encounter (Signed)
Notified that a new prescription Gleevec was sent to [facility]

## 2013-10-02 NOTE — Telephone Encounter (Signed)
Medical Oncology  Reviewed path now in EMR from left breast biopsy done 09-23-13 [facility] [phone]) with immunohistochemical information also available this AM, and returned patient's call. He is aware of new cancer finding from phone call by Dr Luisa Hart. He understands that Dr Luisa Hart plans lymph node evaluation, possibly to be done in Jan. We have discussed size and grade of the invasive cancer, ER/PR positive and HER 2 negative features, close margins, lack of obvious lymphovascular space invasion. I have mentioned genetics counseling at some point (note mother had breast, lung and cervical cancer; 2 sisters without known cancer; no biological children). We have briefly discussed use of tamoxifen in adjuvant fashion if nodes negative, or chemotherapy if node positive. He is anxious about any delay in starting treatment, tho certainly we do not want any pathology questions if estrogen blocker is begun now. I have LM for pathologists to be sure they are aware of previous GIST and renal cell diagnoses, tho nothing in path report suggests that the area in breast is metastatic from one of the previous malignancies.  He may have insurance complications possibly thru end of Dec, which he is looking into today, as he was notified by specialty pharmacy yesterday that insurance is not covering Gleevec in Dec, next supply of Gleevec due today or tomorrow.  I have asked [facility] financial office to look into the Gleevec concerns. Depending on insurance, he would prefer to have axillary node evaluation as soon as possible.   Patient will let me know if he needs other assistance from this office, and will let me know when surgery is planned. We will let him know about Gleevec availability.   Patient is understandably upset, but appreciated call.  Ila Mcgill, MD

## 2013-10-02 NOTE — Telephone Encounter (Signed)
Medical Oncology  LM for pathologist re prior GIST and renal cell ca, as I cannot tell from path report that they had this information.  Ila Mcgill, MD

## 2013-10-05 ENCOUNTER — Other Ambulatory Visit (INDEPENDENT_AMBULATORY_CARE_PROVIDER_SITE_OTHER): Payer: Self-pay | Admitting: Surgery

## 2013-10-05 ENCOUNTER — Ambulatory Visit: Payer: BC Managed Care – PPO

## 2013-10-05 DIAGNOSIS — C50922 Malignant neoplasm of unspecified site of left male breast: Secondary | ICD-10-CM

## 2013-10-06 ENCOUNTER — Encounter: Payer: Self-pay | Admitting: *Deleted

## 2013-10-06 NOTE — Progress Notes (Signed)
RECEIVED A FAX FROM [facility] CONCERNING A CONFIRMATION OF PRESCRIPTION SHIPMENT FOR GLEEVEC ON 10/05/13.

## 2013-10-07 ENCOUNTER — Other Ambulatory Visit: Payer: Self-pay | Admitting: Oncology

## 2013-10-08 ENCOUNTER — Telehealth: Payer: Self-pay | Admitting: Oncology

## 2013-10-08 NOTE — Telephone Encounter (Signed)
, °

## 2013-10-09 ENCOUNTER — Encounter (HOSPITAL_BASED_OUTPATIENT_CLINIC_OR_DEPARTMENT_OTHER): Payer: Self-pay | Admitting: *Deleted

## 2013-10-13 ENCOUNTER — Encounter (INDEPENDENT_AMBULATORY_CARE_PROVIDER_SITE_OTHER): Payer: Self-pay | Admitting: Surgery

## 2013-10-13 ENCOUNTER — Ambulatory Visit (INDEPENDENT_AMBULATORY_CARE_PROVIDER_SITE_OTHER): Payer: BC Managed Care – PPO | Admitting: Surgery

## 2013-10-13 VITALS — BP 122/71 | HR 62 | Temp 99.6°F | Resp 16 | Ht 72.0 in | Wt 131.2 lb

## 2013-10-13 DIAGNOSIS — Z9889 Other specified postprocedural states: Secondary | ICD-10-CM

## 2013-10-13 NOTE — Patient Instructions (Signed)
Sentinel Lymph Node Analysis in Breast Cancer Treatment WHAT IS A LYMPH NODE? Lymph nodes are little glands that lie along the lymph channels and serve to trap infections in the body. These are the small vessel-like structures that carry the fluid (lymph) from body tissues away to be filtered. These are the "glands" that feel swollen in the neck when you or your child has a sore throat. These glands in your armpit are where breast cancer tends to spread first. WHAT IS SENTINEL LYMPH NODE ANALYSIS? Sentinel lymph node study is a new cancer diagnostic procedure. It aims to look at the most likely first spread of breast cancer. It involves looking at the lymph node or nodes where breast cancer is likely to travel next.  PROCEDURE  A small amount of blue dye and radioactive tracer are injected around the tumor in the breast. The dye and tracer follow the same path that a spreading cancer would be likely to follow. With the use of a Geiger counter, the radioactive tracer can be located in the lymph node that is the gatekeeper to other lymph nodes in the armpit. The sentinel lymph node is the first lymph node in a chain of lymph nodes that drain the lymph from the breast. The blue dye enables the surgeon to identify this sentinel node. This node can be removed and examined. If no cancer is found in this node, no further removal of lymph nodes is recommended. In this case, the spread of cancer to the other lymph nodes is rare. This eliminates any more surgery in the armpit and risk of complications. If the lymph node shows spread of the cancer from the breast, the other lymph nodes in the armpit are removed and analyzed. This helps the doctor and patient decide how much more surgery is needed and if chemotherapy and/or radiation treatment is necessary following the surgery.  BENEFITS  The pathology tests for this procedure are much more sensitive than was previously available.  This technique is thought to be a  major improvement in the treatment of breast cancer.  This procedure allows many patients to avoid the effects of more extensive underarm lymph node removal and risk of complications (infection, bleeding, or severe arm swelling).  Survival rates from breast cancer are better and the risk of complications isreduced. Document Released: 10/08/2005 Document Revised: 12/31/2011 Document Reviewed: 06/24/2007 [employer] Patient Information 2014 [employer].  

## 2013-10-13 NOTE — Progress Notes (Signed)
Patient ID: Henry [name], male   DOB: 01/21/1951, 62 y.o.   MRN: [id number]  Chief Complaint  Patient presents with  . Follow-up    HPI Henry [name] is a 62 y.o. male.  Patient returns after left breast lumpectomy. 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  Patient here today to discuss reexcision and sentinel lymph node mapping. HPI  Past Medical History  Diagnosis Date  . 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 Pneumothorax, spontaneous, tension 1992    TREATED WITH A CHEST TUBE  . Cancer NOVEMBER 2008    T1a PAPILLARY RENAL CELL CARCINOMA  . B12 nutritional deficiency     RELATED TO PREVIOUS SURGRIES/ ON MONTHLY SUPPLEMENT  . Depression NOTED IN NOTE 05-28-11    HISTORY OF MANIC  DEPRESSIVE TYPE DISORDER  . Anemia 2008    RESOLVED  WITH IV IRON DEXTRAN AS HE WAS UNABLE  TO ABSORB  ORAL IRON.      Past Surgical History  Procedure Laterality Date  . Bilroth ii procedure  12-31-2005  . Cholecystectomy  12-31-2005    DONE WITH GASTRECTOMY BY DR. Lakeidra Reliford  . Partial nephrectomy  NOVEMBER 2008    RIGHT SIDE/ FOR T1a PAPILLARY RENAL CELL CARCINOMA  . Tumor removal  MARCH 2011    FOR RECURRENT GIST BY DR. LEVINE AT [facility]    Family History  Problem Relation Age of Onset  . Birth defects Mother     lung, ovarian, breast    Social History History  Substance Use Topics  . Smoking status: Former Smoker -- 1.50 packs/day for 25 years    Quit date: 07/03/1989  . Smokeless tobacco: Not on file  . Alcohol Use: No    Allergies  Allergen Reactions  . Feraheme [Ferumoxytol]     Current Outpatient Prescriptions  Medication Sig Dispense Refill  . b complex vitamins capsule Take 1 capsule by mouth daily.      . Cyanocobalamin (VITAMIN B-12 IJ) Inject 1,000 mcg as directed every 30 (thirty) days. RECEIVES INJECTION AT CANCER CENTER       . imatinib (GLEEVEC) 100 MG tablet Take 1 tablet  (100 mg total) by mouth daily. Take with meals and large glass of water.Caution:Chemotherapy  30 tablet  6   No current facility-administered medications for this visit.    Review of Systems Review of Systems  Constitutional: Negative.     Blood pressure 122/71, pulse 62, temperature 99.6 F (37.6 C), temperature source Temporal, resp. rate 16, height 6' (1.829 m), weight 131 lb 3.2 oz (59.512 kg).  Physical Exam Physical Exam  Constitutional: He is oriented to person, place, and time. He appears well-developed and well-nourished.  Pulmonary/Chest:    Neurological: He is alert and oriented to person, place, and time.  Skin: Skin is warm.  Psychiatric: He has a normal mood and affect. His behavior is normal. Judgment and thought content normal.    Data Reviewed Breast, biopsy, mass, left - INVASIVE DUCTAL CARCINOMA, 1 CM. - DUCTAL CARCINOMA IN SITU. - FINDINGS CONSISTENT WITH GYNECOMASTIA. Microscopic Comment BREAST, INVASIVE TUMOR, WITHOUT LYMPH NODE SAMPLING Specimen, including laterality and lymph node sampling (sentinel, non-sentinel): Left breast without sentinel lymph nodes Procedure: Excisional biopsy Histologic type: Ductal Grade: II Tubule formation: 2 Nuclear pleomorphism: 2 Mitotic: 2 Tumor size (gross measurement): 1 cm Margins: Invasive, distance to closest margin: Less than 0.1 cm. In-situ, distance to closest margin: Less than   0.1 cm If margin positive, focally or broadly: N/A Lymphovascular invasion: Not identified. Ductal carcinoma in situ: Grade: Intermediate Extensive intraductal component: No Lobular neoplasia: No Tumor focality: Unifocal Treatment effect: No If present, treatment effect in breast tissue, lymph nodes or both: N/A Extent of tumor: Skin: N/A Nipple: N/A Skeletal muscle: N/A Breast prognostic profile: Pending Estrogen receptor: Pending Progesterone receptor: Pending Her 2 neu: Pending Ki-67: Pending Non-neoplastic breast:  Gynecomastia TNM: pT1b, pNX, pMX 2 of 3 FINAL for Rodenbaugh, Jomarion C ([id number]) Microscopic Comment(continued) Comments: The tumor is well circumscribed, 1 cm in greatest dimension and shows lack of basal cell staining with p63, smooth muscle myosin and calponin consistent with invasive ductal carcinoma. The tumor is strongly estrogen receptor positive and positive with gross cystic disease fluid protein. The invasive and in situ carcinoma is focally less than 0.1 cm from the inked margin of the specimen. The remainder of the biopsy shows features of gynecomastia. Called to Dr. Tecora Eustache's office on 09/28/2013. (JDP:kh 09/28/13)  Assessment    Stage I left breast cancer    Plan    For  Re excision and left axillary sentinel lymph node mapping. Sentinel lymph node mapping and dissection has been discussed with the patient.  Risk of bleeding,  Infection,  Seroma formation,  Additional procedures,,  Shoulder weakness ,  Shoulder stiffness,  Nerve and blood vessel injury and reaction to the mapping dyes have been discussed.  Alternatives to surgery have been discussed with the patient.  The patient agrees to proceed.       Ahava Kissoon A. 10/13/2013, 11:35 AM    

## 2013-10-14 ENCOUNTER — Ambulatory Visit (HOSPITAL_COMMUNITY)
Admission: RE | Admit: 2013-10-14 | Discharge: 2013-10-14 | Disposition: A | Discharge status: Home / Self Care | Payer: BC Managed Care – PPO | Source: Ambulatory Visit | Attending: Surgery | Admitting: Surgery

## 2013-10-14 ENCOUNTER — Ambulatory Visit (HOSPITAL_BASED_OUTPATIENT_CLINIC_OR_DEPARTMENT_OTHER)
Admission: RE | Admit: 2013-10-14 | Discharge: 2013-10-14 | Disposition: A | Discharge status: Home / Self Care | Payer: BC Managed Care – PPO | Source: Ambulatory Visit | Attending: Surgery | Admitting: Surgery

## 2013-10-14 ENCOUNTER — Ambulatory Visit (HOSPITAL_BASED_OUTPATIENT_CLINIC_OR_DEPARTMENT_OTHER): Payer: BC Managed Care – PPO | Admitting: Anesthesiology

## 2013-10-14 ENCOUNTER — Encounter (HOSPITAL_BASED_OUTPATIENT_CLINIC_OR_DEPARTMENT_OTHER)
Admission: RE | Disposition: A | Discharge status: Home / Self Care | Payer: Self-pay | Source: Ambulatory Visit | Attending: Surgery

## 2013-10-14 ENCOUNTER — Encounter (HOSPITAL_BASED_OUTPATIENT_CLINIC_OR_DEPARTMENT_OTHER): Payer: BC Managed Care – PPO | Admitting: Anesthesiology

## 2013-10-14 ENCOUNTER — Encounter (HOSPITAL_BASED_OUTPATIENT_CLINIC_OR_DEPARTMENT_OTHER): Payer: Self-pay

## 2013-10-14 DIAGNOSIS — F3289 Other specified depressive episodes: Secondary | ICD-10-CM | POA: Insufficient documentation

## 2013-10-14 DIAGNOSIS — Z79899 Other long term (current) drug therapy: Secondary | ICD-10-CM | POA: Insufficient documentation

## 2013-10-14 DIAGNOSIS — E538 Deficiency of other specified B group vitamins: Secondary | ICD-10-CM | POA: Diagnosis not present

## 2013-10-14 DIAGNOSIS — N62 Hypertrophy of breast: Secondary | ICD-10-CM

## 2013-10-14 DIAGNOSIS — C50922 Malignant neoplasm of unspecified site of left male breast: Secondary | ICD-10-CM

## 2013-10-14 DIAGNOSIS — Z87891 Personal history of nicotine dependence: Secondary | ICD-10-CM | POA: Insufficient documentation

## 2013-10-14 DIAGNOSIS — C50929 Malignant neoplasm of unspecified site of unspecified male breast: Secondary | ICD-10-CM | POA: Insufficient documentation

## 2013-10-14 DIAGNOSIS — Z859 Personal history of malignant neoplasm, unspecified: Secondary | ICD-10-CM | POA: Diagnosis not present

## 2013-10-14 DIAGNOSIS — F329 Major depressive disorder, single episode, unspecified: Secondary | ICD-10-CM | POA: Insufficient documentation

## 2013-10-14 DIAGNOSIS — Z85528 Personal history of other malignant neoplasm of kidney: Secondary | ICD-10-CM | POA: Insufficient documentation

## 2013-10-14 HISTORY — PX: RE-EXCISION OF BREAST LUMPECTOMY: SHX6048

## 2013-10-14 HISTORY — PX: AXILLARY SENTINEL NODE BIOPSY: SHX5738

## 2013-10-14 SURGERY — EXCISION, LESION, BREAST
Anesthesia: General | Site: Breast | Laterality: Left

## 2013-10-14 MED ORDER — MIDAZOLAM HCL 2 MG/2ML IJ SOLN
1.0000 mg | INTRAMUSCULAR | Status: DC | PRN
  Administered 2013-10-14: 2 mg via INTRAVENOUS

## 2013-10-14 MED ORDER — FENTANYL CITRATE 0.05 MG/ML IJ SOLN
INTRAMUSCULAR | Status: AC
  Filled 2013-10-14: qty 6

## 2013-10-14 MED ORDER — FENTANYL CITRATE 0.05 MG/ML IJ SOLN
INTRAMUSCULAR | Status: DC | PRN
  Administered 2013-10-14 (×2): 50 ug via INTRAVENOUS

## 2013-10-14 MED ORDER — CEFAZOLIN SODIUM 1-5 GM-% IV SOLN
INTRAVENOUS | Status: AC
  Filled 2013-10-14: qty 150

## 2013-10-14 MED ORDER — BUPIVACAINE-EPINEPHRINE PF 0.25-1:200000 % IJ SOLN
INTRAMUSCULAR | Status: AC
Start: 2013-10-14 — End: 2013-10-14
  Filled 2013-10-14: qty 30

## 2013-10-14 MED ORDER — MIDAZOLAM HCL 2 MG/2ML IJ SOLN
INTRAMUSCULAR | Status: AC
  Filled 2013-10-14: qty 2

## 2013-10-14 MED ORDER — OXYCODONE HCL 5 MG PO TABS: 5.0000 mg | ORAL_TABLET | Freq: Once | ORAL | Status: DC | PRN

## 2013-10-14 MED ORDER — BUPIVACAINE-EPINEPHRINE 0.25% -1:200000 IJ SOLN
INTRAMUSCULAR | Status: DC | PRN
  Administered 2013-10-14: 10 mL

## 2013-10-14 MED ORDER — DEXAMETHASONE SODIUM PHOSPHATE 4 MG/ML IJ SOLN
INTRAMUSCULAR | Status: DC | PRN
  Administered 2013-10-14: 10 mg via INTRAVENOUS

## 2013-10-14 MED ORDER — LIDOCAINE HCL (CARDIAC) 20 MG/ML IV SOLN
INTRAVENOUS | Status: DC | PRN
  Administered 2013-10-14: 50 mg via INTRAVENOUS

## 2013-10-14 MED ORDER — HYDROMORPHONE HCL PF 1 MG/ML IJ SOLN: 0.2500 mg | INTRAMUSCULAR | Status: DC | PRN

## 2013-10-14 MED ORDER — OXYCODONE HCL 5 MG/5ML PO SOLN: 5.0000 mg | Freq: Once | ORAL | Status: DC | PRN

## 2013-10-14 MED ORDER — LACTATED RINGERS IV SOLN
INTRAVENOUS | Status: DC
  Administered 2013-10-14 (×2): via INTRAVENOUS

## 2013-10-14 MED ORDER — CEFAZOLIN SODIUM 1-5 GM-% IV SOLN
INTRAVENOUS | Status: AC
  Filled 2013-10-14: qty 100

## 2013-10-14 MED ORDER — TECHNETIUM TC 99M SULFUR COLLOID FILTERED
1.0000 | Freq: Once | INTRAVENOUS | Status: AC | PRN
  Administered 2013-10-14: 1 via INTRADERMAL

## 2013-10-14 MED ORDER — PROPOFOL 10 MG/ML IV BOLUS
INTRAVENOUS | Status: DC | PRN
  Administered 2013-10-14: 150 mg via INTRAVENOUS

## 2013-10-14 MED ORDER — FENTANYL CITRATE 0.05 MG/ML IJ SOLN
50.0000 ug | INTRAMUSCULAR | Status: DC | PRN
  Administered 2013-10-14: 100 ug via INTRAVENOUS

## 2013-10-14 MED ORDER — OXYCODONE-ACETAMINOPHEN 5-325 MG PO TABS: 1.0000 | ORAL_TABLET | ORAL | Status: DC | PRN

## 2013-10-14 MED ORDER — FENTANYL CITRATE 0.05 MG/ML IJ SOLN
INTRAMUSCULAR | Status: AC
  Filled 2013-10-14: qty 2

## 2013-10-14 MED ORDER — 0.9 % SODIUM CHLORIDE (POUR BTL) OPTIME
TOPICAL | Status: DC | PRN
  Administered 2013-10-14: 500 mL

## 2013-10-14 MED ORDER — CHLORHEXIDINE GLUCONATE 4 % EX LIQD: 1.0000 "application " | Freq: Once | CUTANEOUS | Status: DC

## 2013-10-14 MED ORDER — HEMOSTATIC AGENTS (NO CHARGE) OPTIME
TOPICAL | Status: DC | PRN
  Administered 2013-10-14: 1 via TOPICAL

## 2013-10-14 MED ORDER — ONDANSETRON HCL 4 MG/2ML IJ SOLN
INTRAMUSCULAR | Status: DC | PRN
  Administered 2013-10-14: 4 mg via INTRAVENOUS

## 2013-10-14 MED ORDER — DEXTROSE 5 % IV SOLN
3.0000 g | INTRAVENOUS | Status: AC
  Administered 2013-10-14: 2 g via INTRAVENOUS

## 2013-10-14 SURGICAL SUPPLY — 67 items
ADH SKN CLS APL DERMABOND .7 (GAUZE/BANDAGES/DRESSINGS) ×2
APPLIER CLIP 9.375 MED OPEN (MISCELLANEOUS) ×3
APR CLP MED 9.3 20 MLT OPN (MISCELLANEOUS) ×2
BINDER BREAST LRG (GAUZE/BANDAGES/DRESSINGS) IMPLANT
BINDER BREAST MEDIUM (GAUZE/BANDAGES/DRESSINGS) IMPLANT
BINDER BREAST XLRG (GAUZE/BANDAGES/DRESSINGS) IMPLANT
BINDER BREAST XXLRG (GAUZE/BANDAGES/DRESSINGS) IMPLANT
BLADE SURG 10 STRL SS (BLADE) IMPLANT
BLADE SURG 15 STRL LF DISP TIS (BLADE) ×2 IMPLANT
BLADE SURG 15 STRL SS (BLADE) ×3
BLADE SURG ROTATE 9660 (MISCELLANEOUS) ×1 IMPLANT
CANISTER SUCT 1200ML W/VALVE (MISCELLANEOUS) ×3 IMPLANT
CHLORAPREP W/TINT 26ML (MISCELLANEOUS) ×3 IMPLANT
CLIP APPLIE 9.375 MED OPEN (MISCELLANEOUS) IMPLANT
CLIP TI WIDE RED SMALL 6 (CLIP) IMPLANT
COVER MAYO STAND STRL (DRAPES) ×3 IMPLANT
COVER PROBE W GEL 5X96 (DRAPES) ×3 IMPLANT
COVER TABLE BACK 60X90 (DRAPES) ×3 IMPLANT
DECANTER SPIKE VIAL GLASS SM (MISCELLANEOUS) ×2 IMPLANT
DERMABOND ADVANCED (GAUZE/BANDAGES/DRESSINGS) ×1
DERMABOND ADVANCED .7 DNX12 (GAUZE/BANDAGES/DRESSINGS) ×2 IMPLANT
DEVICE DUBIN W/COMP PLATE 8390 (MISCELLANEOUS) IMPLANT
DRAIN CHANNEL 19F RND (DRAIN) IMPLANT
DRAIN HEMOVAC 1/8 X 5 (WOUND CARE) IMPLANT
DRAPE LAPAROSCOPIC ABDOMINAL (DRAPES) ×3 IMPLANT
DRAPE PED LAPAROTOMY (DRAPES) ×2 IMPLANT
DRAPE UTILITY XL STRL (DRAPES) ×3 IMPLANT
ELECT COATED BLADE 2.86 ST (ELECTRODE) ×3 IMPLANT
ELECT REM PT RETURN 9FT ADLT (ELECTROSURGICAL) ×3
ELECTRODE REM PT RTRN 9FT ADLT (ELECTROSURGICAL) ×2 IMPLANT
EVACUATOR SILICONE 100CC (DRAIN) IMPLANT
GLOVE BIOGEL PI IND STRL 7.0 (GLOVE) IMPLANT
GLOVE BIOGEL PI IND STRL 8 (GLOVE) ×2 IMPLANT
GLOVE BIOGEL PI INDICATOR 7.0 (GLOVE) ×2
GLOVE BIOGEL PI INDICATOR 8 (GLOVE) ×1
GLOVE ECLIPSE 6.5 STRL STRAW (GLOVE) ×2 IMPLANT
GLOVE ECLIPSE 8.0 STRL XLNG CF (GLOVE) ×4 IMPLANT
GLOVE EXAM NITRILE LRG STRL (GLOVE) ×1 IMPLANT
GOWN PREVENTION PLUS XLARGE (GOWN DISPOSABLE) ×7 IMPLANT
HEMOSTAT SURGICEL 2X14 (HEMOSTASIS) ×2 IMPLANT
KIT MARKER MARGIN INK (KITS) ×1 IMPLANT
NDL HYPO 25X1 1.5 SAFETY (NEEDLE) ×4 IMPLANT
NDL SAFETY ECLIPSE 18X1.5 (NEEDLE) ×2 IMPLANT
NEEDLE HYPO 18GX1.5 SHARP (NEEDLE)
NEEDLE HYPO 25X1 1.5 SAFETY (NEEDLE) ×3 IMPLANT
NS IRRIG 1000ML POUR BTL (IV SOLUTION) ×3 IMPLANT
PACK BASIN DAY SURGERY FS (CUSTOM PROCEDURE TRAY) ×3 IMPLANT
PENCIL BUTTON HOLSTER BLD 10FT (ELECTRODE) ×3 IMPLANT
PIN SAFETY STERILE (MISCELLANEOUS) IMPLANT
SLEEVE SCD COMPRESS KNEE MED (MISCELLANEOUS) ×3 IMPLANT
SPONGE GAUZE 4X4 12PLY STER LF (GAUZE/BANDAGES/DRESSINGS) IMPLANT
SPONGE LAP 18X18 X RAY DECT (DISPOSABLE) IMPLANT
SPONGE LAP 4X18 X RAY DECT (DISPOSABLE) ×3 IMPLANT
STAPLER VISISTAT 35W (STAPLE) IMPLANT
SUT ETHILON 3 0 PS 1 (SUTURE) IMPLANT
SUT MNCRL AB 3-0 PS2 18 (SUTURE) ×5 IMPLANT
SUT MON AB 4-0 PC3 18 (SUTURE) ×3 IMPLANT
SUT SILK 2 0 SH (SUTURE) IMPLANT
SUT VIC AB 3-0 SH 27 (SUTURE)
SUT VIC AB 3-0 SH 27X BRD (SUTURE) ×2 IMPLANT
SUT VICRYL 3-0 CR8 SH (SUTURE) ×3 IMPLANT
SYR BULB 3OZ (MISCELLANEOUS) ×3 IMPLANT
SYR CONTROL 10ML LL (SYRINGE) ×5 IMPLANT
TOWEL OR 17X24 6PK STRL BLUE (TOWEL DISPOSABLE) ×5 IMPLANT
TOWEL OR NON WOVEN STRL DISP B (DISPOSABLE) ×3 IMPLANT
TUBE CONNECTING 20X1/4 (TUBING) ×3 IMPLANT
YANKAUER SUCT BULB TIP NO VENT (SUCTIONS) ×3 IMPLANT

## 2013-10-14 NOTE — Interval H&P Note (Signed)
History and Physical Interval Note:  10/14/2013 1:03 PM  Henry [name]  has presented today for surgery, with the diagnosis of left breast cancer   The various methods of treatment have been discussed with the patient and family. After consideration of risks, benefits and other options for treatment, the patient has consented to  Procedure(s): RE-EXCISION OF BREAST LUMPECTOMY (Left) sentinel lymph node mapping  (Left) as a surgical intervention .  The patient's history has been reviewed, patient examined, no change in status, stable for surgery.  I have reviewed the patient's chart and labs.  Questions were answered to the patient's satisfaction.     [name]

## 2013-10-14 NOTE — Anesthesia Preprocedure Evaluation (Addendum)
Anesthesia Evaluation  Patient identified by MRN, date of birth, ID band Patient awake    Reviewed: Allergy & Precautions, H&P , NPO status , Patient's Chart, lab work & pertinent test results  Airway Mallampati: II TM Distance: >3 FB Neck ROM: Full    Dental no notable dental hx. (+) Teeth Intact and Dental Advisory Given   Pulmonary neg pulmonary ROS, former smoker,  breath sounds clear to auscultation  Pulmonary exam normal       Cardiovascular negative cardio ROS  Rhythm:Regular Rate:Normal     Neuro/Psych Depression negative neurological ROS     GI/Hepatic negative GI ROS, Neg liver ROS,   Endo/Other  negative endocrine ROS  Renal/GU negative Renal ROS  negative genitourinary   Musculoskeletal   Abdominal   Peds  Hematology negative hematology ROS (+)   Anesthesia Other Findings   Reproductive/Obstetrics negative OB ROS                          Anesthesia Physical Anesthesia Plan  ASA: II  Anesthesia Plan: General   Post-op Pain Management:    Induction: Intravenous  Airway Management Planned: LMA  Additional Equipment:   Intra-op Plan:   Post-operative Plan: Extubation in OR  Informed Consent: I have reviewed the patients History and Physical, chart, labs and discussed the procedure including the risks, benefits and alternatives for the proposed anesthesia with the patient or authorized representative who has indicated his/her understanding and acceptance.   Dental advisory given  Plan Discussed with: CRNA  Anesthesia Plan Comments:         Anesthesia Quick Evaluation

## 2013-10-14 NOTE — Anesthesia Postprocedure Evaluation (Signed)
  Anesthesia Post-op Note  Patient: Henry [name] Apt  Procedure(s) Performed: Procedure(s): RE-EXCISION OF BREAST LUMPECTOMY (Left) sentinel lymph node mapping  (Left)  Patient Location: PACU  Anesthesia Type:General  Level of Consciousness: awake and alert   Airway and Oxygen Therapy: Patient Spontanous Breathing  Post-op Pain: mild  Post-op Assessment: Post-op Vital signs reviewed, Patient's Cardiovascular Status Stable and Respiratory Function Stable  Post-op Vital Signs: Reviewed  Filed Vitals:   10/14/13 1500  BP: 137/93  Pulse: 89  Temp:   Resp: 11    Complications: No apparent anesthesia complications

## 2013-10-14 NOTE — Op Note (Signed)
Preoperative diagnosis: Stage I left breast cancer  Postoperative diagnosis: Same  Procedure: Left breast reexcision lumpectomy and left axillary sentinel lymph node mapping using tracer   Surgeon: Harriette Bouillon M.D.  Anesthesia: LMA with 0.25% Sensorcaine local with with epinephrine  EBL: Minimal  Drains: None  Indications for procedure: The patient is a pleasant 62 year old male who underwent left breast lumpectomy 2 weeks ago which showed a 1 cm invasive ductal carcinoma with DCIS ER/PR positive HER-2/neu negative. His margins were close and he required a sentinel node since this was done as an excisional biopsy. He is a history of GI stromal tumor and renal cell carcinoma.The procedure has been discussed with the patient. Alternatives to surgery have been discussed with the patient.  Risks of surgery include bleeding,  Infection,  Seroma formation, death,  and the need for further surgery.   The patient understands and wishes to proceed.Sentinel lymph node mapping and dissection has been discussed with the patient.  Risk of bleeding,  Infection,  Seroma formation,  Additional procedures,,  Shoulder weakness ,  Shoulder stiffness,  Nerve and blood vessel injury and reaction to the mapping dyes have been discussed.  Alternatives to surgery have been discussed with the patient.  The patient agrees to proceed.  Description of procedure: The patient met in the holding area. Patient left breast injection by nuclear medicine. Questions were answered prior this. Left-sided marks correct. Patient taken back to the operating room and placed upon the OR table. Left breast left axilla were prepped and draped in a sterile fashion after induction of general anesthesia. Timeout was done. The previous breast incision was opened. Margins were reexcised and labeled with the patient kit. Cavity is irrigated and closed with 3-0 Vicryl 4-0 Monocryl.  Sentinel node was then done. Incision made a left axilla with  the help of the neoprobe. 3 left accessory sentinel nodes identified which are level II and excised. These were hot nodes. Long thoracic nerve, axillary vein and thoracodorsal trunk preserved. Hemostasis achieved with cautery and Surgicel snow. Wound closed with 3 or Vicryl 4 Monocryl.  Dermabond applied to both incisions. All final counts found to be correct. Patient awoke extubated taken recovery in satisfactory condition.

## 2013-10-14 NOTE — Anesthesia Procedure Notes (Signed)
Procedure Name: LMA Insertion Date/Time: 10/14/2013 1:31 PM Performed by: Caren Macadam Pre-anesthesia Checklist: Patient identified, Emergency Drugs available, Suction available and Patient being monitored Patient Re-evaluated:Patient Re-evaluated prior to inductionOxygen Delivery Method: Circle System Utilized Preoxygenation: Pre-oxygenation with 100% oxygen Intubation Type: IV induction Ventilation: Mask ventilation without difficulty LMA: LMA inserted LMA Size: 4.0 Number of attempts: 1 Airway Equipment and Method: bite block Placement Confirmation: positive ETCO2 and breath sounds checked- equal and bilateral Tube secured with: Tape Dental Injury: Teeth and Oropharynx as per pre-operative assessment

## 2013-10-14 NOTE — H&P (View-Only) (Signed)
Patient ID: MAVRICK MCQUIGG, male   DOB: Jan 22, 1951, 62 y.o.   MRN: [id number]  Chief Complaint  Patient presents with  . Follow-up    HPI GEOFFRY BANNISTER is a 62 y.o. male.  Patient returns after left breast lumpectomy. 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  Patient here today to discuss reexcision and sentinel lymph node mapping. HPI  Past Medical History  Diagnosis Date  . 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 Pneumothorax, spontaneous, tension 1992    TREATED WITH A CHEST TUBE  . Cancer NOVEMBER 2008    T1a PAPILLARY RENAL CELL CARCINOMA  . B12 nutritional deficiency     RELATED TO PREVIOUS SURGRIES/ ON MONTHLY SUPPLEMENT  . Depression NOTED IN NOTE 05-28-11    HISTORY OF MANIC  DEPRESSIVE TYPE DISORDER  . Anemia 2008    RESOLVED  WITH IV IRON DEXTRAN AS HE WAS UNABLE  TO ABSORB  ORAL IRON.      Past Surgical History  Procedure Laterality Date  . Bilroth ii procedure  12-31-2005  . Cholecystectomy  12-31-2005    DONE WITH GASTRECTOMY BY DR. Maisie Fus Andreana Klingerman  . Partial nephrectomy  NOVEMBER 2008    RIGHT SIDE/ FOR T1a PAPILLARY RENAL CELL CARCINOMA  . Tumor removal  MARCH 2011    FOR RECURRENT GIST BY DR. Lenis Noon AT [facility]    Family History  Problem Relation Age of Onset  . Birth defects Mother     lung, ovarian, breast    Social History History  Substance Use Topics  . Smoking status: Former Smoker -- 1.50 packs/day for 25 years    Quit date: 07/03/1989  . Smokeless tobacco: Not on file  . Alcohol Use: No    Allergies  Allergen Reactions  . Feraheme [Ferumoxytol]     Current Outpatient Prescriptions  Medication Sig Dispense Refill  . b complex vitamins capsule Take 1 capsule by mouth daily.      . Cyanocobalamin (VITAMIN B-12 IJ) Inject 1,000 mcg as directed every 30 (thirty) days. RECEIVES INJECTION AT CANCER CENTER       . imatinib (GLEEVEC) 100 MG tablet Take 1 tablet  (100 mg total) by mouth daily. Take with meals and large glass of water.Caution:Chemotherapy  30 tablet  6   No current facility-administered medications for this visit.    Review of Systems Review of Systems  Constitutional: Negative.     Blood pressure 122/71, pulse 62, temperature 99.6 F (37.6 C), temperature source Temporal, resp. rate 16, height 6' (1.829 m), weight 131 lb 3.2 oz (59.512 kg).  Physical Exam Physical Exam  Constitutional: He is oriented to person, place, and time. He appears well-developed and well-nourished.  Pulmonary/Chest:    Neurological: He is alert and oriented to person, place, and time.  Skin: Skin is warm.  Psychiatric: He has a normal mood and affect. His behavior is normal. Judgment and thought content normal.    Data Reviewed Breast, biopsy, mass, left - INVASIVE DUCTAL CARCINOMA, 1 CM. - DUCTAL CARCINOMA IN SITU. - FINDINGS CONSISTENT WITH GYNECOMASTIA. Microscopic Comment BREAST, INVASIVE TUMOR, WITHOUT LYMPH NODE SAMPLING Specimen, including laterality and lymph node sampling (sentinel, non-sentinel): Left breast without sentinel lymph nodes Procedure: Excisional biopsy Histologic type: Ductal Grade: II Tubule formation: 2 Nuclear pleomorphism: 2 Mitotic: 2 Tumor size (gross measurement): 1 cm Margins: Invasive, distance to closest margin: Less than 0.1 cm. In-situ, distance to closest margin: Less than  0.1 cm If margin positive, focally or broadly: N/A Lymphovascular invasion: Not identified. Ductal carcinoma in situ: Grade: Intermediate Extensive intraductal component: No Lobular neoplasia: No Tumor focality: Unifocal Treatment effect: No If present, treatment effect in breast tissue, lymph nodes or both: N/A Extent of tumor: Skin: N/A Nipple: N/A Skeletal muscle: N/A Breast prognostic profile: Pending Estrogen receptor: Pending Progesterone receptor: Pending Her 2 neu: Pending Ki-67: Pending Non-neoplastic breast:  Gynecomastia TNM: pT1b, pNX, pMX 2 of 3 FINAL for Stengel, Joneric C ([id number]) Microscopic Comment(continued) Comments: The tumor is well circumscribed, 1 cm in greatest dimension and shows lack of basal cell staining with p63, smooth muscle myosin and calponin consistent with invasive ductal carcinoma. The tumor is strongly estrogen receptor positive and positive with gross cystic disease fluid protein. The invasive and in situ carcinoma is focally less than 0.1 cm from the inked margin of the specimen. The remainder of the biopsy shows features of gynecomastia. Called to Dr. Rosezena Sensor office on 09/28/2013. (JDP:kh 09/28/13)  Assessment    Stage I left breast cancer    Plan    For  Re excision and left axillary sentinel lymph node mapping. Sentinel lymph node mapping and dissection has been discussed with the patient.  Risk of bleeding,  Infection,  Seroma formation,  Additional procedures,,  Shoulder weakness ,  Shoulder stiffness,  Nerve and blood vessel injury and reaction to the mapping dyes have been discussed.  Alternatives to surgery have been discussed with the patient.  The patient agrees to proceed.       Elinor Kleine A. 10/13/2013, 11:35 AM

## 2013-10-14 NOTE — Transfer of Care (Signed)
Immediate Anesthesia Transfer of Care Note  Patient: Henry [name]  Procedure(s) Performed: Procedure(s): RE-EXCISION OF BREAST LUMPECTOMY (Left) sentinel lymph node mapping  (Left)  Patient Location: PACU  Anesthesia Type:General  Level of Consciousness: awake and alert   Airway & Oxygen Therapy: Patient Spontanous Breathing and Patient connected to face mask oxygen  Post-op Assessment: Report given to PACU RN and Post -op Vital signs reviewed and stable  Post vital signs: Reviewed and stable  Complications: No apparent anesthesia complications

## 2013-10-18 ENCOUNTER — Other Ambulatory Visit: Payer: Self-pay | Admitting: Oncology

## 2013-10-19 ENCOUNTER — Telehealth (INDEPENDENT_AMBULATORY_CARE_PROVIDER_SITE_OTHER): Payer: Self-pay

## 2013-10-19 ENCOUNTER — Encounter: Payer: Self-pay | Admitting: Oncology

## 2013-10-19 ENCOUNTER — Encounter (HOSPITAL_BASED_OUTPATIENT_CLINIC_OR_DEPARTMENT_OTHER): Payer: Self-pay | Admitting: Surgery

## 2013-10-19 NOTE — Progress Notes (Signed)
Laurian Brim called to advise that the patient doesn't have insurance. So I called to advise to bring in income info to assist with meds. The patient became really irrate with a lot of bad language. He is angry with [employer] because he has paid them and he should have coverage. He hung on me., I called him back to advise we could help him meds and to advise what we needed and he didn't answer the phone.  Not sure how we got he doesn't have coverage, but he said he does and had paid [employer] a lot of money and he better have it and we should have it with his acct# with 01 on the end. I did advise him that is what we have in the system.

## 2013-10-19 NOTE — Telephone Encounter (Signed)
Message copied by Brennan Bailey on Mon Oct 19, 2013  1:20 PM ------      Message from: Harriette Bouillon A      Created: Mon Oct 19, 2013 12:19 PM       Looks great node negative needs to call oncologist for follow up ------

## 2013-10-19 NOTE — Telephone Encounter (Signed)
LMOM> path shows negative nodes. Margins now clear.

## 2013-10-19 NOTE — Telephone Encounter (Signed)
Pt returned call and was given path report.

## 2013-10-20 ENCOUNTER — Encounter: Payer: Self-pay | Admitting: Oncology

## 2013-10-20 ENCOUNTER — Ambulatory Visit (HOSPITAL_BASED_OUTPATIENT_CLINIC_OR_DEPARTMENT_OTHER): Payer: BC Managed Care – PPO | Admitting: Oncology

## 2013-10-20 VITALS — BP 151/80 | HR 96 | Temp 97.9°F | Resp 18 | Ht 72.0 in | Wt 134.9 lb

## 2013-10-20 DIAGNOSIS — E538 Deficiency of other specified B group vitamins: Secondary | ICD-10-CM

## 2013-10-20 DIAGNOSIS — C50422 Malignant neoplasm of upper-outer quadrant of left male breast: Secondary | ICD-10-CM | POA: Insufficient documentation

## 2013-10-20 DIAGNOSIS — C50929 Malignant neoplasm of unspecified site of unspecified male breast: Secondary | ICD-10-CM

## 2013-10-20 DIAGNOSIS — C494 Malignant neoplasm of connective and soft tissue of abdomen: Secondary | ICD-10-CM

## 2013-10-20 DIAGNOSIS — D508 Other iron deficiency anemias: Secondary | ICD-10-CM

## 2013-10-20 MED ORDER — CYANOCOBALAMIN 1000 MCG/ML IJ SOLN
1000.0000 ug | Freq: Once | INTRAMUSCULAR | Status: AC
  Administered 2013-10-20: 1000 ug via INTRAMUSCULAR

## 2013-10-20 MED ORDER — CYANOCOBALAMIN 1000 MCG/ML IJ SOLN
INTRAMUSCULAR | Status: AC
  Filled 2013-10-20: qty 1

## 2013-10-20 MED ORDER — TAMOXIFEN CITRATE 10 MG PO TABS: 10.0000 mg | ORAL_TABLET | Freq: Two times a day (BID) | ORAL | Status: DC

## 2013-10-20 NOTE — Progress Notes (Signed)
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  Henry [name] had been aware of palpable mass adjacent to left areola for possibly 2-3 months, which had not changed that he could tell during that time. He had bilateral mammograms and left US done at [facility] on 08-28-13, then excisional biopsy by Henry [name] on 09-23-13. 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 He had re-excision with evaluation of sentinel nodes on 10-14-13 ([id number]), with no residual malignancy in the specimen and 6 sentinel nodes negative. He has had no obvious post operative complications, tho still slight numbness in left axilla.  ONCOLOGIC HISTORY History is of GIST initially disgnosed 2007, with subtotal gastrectomy with Roux-en-Y, then briefly on study with Gleevec which he did not tolerate due to marked elevations in LFTs. He had recurrent GIST in late [name] with further surgery by Henry [name] Henry [name], then rapid elevation of LFTs with 50% dosing (200 mg/d) gleevec, with gleevec again discontinued after short period in [name]. He had further progression in June [name], did tolerate gleevec at 100mg /d with initial prednisone, with adequate shrinkage of tumor to allow surgery by Henry [name]. At laparotomy he had extensive adhesions but no gross peritoneal, hepatic or bowel involvement. The mass itself was cystic and soft and not densely adherent to any visceral structures. 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 No mitotic index is reported. He saw Henry [name] last in Dec [name]. Last CT CAP was in Henry [name] system 08-28-13; last PET Jan [name].  Henry [name] has needed B12 injections due to gastric/ bowel resection. He was iron deficient related to surgeries and blood draws, previously had marked skin reaction to feraheme, but did tolerate inferon on 12-10-2011 with no difficulty.  He also has history of T1a renal cell carcinoma, treated with partial right nephrectomy in 2008 and not active since then.  Review of systems as above, also: No fever or symptoms of infection. No swelling LUE. Good energy, appetite at baseline, no abdominal discomfort. No new or different pain otherwise. No bleeding. No swelling LE. Remainder of 10 point Review of Systems negative.   Family History Mother with gyn and other cancers, died 53 2 sisters, 2 brothers without known malignancy Maternal grandmother lived to age 59 and all siblings lived to old age Maternal grandfather died 84 with COPD  Objective:  Vital signs in last 24 hours:  BP 151/80  Pulse 96  Temp(Src) 97.9 F (36.6 C) (Oral)  Resp 18  Ht 6' (1.829 m)  Wt 134 lb 14.4 oz (61.19 kg)  BMI 18.29 kg/m2 Weight is up 2.5 lbs. Alert, oriented, appropriate. Talkative, not pleased with interactions around mammograms, which we have discussed. Quickly and easily ambulatory. NAD.   HEENT:PERRL, sclerae not icteric. Oral mucosa moist without lesions, posterior pharynx clear.  Neck supple. No JVD.  Lymphatics:no cervical,suraclavicular, axillary adenopathy Resp: clear to auscultation bilaterally and normal percussion bilaterally Cardio: regular rate and rhythm. No gallop.  GI: soft, nontender, not distended, no mass or organomegaly. Normally active bowel sounds. Surgical incision not remarkable. Musculoskeletal/ Extremities: without  pitting edema, cords, tenderness Neuro: nonfocal Skin without rash, ecchymosis, petechiae Breasts: left surgical incision clean and dry, no dressing, not tender, no appreciable residual mass. Left axilla also appears to be healing well, no erythema or unusual tenderness, no swelling LUE. Right breast without palpable mass or nipple/ skin/right axilla findings.  Lab Results:  Results for orders placed during the [name] encounter of 10/14/13  POCT HEMOGLOBIN-HEMACUE      Result Value Range   Hemoglobin 13.9  13.0 - 17.0 g/dL    CBC 16-1-09 noted. Last CMET 08-12-13   Studies/Results:  PATHOLOGY [name], [name] ([id number]) Patient: Henry [name] Collected: 09/23/2013 Client: [facility] Accession: [id number] Received: 09/23/2013 Henry Bouillon, MD REPORT OF SURGICAL PATHOLOGY ADDITIONAL INFORMATION: CHROMOGENIC IN-SITU HYBRIDIZATION Results: HER-2/NEU BY CISH - NO AMPLIFICATION OF HER-2 DETECTED. RESULT RATIO OF HER2: CEP 17 SIGNALS 1.31 AVERAGE HER2 COPY NUMBER PER CELL 1.90 REFERENCE RANGE NEGATIVE HER2/Chr17 Ratio <2.0 and Average HER2 copy number <4.0 EQUIVOCAL HER2/Chr17 Ratio <2.0 and Average HER2 copy number 4.0 and <6.0 POSITIVE HER2/Chr17 Ratio >=2.0 and/or Average HER2 copy number >=6.0 Henry Leisure MD Pathologist, Electronic Signature ( Signed 10/02/2013) PROGNOSTIC INDICATORS - ACIS Results: IMMUNOHISTOCHEMICAL AND MORPHOMETRIC ANALYSIS BY THE AUTOMATED CELLULAR IMAGING SYSTEM (ACIS) Estrogen Receptor: 99%, POSITIVE, STRONG STAINING INTENSITY Progesterone Receptor: 97%, POSITIVE, STRONG STAINING INTENSITY Proliferation Marker Ki67: 50% REFERENCE RANGE ESTROGEN RECEPTOR NEGATIVE <1% POSITIVE =>1% PROGESTERONE RECEPTOR NEGATIVE <1% POSITIVE =>1% All controls stained appropriately Henry Leisure MD Pathologist, Electronic Signature ( Signed 09/30/2013) FINAL DIAGNOSIS Diagnosis Breast, biopsy, mass, left - INVASIVE DUCTAL  CARCINOMA, 1 CM. - DUCTAL CARCINOMA IN SITU. - FINDINGS CONSISTENT WITH GYNECOMASTIA. Microscopic Comment BREAST, INVASIVE TUMOR, WITHOUT LYMPH NODE SAMPLING Specimen, including laterality and lymph node sampling (sentinel, non-sentinel): Left breast without sentinel lymph nodes Procedure: Excisional biopsy Histologic type: Ductal Grade: II Tubule formation: 2 Nuclear pleomorphism: 2 Mitotic: 2 Tumor size (gross measurement): 1 cm Margins: Invasive, distance to closest margin: Less than 0.1 cm. In-situ, distance to closest margin: Less than 0.1 cm If margin positive, focally or broadly: N/A Lymphovascular invasion: Not identified. Ductal carcinoma in situ: Grade: Intermediate Extensive intraductal component: No Lobular neoplasia: No Tumor focality: Unifocal Treatment effect: No If present, treatment effect in breast tissue, lymph nodes or both: N/A Extent of tumor: Skin: N/A Nipple: N/A Skeletal muscle: N/A Breast prognostic profile: Pending Estrogen receptor: Pending Progesterone receptor: Pending Her 2 neu: Pending Ki-67: Pending Non-neoplastic breast: Gynecomastia TNM: pT1b, pNX, pMX  Comments: The tumor is well circumscribed, 1 cm in greatest dimension and shows lack of basal cell staining with p63, smooth muscle myosin and calponin consistent with invasive ductal carcinoma. The tumor is strongly estrogen receptor positive and positive with gross cystic disease fluid protein. The invasive and in situ carcinoma is focally less than 0.1 cm from the inked margin of the specimen. The remainder of the biopsy shows features of gynecomastia.    Patient: SEVAN, MCBROOM Collected: 10/14/2013  Accession: [id number] Received: 10/14/2013 RT OF SURGICAL PATHOLOGYL DIAGNOSIS Diagnosis 1. Breast, excision, left breast, anterior margin - BIOPSY SITE REACTION AND FINDINGS CONSISTENT WITH GYNECOMASTIA. - NO MALIGNANCY IDENTIFIED. 2. Breast, excision, Left, superior margin -  BIOPSY SITE REACTION AND FINDINGS CONSISTENT WITH GYNECOMASTIA. - NO MALIGNANCY IDENTIFIED. 3. Breast, excision, Left, inferior margin - BIOPSY SITE REACTION AND FINDINGS CONSISTENT WITH GYNECOMASTIA. - NO MALIGNANCY IDENTIFIED. 4. Breast,  excision, Left, posterior margin - BIOPSY SITE REACTION AND FINDINGS CONSISTENT WITH GYNECOMASTIA. - NO MALIGNANCY IDENTIFIED. 5. Breast, excision, Left, medial margin - BIOPSY SITE REACTION AND FINDINGS CONSISTENT WITH GYNECOMASTIA. - NO MALIGNANCY IDENTIFIED. 6. Lymph node, sentinel, biopsy, Left axillary - ONE BENIGN LYMPH NODE (0/1). 7. Lymph node, sentinel, biopsy, Left axillary - ONE BENIGN LYMPH NODE (0/1). 8. Lymph node, sentinel, biopsy, Left axillary - ONE BENIGN LYMPH NODE (0/1). 9. Lymph node, sentinel, biopsy, Left axillary - ONE BENIGN LYMPH NODE (0/1). 10. Lymph node, sentinel, biopsy, Left axillary - ONE BENIGN LYMPH NODE (0/1). 11. Lymph node, sentinel, biopsy, Left axillary - ONE BENIGN LYMPH NODE (0/1). Microscopic Comment 1. - 5. There is breast tissue with features consistent with biopsy site reaction. There are also foci consistent with gynecomastia. No residual malignancy is identified.    Medications: I have reviewed the patient's current medications. B12 will be administered today to save him coming back to office so soon. We have discussed side effects of tamoxifen including hot flashes, rare mood disturbance/ depression, rare nausea, increased risk of blood clots and precautions for long car or plane trips, possible increase in cataracts.   DISCUSSION: Prior to visit, I have discussed with pharmacist Henry. Raylene Miyamoto use of tamoxifen with present gleevec, which clearly he needs to continue; also prior to visit I have discussed case with my partner in breast oncology. Pharmacologically there is concern for CYP3A4 interaction decreasing the active tamoxifen metabolite endoxifen, however drug level testing for tamoxifen is  investigational and did not seem to correlate with clinical effectiveness in previous breast cancer study. Recommendation is to administer tamoxifen on bid schedule. I have talked with Henry [name] about the surgery done to date and pathology information; he states now that he would be willing to have mastectomy if recommended. As his case is to be presented at multidisciplinary breast conference next week, we will be interested in discussion in that regard; radiation oncology will also be considered at that conference. We have discussed rationale for use of tamoxifen given pathology information including strong hormone positive features and negative nodes; aromatase inhibitors are not approved for male breast cancer now. We have discussed the pharmacology interaction possible with gleevec, and patient and I are both comfortable with decision to use tamoxifen on bid schedule as he continues gleevec. He agrees with recommendation for genetics counseling, which we will arrange. Patient has insurance coverage for the tamoxifen at present, which I had thought was not the case as our staff tried to find assistance for this prior to visit.   Assessment/Plan: 1. 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 Will begin tamoxifen 10 mg bid. Case to be presented at multidisciplinary breast conference next week. CT CAP was done 08-28-13 (done then in follow up of GIST) without evidence of metastatic disease. Genetics counseling pending. I will try to coordinate follow up MD appointments with B12 shots as possible. 2.GIST: initially diagnosed 2007 with subtotal gastrectomy with Roux-en-Y, then briefly on study with Gleevec which he did not tolerate due to marked elevations in LFTs. Recurrent GIST with surgery by Henry Henry [name] Henry [name], then rapid elevation of LFTs with 50% dose reduction  of Gleevec. Further progression June [name], tolerated gleevec at 100 mg daily with initial steroids, further surgery Dec [name]. Continuing gleevec at 100 mg daily 3.T1a renal cell carcinoma treated with right partial nephrectomy  2008. 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 6.long past tobacco 7.flu vaccine done 8.atherosclerotic changes in aorta 9.difficult social situation: now lives ~ an [place] from [place]. 10.past diagnosis of manic depressive illness, tho I have never been clear about how that diagnosis was established.   Patient had all questions answered to the best of my ability and to his satisfaction. He is in agreement with plan above and understands that I will be in touch with him if we need to discuss further after his case is presented to other physicians.   Reece Packer, MD   10/20/2013, 9:25 PM

## 2013-10-21 ENCOUNTER — Telehealth: Payer: Self-pay | Admitting: Oncology

## 2013-10-29 ENCOUNTER — Other Ambulatory Visit: Payer: Self-pay | Admitting: *Deleted

## 2013-10-29 NOTE — Telephone Encounter (Signed)
THIS REFILL REQUEST FOR GLEEVEC WAS PLACED ON DR.LIVESAY'S DESK.

## 2013-10-30 ENCOUNTER — Telehealth: Payer: Self-pay | Admitting: Oncology

## 2013-10-30 ENCOUNTER — Encounter (INDEPENDENT_AMBULATORY_CARE_PROVIDER_SITE_OTHER): Payer: BC Managed Care – PPO | Admitting: Surgery

## 2013-10-30 ENCOUNTER — Other Ambulatory Visit: Payer: Self-pay | Admitting: Oncology

## 2013-10-30 DIAGNOSIS — C50422 Malignant neoplasm of upper-outer quadrant of left male breast: Secondary | ICD-10-CM

## 2013-10-30 NOTE — Telephone Encounter (Signed)
Medical Oncology  LM for patient that I would like to give him update from conference this week, either he can call back to my office cell or I will try to reach him again. POF to schedulers to cancel B12 injection 1-12 since this was given late Dec, and to set up appointment with me coordinating with next B12 in late Jan + lab, and to move genetics appointment sooner than March if possible.  Godfrey Pick, MD

## 2013-11-02 ENCOUNTER — Ambulatory Visit: Payer: BC Managed Care – PPO

## 2013-11-03 ENCOUNTER — Telehealth: Payer: Self-pay | Admitting: Oncology

## 2013-11-03 NOTE — Telephone Encounter (Signed)
S/w pt re appt for 1/28. Per 1/9 pof move genetics appt to earlier date if possible. No genetics availability prior to March.

## 2013-11-04 ENCOUNTER — Telehealth: Payer: Self-pay | Admitting: Oncology

## 2013-11-04 ENCOUNTER — Other Ambulatory Visit: Payer: Self-pay | Admitting: *Deleted

## 2013-11-04 DIAGNOSIS — C169 Malignant neoplasm of stomach, unspecified: Secondary | ICD-10-CM

## 2013-11-04 MED ORDER — IMATINIB MESYLATE 100 MG PO TABS: 100.0000 mg | ORAL_TABLET | Freq: Every day | ORAL | Status: DC

## 2013-11-04 NOTE — Telephone Encounter (Signed)
Medical Oncology  Reached patient by phone now. He has begun tamoxifen bid and is tolerating with no difficulty. 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  Discussed review of case at multidisciplinary Breast Conference, including surgeons, path, radiology, radiation oncology, med onc. I have told him that consensus was that margins were good, no RT recommended, path as stated, did have mammogram done of right side. Genetics information is of interest, presently earliest possible appointment is March. Depending on results of genetics testing could consider further intervention including additional surgery.  Patient aware that he needs to make regular stops to exercise legs on long car trip and also on plane flights.  He was comfortable with information and in agreement with plan. He will let us know if he needs to change the late Feb apt here or if other problems or concerns prior.  Godfrey Pick, MD

## 2013-11-12 ENCOUNTER — Other Ambulatory Visit: Payer: Self-pay | Admitting: Oncology

## 2013-11-18 ENCOUNTER — Other Ambulatory Visit: Payer: Self-pay

## 2013-11-18 ENCOUNTER — Other Ambulatory Visit (HOSPITAL_BASED_OUTPATIENT_CLINIC_OR_DEPARTMENT_OTHER): Payer: 59

## 2013-11-18 ENCOUNTER — Ambulatory Visit (HOSPITAL_BASED_OUTPATIENT_CLINIC_OR_DEPARTMENT_OTHER): Payer: 59

## 2013-11-18 ENCOUNTER — Encounter: Payer: Self-pay | Admitting: Oncology

## 2013-11-18 ENCOUNTER — Ambulatory Visit (HOSPITAL_BASED_OUTPATIENT_CLINIC_OR_DEPARTMENT_OTHER): Payer: 59 | Admitting: Oncology

## 2013-11-18 VITALS — BP 144/83 | HR 78 | Temp 97.8°F | Resp 20 | Ht 72.0 in | Wt 133.4 lb

## 2013-11-18 DIAGNOSIS — C50422 Malignant neoplasm of upper-outer quadrant of left male breast: Secondary | ICD-10-CM

## 2013-11-18 DIAGNOSIS — E538 Deficiency of other specified B group vitamins: Secondary | ICD-10-CM

## 2013-11-18 DIAGNOSIS — R351 Nocturia: Secondary | ICD-10-CM

## 2013-11-18 DIAGNOSIS — C50929 Malignant neoplasm of unspecified site of unspecified male breast: Secondary | ICD-10-CM

## 2013-11-18 DIAGNOSIS — C49A0 Gastrointestinal stromal tumor, unspecified site: Secondary | ICD-10-CM

## 2013-11-18 DIAGNOSIS — Z170 Estrogen receptor positive status [ER+]: Secondary | ICD-10-CM

## 2013-11-18 DIAGNOSIS — C494 Malignant neoplasm of connective and soft tissue of abdomen: Secondary | ICD-10-CM

## 2013-11-18 LAB — COMPREHENSIVE METABOLIC PANEL (CC13)
ALK PHOS: 32 U/L — AB (ref 40–150)
ALT: 14 U/L (ref 0–55)
AST: 20 U/L (ref 5–34)
Albumin: 3.7 g/dL (ref 3.5–5.0)
Anion Gap: 13 mEq/L — ABNORMAL HIGH (ref 3–11)
BILIRUBIN TOTAL: 1.23 mg/dL — AB (ref 0.20–1.20)
BUN: 8.3 mg/dL (ref 7.0–26.0)
CO2: 24 mEq/L (ref 22–29)
CREATININE: 0.9 mg/dL (ref 0.7–1.3)
Calcium: 8.9 mg/dL (ref 8.4–10.4)
Chloride: 105 mEq/L (ref 98–109)
Glucose: 222 mg/dl — ABNORMAL HIGH (ref 70–140)
Potassium: 3.7 mEq/L (ref 3.5–5.1)
SODIUM: 142 meq/L (ref 136–145)
TOTAL PROTEIN: 6.9 g/dL (ref 6.4–8.3)

## 2013-11-18 LAB — CBC WITH DIFFERENTIAL/PLATELET
BASO%: 2.9 % — ABNORMAL HIGH (ref 0.0–2.0)
Basophils Absolute: 0.2 10*3/uL — ABNORMAL HIGH (ref 0.0–0.1)
EOS%: 1.3 % (ref 0.0–7.0)
Eosinophils Absolute: 0.1 10*3/uL (ref 0.0–0.5)
HEMATOCRIT: 38.8 % (ref 38.4–49.9)
HGB: 12.7 g/dL — ABNORMAL LOW (ref 13.0–17.1)
LYMPH%: 38 % (ref 14.0–49.0)
MCH: 29.2 pg (ref 27.2–33.4)
MCHC: 32.8 g/dL (ref 32.0–36.0)
MCV: 88.9 fL (ref 79.3–98.0)
MONO#: 1 10*3/uL — AB (ref 0.1–0.9)
MONO%: 12.7 % (ref 0.0–14.0)
NEUT#: 3.4 10*3/uL (ref 1.5–6.5)
NEUT%: 45.1 % (ref 39.0–75.0)
PLATELETS: 277 10*3/uL (ref 140–400)
RBC: 4.37 10*6/uL (ref 4.20–5.82)
RDW: 14.4 % (ref 11.0–14.6)
WBC: 7.5 10*3/uL (ref 4.0–10.3)
lymph#: 2.8 10*3/uL (ref 0.9–3.3)

## 2013-11-18 LAB — PSA: PSA: 0.6 ng/mL (ref <=4.00)

## 2013-11-18 MED ORDER — TAMOXIFEN CITRATE 10 MG PO TABS: 10.0000 mg | ORAL_TABLET | Freq: Two times a day (BID) | ORAL | Status: DC

## 2013-11-18 MED ORDER — CYANOCOBALAMIN 1000 MCG/ML IJ SOLN
1000.0000 ug | Freq: Once | INTRAMUSCULAR | Status: AC
  Administered 2013-11-18: 1000 ug via INTRAMUSCULAR

## 2013-11-18 NOTE — Progress Notes (Signed)
OFFICE PROGRESS NOTE   11/18/2013   Physicians:Y.Lowne, E.Levine, [name].Savage, T.Cornett, S.Dahlstedt, J.Perry   INTERVAL HISTORY:  Patient is seen, alone for visit, in follow up of recently diagnosed T1N0 left breast cancer, recurrent GIST for which he continues Harper, B12 deficiency related to bowel resections for GIST, and history of renal carcinoma. His case was presented at Multidisciplinary Breast Conference after I saw him last, and I spoke with him by phone with recommendations from that group. He has been on tamoxifen 10 mg bid since 10-20-13, taking gleevec 8 hours between the 2 tamoxifen doses. He is tolerating the tamoxifen without apparent difficulty, including no hot flashes, no depression symptoms obvious and no symptoms of blood clots. Henry [name] is irate today because of ongoing insurance concerns, which have caused delay in receiving next supply of gleevec, tho he has several days of the drug still available.     ONCOLOGIC HISTORY Patient had been aware of palpable mass adjacent to left areola for possibly 2-3 months, which had not changed that he could tell during that time. He had bilateral mammograms and left US done at [facility] on 08-28-13, then excisional biopsy by Dr Brantley Stage on 09-23-13. 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 He had re-excision with evaluation of sentinel nodes on 10-14-13  ([id number]), with no residual malignancy in the specimen and 6 sentinel nodes negative. He has had no post operative complications, tho still slight numbness in left axilla. At [facility] extent of surgery was felt to be adequate and radiation oncology did not recommend additional radiation therapy; he is to see genetics counselor 12-28-2013 and understands that recommendations for additional surgery (bilateral mastectomies) may be  considered depending on findings with genetics testing. He is in agreement with continuing tamoxifen now.  GIST was initially disgnosed 2007, with subtotal gastrectomy with Roux-en-Y, then briefly on study with [place] which he did not tolerate due to marked elevations in LFTs. He had recurrent GIST in late 2012 with further surgery by Dr.Levine March 2011, then rapid elevation of LFTs with 50% dosing (200 mg/d) gleevec, with gleevec again discontinued after short period in 2011. He had further progression in June 2012, did tolerate gleevec at 180m/d with initial prednisone, with adequate shrinkage of tumor to allow surgery by Dr. LClovis RileyDec 6, 2012. At laparotomy he had extensive adhesions but no gross peritoneal, hepatic or bowel involvement. The mass itself was cystic and soft and not densely adherent to any visceral structures. Pathology([facility] [phone] recurrent GIST 8.9 cm with resection margins not involved and comment that microscopically this exhibits an epithelial and glandular growth pattern which differs from previous specimens; nuclei medium-sized and bland; immunohistochemical panel postive for c-kit. No mitotic index is reported. He saw Dr.Levine last in Dec 2012. [facility]; last PET Jan 2011.  Henry [name] has needed B12 injections due to gastric/ bowel resection. He was iron deficient related to surgeries and blood draws, previously had marked skin reaction to feraheme, but did tolerate inferon on 12-10-2011 with no difficulty.  He has history of T1a renal cell carcinoma, treated with partial right nephrectomy in 2008 and not active since then.     Review of systems as above, also: No different GI symptoms or abdominal pain. No problems at left breast/ left axillary surgery sites. No fever or  symptoms of infection. Under stress due to insurance problems, financial concerns, and unexpected problems with VISA for upcoming working trip to [place]   Remainder of 10 point Review of Systems negative.  Objective:  Vital signs in last 24 hours:  BP 144/83  Pulse 78  Temp(Src) 97.8 F (36.6 C) (Oral)  Resp 20  Ht 6' (1.829 m)  Wt 133 lb 6.4 oz (60.51 kg)  BMI 18.09 kg/m2 Weight is down 1.5 lbs.  Alert, oriented, angry related to insurance etc. Quickly and easily mobile.   HEENT:PERRL, sclerae not icteric. Oral mucosa moist without lesions, posterior pharynx clear.  Neck supple. No JVD.  Lymphatics:no cervical,suraclavicular, axillary adenopathy Resp: clear to auscultation bilaterally and normal percussion bilaterally Cardio: regular rate and rhythm. No gallop. GI: soft, nontender, not distended, no appreciable mass or organomegaly. Normally active bowel sounds. Surgical incisions not remarkable. Musculoskeletal/ Extremities: without pitting edema, cords, tenderness Neuro:  Nonfocal. Psych no apparent depression Skin without rash, ecchymosis, petechiae Breasts: right without dominant mass, skin or nipple findings. Left with surgical glue still at biopsy site, which is closed, nontender, no erythema. No palpable mass  Right axilla benign. Left axilla with surgical incision closed/ healed, no palpable adenopathy, not tender. No swelling LUE and easy ROM at shoulder   Lab Results:  Results for orders placed in visit on 11/18/13  CBC WITH DIFFERENTIAL      Result Value Range   WBC 7.5  4.0 - 10.3 10e3/uL   NEUT# 3.4  1.5 - 6.5 10e3/uL   HGB 12.7 (*) 13.0 - 17.1 g/dL   HCT 38.8  38.4 - 49.9 %   Platelets 277  140 - 400 10e3/uL   MCV 88.9  79.3 - 98.0 fL   MCH 29.2  27.2 - 33.4 pg   MCHC 32.8  32.0 - 36.0 g/dL   RBC 4.37  4.20 - 5.82 10e6/uL   RDW 14.4  11.0 - 14.6 %   lymph# 2.8  0.9 - 3.3 10e3/uL   MONO# 1.0 (*) 0.1 - 0.9 10e3/uL   Eosinophils Absolute 0.1  0.0 - 0.5 10e3/uL   Basophils Absolute 0.2 (*) 0.0 - 0.1 10e3/uL   NEUT% 45.1  39.0 - 75.0 %   LYMPH% 38.0  14.0 - 49.0 %   MONO% 12.7  0.0 - 14.0 %   EOS% 1.3   0.0 - 7.0 %   BASO% 2.9 (*) 0.0 - 2.0 %  COMPREHENSIVE METABOLIC PANEL (NW29)      Result Value Range   Sodium 142  136 - 145 mEq/L   Potassium 3.7  3.5 - 5.1 mEq/L   Chloride 105  98 - 109 mEq/L   CO2 24  22 - 29 mEq/L   Glucose 222 (*) 70 - 140 mg/dl   BUN 8.3  7.0 - 26.0 mg/dL   Creatinine 0.9  0.7 - 1.3 mg/dL   Total Bilirubin 1.23 (*) 0.20 - 1.20 mg/dL   Alkaline Phosphatase 32 (*) 40 - 150 U/L   AST 20  5 - 34 U/L   ALT 14  0 - 55 U/L   Total Protein 6.9  6.4 - 8.3 g/dL   Albumin 3.7  3.5 - 5.0 g/dL   Calcium 8.9  8.4 - 10.4 mg/dL   Anion Gap 13 (*) 3 - 11 mEq/L     Studies/Results:  No results found.  Medications: I have reviewed the patient's current medications. B12 given today.  DISCUSSION: we reviewed recommendations from [facility] and my discussion  with colleagues re genetics information in this regard  Assessment/Plan: 1. 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  Tamoxifen 10 mg bid begun 10-20-13, this dosing due to potential for interaction with gleevec. Case  presented at multidisciplinary breast conference with recommendations as above. CT CAP was done 08-28-13 (done then in follow up of GIST) without evidence of metastatic disease. Genetics counseling pending.   2.GIST: initially diagnosed 2007 with subtotal gastrectomy with Roux-en-Y, then briefly on study with [place] which he did not tolerate due to marked elevations in LFTs. Recurrent GIST with surgery by Dr Clovis Riley March 2011, then rapid elevation of LFTs with 50% dose reduction of Gleevec. Further progression June 2012, tolerated gleevec at 100 mg daily with initial steroids, further surgery Dec 2012. Continuing gleevec at 100 mg daily  3.T1a renal cell carcinoma treated with right partial nephrectomy 2008.  4.B12 deficiency due to gastrectomy, on monthly B12  5.iron  deficiency due to gastrectomy: intolerant to feraheme with severe skin reaction, did tolerate inferon in 11-2011.  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 Insurance concerns ongoing. 10.past diagnosis of manic depressive illness, tho I have never been clear about how that diagnosis was established.    He will have B12 injection with genetics appointment on 12-28-13, then I will see him in April with next injection. I have asked patient to let us know if problems obtaining gleevec cannot be resolved prior to running out of his present supply of the medication.  He has had questions answered to best of my ability and expresses appreciation for care.   Henry [name],Henry P, MD   11/18/2013, 2:50 PM

## 2013-11-19 ENCOUNTER — Telehealth: Payer: Self-pay | Admitting: *Deleted

## 2013-11-19 NOTE — Telephone Encounter (Signed)
Received faxed request from [employer] for additional notes from the clinic due to prior authorization needed on pt's medication.  Gave message to HIM for assistance. [employer] phone   [phone]   ;    Fax    [phone].

## 2013-11-20 ENCOUNTER — Telehealth: Payer: Self-pay | Admitting: Oncology

## 2013-11-20 ENCOUNTER — Encounter: Payer: Self-pay | Admitting: Oncology

## 2013-11-20 NOTE — Progress Notes (Signed)
[employer], [id number], approved gleevec from 11/19/13-11/18/14

## 2013-11-20 NOTE — Telephone Encounter (Signed)
, °

## 2013-11-22 ENCOUNTER — Other Ambulatory Visit: Payer: Self-pay | Admitting: Oncology

## 2013-11-23 ENCOUNTER — Encounter: Payer: Self-pay | Admitting: *Deleted

## 2013-11-23 NOTE — Progress Notes (Signed)
RECEIVED A FAX FROM [employer] CONCERNING A CONFIRMATION OF PRESCRIPTION SHIPMENT FOR GLEEVEC ON 11/21/23.

## 2013-11-30 ENCOUNTER — Other Ambulatory Visit: Payer: BC Managed Care – PPO

## 2013-11-30 ENCOUNTER — Ambulatory Visit: Payer: BC Managed Care – PPO | Admitting: Oncology

## 2013-11-30 ENCOUNTER — Ambulatory Visit: Payer: BC Managed Care – PPO

## 2013-12-28 ENCOUNTER — Encounter: Payer: Self-pay | Admitting: Genetic Counselor

## 2013-12-28 ENCOUNTER — Ambulatory Visit (HOSPITAL_BASED_OUTPATIENT_CLINIC_OR_DEPARTMENT_OTHER): Payer: 59

## 2013-12-28 ENCOUNTER — Ambulatory Visit: Payer: 59 | Admitting: Genetic Counselor

## 2013-12-28 ENCOUNTER — Other Ambulatory Visit (HOSPITAL_BASED_OUTPATIENT_CLINIC_OR_DEPARTMENT_OTHER): Payer: 59

## 2013-12-28 VITALS — BP 124/71 | HR 85 | Temp 98.9°F

## 2013-12-28 DIAGNOSIS — C49A0 Gastrointestinal stromal tumor, unspecified site: Secondary | ICD-10-CM

## 2013-12-28 DIAGNOSIS — C50422 Malignant neoplasm of upper-outer quadrant of left male breast: Secondary | ICD-10-CM

## 2013-12-28 DIAGNOSIS — Z803 Family history of malignant neoplasm of breast: Secondary | ICD-10-CM

## 2013-12-28 DIAGNOSIS — E538 Deficiency of other specified B group vitamins: Secondary | ICD-10-CM

## 2013-12-28 DIAGNOSIS — Z8041 Family history of malignant neoplasm of ovary: Secondary | ICD-10-CM

## 2013-12-28 DIAGNOSIS — C649 Malignant neoplasm of unspecified kidney, except renal pelvis: Secondary | ICD-10-CM

## 2013-12-28 DIAGNOSIS — C494 Malignant neoplasm of connective and soft tissue of abdomen: Secondary | ICD-10-CM

## 2013-12-28 LAB — CBC WITH DIFFERENTIAL/PLATELET
BASO%: 1 % (ref 0.0–2.0)
BASOS ABS: 0.1 10*3/uL (ref 0.0–0.1)
EOS%: 2.2 % (ref 0.0–7.0)
Eosinophils Absolute: 0.2 10*3/uL (ref 0.0–0.5)
HEMATOCRIT: 39.5 % (ref 38.4–49.9)
HEMOGLOBIN: 12.4 g/dL — AB (ref 13.0–17.1)
LYMPH%: 36.7 % (ref 14.0–49.0)
MCH: 27.9 pg (ref 27.2–33.4)
MCHC: 31.4 g/dL — ABNORMAL LOW (ref 32.0–36.0)
MCV: 88.6 fL (ref 79.3–98.0)
MONO#: 1 10*3/uL — ABNORMAL HIGH (ref 0.1–0.9)
MONO%: 10.7 % (ref 0.0–14.0)
NEUT%: 49.4 % (ref 39.0–75.0)
NEUTROS ABS: 4.4 10*3/uL (ref 1.5–6.5)
PLATELETS: 296 10*3/uL (ref 140–400)
RBC: 4.45 10*6/uL (ref 4.20–5.82)
RDW: 14.6 % (ref 11.0–14.6)
WBC: 8.8 10*3/uL (ref 4.0–10.3)
lymph#: 3.2 10*3/uL (ref 0.9–3.3)

## 2013-12-28 MED ORDER — CYANOCOBALAMIN 1000 MCG/ML IJ SOLN
1000.0000 ug | Freq: Once | INTRAMUSCULAR | Status: AC
  Administered 2013-12-28: 1000 ug via INTRAMUSCULAR

## 2013-12-28 NOTE — Progress Notes (Signed)
Dr.  Evlyn Clines requested a consultation for genetic counseling and risk assessment for Henry [name], a 63 y.o. male, for discussion of his personal history of GIST, renal cell carcinoma and breast cancer and a family history of breast and ovarian cancer.  He presents to clinic today to discuss the possibility of a genetic predisposition to cancer, and to further clarify his risks, as well as his family members' risks for cancer.   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 This was treated with Gleevac and prednisone.  In 2008, at the age of 18, he was diagnosed with renal cell carcinoma which was treated with surgery.  In 2015, at the age of 57, he was diagnosed with breast cancer. He denies a history of uncontrolled blood pressure, tinnitus, headaches, profuse sweating, palpitations and anxiety.  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  Past Medical History  Diagnosis Date  . 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 Pneumothorax, spontaneous, tension 1992    TREATED WITH A CHEST TUBE  . Cancer NOVEMBER 2008    T1a PAPILLARY RENAL CELL CARCINOMA  . B12 nutritional deficiency     RELATED TO PREVIOUS SURGRIES/ ON MONTHLY SUPPLEMENT  . Depression NOTED IN NOTE 05-28-11    HISTORY OF MANIC  DEPRESSIVE TYPE DISORDER  . Anemia 2008    RESOLVED  WITH IV IRON DEXTRAN AS HE WAS UNABLE  TO ABSORB  ORAL IRON.    . Breast cancer     Past Surgical History  Procedure Laterality Date  . Bilroth ii procedure  12-31-2005  . Cholecystectomy  12-31-2005    DONE WITH GASTRECTOMY BY DR. Marcello Moores CORNETT  . Partial nephrectomy  NOVEMBER 2008    RIGHT SIDE/ FOR T1a PAPILLARY RENAL CELL CARCINOMA  . Tumor removal  MARCH 2011    FOR RECURRENT GIST BY DR. Clovis Riley AT [facility]  . Re-excision of breast lumpectomy Left 10/14/2013    Procedure: RE-EXCISION OF BREAST LUMPECTOMY;  Surgeon: Marcello Moores A.  Cornett, MD;  Location: [place];  Service: General;  Laterality: Left;  . Axillary sentinel node biopsy Left 10/14/2013    Procedure: sentinel lymph node mapping ;  Surgeon: Marcello Moores A. Cornett, MD;  Location: [place];  Service: General;  Laterality: Left;    History   Social History  . Marital Status: Widowed    Spouse Name: N/A    Number of Children: 0  . Years of Education: N/A   Occupational History  . OWNER    Social History Main Topics  . Smoking status: Former Smoker -- 1.50 packs/day for 25 years    Quit date: 07/03/1989  . Smokeless tobacco: None  . Alcohol Use: No  . Drug Use: No  . Sexual Activity: None   Other Topics Concern  . None   Social History Narrative  . None    FAMILY HISTORY:  We obtained a detailed, 4-generation family history.  Significant diagnoses are listed below: Family History  Problem Relation Age of Onset  . Ovarian cancer Mother   . Breast cancer Mother   . Brain cancer Cousin 2    maternal cousin  . Brain cancer Cousin     paternal cousin, dx in his 57s  Alcohol abuse runs in his family, as both of his parents and several siblings have had problems.  Patient's maternal ancestors are of  English descent, and paternal ancestors are of [place] descent. There is no reported Ashkenazi Jewish ancestry. There is no known consanguinity.  GENETIC COUNSELING ASSESSMENT: Henry [name] is a 63 y.o. male with a personal history of GIST, renal cell and breast cancer and family history of breast and ovarian cancer which somewhat suggestive of a hereditary cancer syndrome including breast cancer and paraganglioma/pheochromocytoma syndromes and predisposition to cancer. We, therefore, discussed and recommended the following at today's visit.   DISCUSSION: We reviewed the characteristics, features and inheritance patterns of hereditary cancer syndromes. We also discussed genetic testing, including the  appropriate family members to test, the process of testing, insurance coverage and turn-around-time for results. We reviewed several hereditary breast cancer syndromes that are associated with male breast cancer including BRCA mutations, PALB2 and CHEK2 mutations.  We also reviewed the paraganglioma/pheochromocytoma syndromes.  Based on the combination of cancers, we recommended the CancerNext-Expanded panel so that testing can be performed on breast cancer, renal cancer and paraganglioma syndromes.  PLAN: After considering the risks, benefits, and limitations, [name] provided informed consent to pursue genetic testing and the blood sample will be sent to [facility] for analysis of the CancerNext-Expanded. We discussed the implications of a positive, negative and/ or variant of uncertain significance genetic test result. Results should be available within approximately 12 weeks' time, at which point they will be disclosed by telephone to [facility], as will any additional recommendations warranted by these results. [name] will receive a summary of his genetic counseling visit and a copy of his results once available. This information will also be available in Epic. We encouraged Sandy Blouch Dockter to remain in contact with cancer genetics annually so that we can continuously update the family history and inform him of any changes in cancer genetics and testing that may be of benefit for his family. [name]'s questions were answered to his satisfaction today. Our contact information was provided should additional questions or concerns arise.  The patient was seen for a total of 60 minutes, greater than 50% of which was spent face-to-face counseling.  This note will also be sent to the referring provider via the electronic medical record. The patient will be supplied with a summary of this genetic counseling discussion as well as educational information on the  discussed hereditary cancer syndromes following the conclusion of their visit.   Patient was discussed with Dr. Marcy Panning.   _______________________________________________________________________ For Office Staff:  Number of people involved in session: 1 Was an Intern/ student involved with case: yes

## 2013-12-28 NOTE — Patient Instructions (Signed)
Vitamin B12 Injections Every person needs vitamin B12. A deficiency develops when the body does not get enough of it. One way to overcome this is by getting B12 shots (injections). A B12 shot puts the vitamin directly into muscle tissue. This avoids any problems your body might have in absorbing it from food or a pill. In some people, the body has trouble using the vitamin correctly. This can cause a B12 deficiency. Not consuming enough of the vitamin can also cause a deficiency. Getting enough vitamin B12 can be hard for elderly people. Sometimes, they do not eat a well-balanced diet. The elderly are also more likely than younger people to have medical conditions or take medications that can lead to a deficiency. WHAT DOES VITAMIN B12 DO? Vitamin B12 does many things to help the body work right:  It helps the body make healthy red blood cells.  It helps maintain nerve cells.  It is involved in the body's process of converting food into energy (metabolism).  It is needed to make the genetic material in all cells (DNA). VITAMIN B12 FOOD SOURCES Most people get plenty of vitamin B12 through the foods they eat. It is present in:  Meat, fish, poultry, and eggs.  Milk and milk products.  It also is added when certain foods are made, including some breads, cereals and yogurts. The food is then called "fortified". CAUSES The most common causes of vitamin B12 deficiency are:  Pernicious anemia. The condition develops when the body cannot make enough healthy red blood cells. This stems from a lack of a protein made in the stomach (intrinsic factor). People without this protein cannot absorb enough vitamin B12 from food.  Malabsorption. This is when the body cannot absorb the vitamin. It can be caused by:  Pernicious anemia.  Surgery to remove part or all of the stomach can lead to malabsorption. Removal of part or all of the small intestine can also cause malabsorption.  Vegetarian diet.  People who are strict about not eating foods from animals could have trouble taking in enough vitamin B12 from diet alone.  Medications. Some medicines have been linked to B12 deficiency, such as Metformin (a drug prescribed for type 2 diabetes). Long-term use of stomach acid suppressants also can keep the vitamin from being absorbed.  Intestinal problems such as inflammatory bowel disease. If there are problems in the digestive tract, vitamin B12 may not be absorbed in good enough amounts. SYMPTOMS People who do not get enough B12 can develop problems. These can include:  Anemia. This is when the body has too few red blood cells. Red blood cells carry oxygen to the rest of the body. Without a healthy supply of red blood cells, people can feel:  Tired (fatigued).  Weak.  Severe anemia can cause:  Shortness of breath.  Dizziness.  Rapid heart rate.  Paleness.  Other Vitamin B12 deficiency symptoms include:  Diarrhea.  Numbness or tingling in the hands or feet.  Loss of appetite.  Confusion.  Sores on the tongue or in the mouth. LET YOUR CAREGIVER KNOW ABOUT:  Any allergies. It is very important to know if you are allergic or sensitive to cobalt. Vitamin B12 contains cobalt.  Any history of kidney disease.  All medications you are taking. Include prescription and over-the-counter medicines, herbs and creams.  Whether you are pregnant or breast-feeding.  If you have Leber's disease, a hereditary eye condition, vitamin B12 could make it worse. RISKS AND COMPLICATIONS Reactions to an injection are   usually temporary. They might include:  Pain at the injection site.  Redness, swelling or tenderness at the site.  Headache, dizziness or weakness.  Nausea, upset stomach or diarrhea.  Numbness or tingling.  Fever.  Joint pain.  Itching or rash. If a reaction does not go away in a short while, talk with your healthcare provider. A change in the way the shots are  given, or where they are given, might need to be made. BEFORE AN INJECTION To decide whether B12 injections are right for you, your healthcare provider will probably:  Ask about your medical history.  Ask questions about your diet.  Ask about symptoms such as:  Have you felt weak?  Do you feel unusually tired?  Do you get dizzy?  Order blood tests. These may include a test to:  Check the level of red cells in your blood.  Measure B12 levels.  Check for the presence of intrinsic factor. VITAMIN B12 INJECTIONS How often you will need a vitamin B12 injection will depend on how severe your deficiency is. This also will affect how long you will need to get them. People with pernicious anemia usually get injections for their entire life. Others might get them for a shorter period. For many people, injections are given daily or weekly for several weeks. Then, once B12 levels are normal, injections are given just once a month. If the cause of the deficiency can be fixed, the injections can be stopped. Talk with your healthcare provider about what you should expect. For an injection:  The injection site will be cleaned with an alcohol swab.  Your healthcare provider will insert a needle directly into a muscle. Most any muscle can be used. Most often, an arm muscle is used. A buttocks muscle can also be used. Many people say shots in that area are less painful.  A small adhesive bandage may be put over the injection site. It usually can be taken off in an hour or less. Injections can be given by your healthcare provider. In some cases, family members give them. Sometimes, people give them to themselves. Talk with your healthcare provider about what would be best for you. If someone other than your healthcare provider will be giving the shots, the person will need to be trained to give them correctly. HOME CARE INSTRUCTIONS   You can remove the adhesive bandage within an hour of getting a  shot.  You should be able to go about your normal activities right away.  Avoid drinking large amounts of alcohol while taking vitamin B12 shots. Alcohol can interfere with the body's use of the vitamin. SEEK MEDICAL CARE IF:   Pain, redness, swelling or tenderness at the injection site does not get better or gets worse.  Headache, dizziness or weakness does not go away.  You develop a fever of more than 100.5 F (38.1 C). SEEK IMMEDIATE MEDICAL CARE IF:   You have chest pain.  You develop shortness of breath.  You have muscle weakness that gets worse.  You develop numbness, weakness or tingling on one side or one area of the body.  You have symptoms of an allergic reaction, such as:  Hives.  Difficulty breathing.  Swelling of the lips, face, tongue or throat.  You develop a fever of more than 102.0 F (38.9 C). MAKE SURE YOU:   Understand these instructions.  Will watch your condition.  Will get help right away if you are not doing well or get worse. Document   Released: 01/04/2009 Document Revised: 12/31/2011 Document Reviewed: 01/04/2009 ExitCare Patient Information 2014 [employer].  

## 2013-12-29 ENCOUNTER — Telehealth: Payer: Self-pay

## 2013-12-29 NOTE — Telephone Encounter (Signed)
Discussed the results of CBC as noted below by Dr. Marko Plume with Mr. Correa.   Mr. Hemann stated that has resolved his insurance issue and has his gleevec and tamoxifen medications.

## 2013-12-29 NOTE — Telephone Encounter (Signed)
Message copied by Baruch Merl on Tue Dec 29, 2013  4:01 PM ------      Message from: Gordy Levan      Created: Tue Dec 29, 2013  2:22 PM       Labs seen and need follow up: please let him know CBC stable, hgb 12.4 and size of RBCs ok indicating iron still ok. He was having some insurance issues with gleevec, so if you speak with him please ask if he has this and tamoxifen now.  thanks ------

## 2014-01-13 ENCOUNTER — Telehealth: Payer: Self-pay | Admitting: Genetic Counselor

## 2014-01-13 NOTE — Telephone Encounter (Signed)
Left good news message on VM. 

## 2014-01-18 ENCOUNTER — Telehealth: Payer: Self-pay | Admitting: Genetic Counselor

## 2014-01-18 NOTE — Telephone Encounter (Signed)
Left good news message on VM. 

## 2014-01-20 ENCOUNTER — Telehealth: Payer: Self-pay | Admitting: Genetic Counselor

## 2014-01-20 ENCOUNTER — Encounter: Payer: Self-pay | Admitting: Genetic Counselor

## 2014-01-20 NOTE — Telephone Encounter (Signed)
Patient had called and asked that I call back.  I called and left good news message on machine, indicating that he was negative for mutations within the 43 genes we looked at.  I will send a copy of his results, along with a letter, so that he has this for his records.  IF he has questions he may call me.

## 2014-01-25 ENCOUNTER — Ambulatory Visit: Payer: BC Managed Care – PPO

## 2014-02-05 ENCOUNTER — Other Ambulatory Visit: Payer: Self-pay

## 2014-02-05 ENCOUNTER — Other Ambulatory Visit: Payer: Self-pay | Admitting: Oncology

## 2014-02-05 DIAGNOSIS — C50422 Malignant neoplasm of upper-outer quadrant of left male breast: Secondary | ICD-10-CM

## 2014-02-05 DIAGNOSIS — C49A0 Gastrointestinal stromal tumor, unspecified site: Secondary | ICD-10-CM

## 2014-02-08 ENCOUNTER — Ambulatory Visit: Payer: Self-pay | Admitting: Oncology

## 2014-02-08 ENCOUNTER — Other Ambulatory Visit: Payer: BC Managed Care – PPO

## 2014-02-08 ENCOUNTER — Other Ambulatory Visit: Payer: Self-pay | Admitting: Oncology

## 2014-02-08 ENCOUNTER — Ambulatory Visit: Payer: Self-pay

## 2014-02-08 ENCOUNTER — Ambulatory Visit (HOSPITAL_BASED_OUTPATIENT_CLINIC_OR_DEPARTMENT_OTHER): Payer: Self-pay

## 2014-02-08 ENCOUNTER — Other Ambulatory Visit (HOSPITAL_BASED_OUTPATIENT_CLINIC_OR_DEPARTMENT_OTHER): Payer: Self-pay

## 2014-02-08 ENCOUNTER — Telehealth: Payer: Self-pay | Admitting: Oncology

## 2014-02-08 VITALS — BP 128/84 | HR 70 | Temp 97.8°F

## 2014-02-08 DIAGNOSIS — C494 Malignant neoplasm of connective and soft tissue of abdomen: Secondary | ICD-10-CM

## 2014-02-08 DIAGNOSIS — C649 Malignant neoplasm of unspecified kidney, except renal pelvis: Secondary | ICD-10-CM

## 2014-02-08 DIAGNOSIS — C49A0 Gastrointestinal stromal tumor, unspecified site: Secondary | ICD-10-CM

## 2014-02-08 DIAGNOSIS — C50422 Malignant neoplasm of upper-outer quadrant of left male breast: Secondary | ICD-10-CM

## 2014-02-08 LAB — URINALYSIS, MICROSCOPIC - CHCC
BILIRUBIN (URINE): NEGATIVE
GLUCOSE UR CHCC: NEGATIVE mg/dL
Ketones: NEGATIVE mg/dL
Leukocyte Esterase: NEGATIVE
Nitrite: NEGATIVE
Protein: 30 mg/dL
Specific Gravity, Urine: 1.025 (ref 1.003–1.035)
Urobilinogen, UR: 0.2 mg/dL (ref 0.2–1)
WBC UA: NEGATIVE (ref 0–2)
pH: 6 (ref 4.6–8.0)

## 2014-02-08 LAB — CBC WITH DIFFERENTIAL/PLATELET
BASO%: 1.5 % (ref 0.0–2.0)
Basophils Absolute: 0.1 10*3/uL (ref 0.0–0.1)
EOS ABS: 0 10*3/uL (ref 0.0–0.5)
EOS%: 0.2 % (ref 0.0–7.0)
HCT: 33.7 % — ABNORMAL LOW (ref 38.4–49.9)
HGB: 10.8 g/dL — ABNORMAL LOW (ref 13.0–17.1)
LYMPH%: 38.4 % (ref 14.0–49.0)
MCH: 27.5 pg (ref 27.2–33.4)
MCHC: 32.2 g/dL (ref 32.0–36.0)
MCV: 85.6 fL (ref 79.3–98.0)
MONO#: 0.9 10*3/uL (ref 0.1–0.9)
MONO%: 11.6 % (ref 0.0–14.0)
NEUT#: 3.6 10*3/uL (ref 1.5–6.5)
NEUT%: 48.3 % (ref 39.0–75.0)
Platelets: 292 10*3/uL (ref 140–400)
RBC: 3.94 10*6/uL — AB (ref 4.20–5.82)
RDW: 14.8 % — ABNORMAL HIGH (ref 11.0–14.6)
WBC: 7.4 10*3/uL (ref 4.0–10.3)
lymph#: 2.9 10*3/uL (ref 0.9–3.3)

## 2014-02-08 LAB — COMPREHENSIVE METABOLIC PANEL (CC13)
ALT: 10 U/L (ref 0–55)
AST: 18 U/L (ref 5–34)
Albumin: 3.6 g/dL (ref 3.5–5.0)
Alkaline Phosphatase: 28 U/L — ABNORMAL LOW (ref 40–150)
Anion Gap: 7 mEq/L (ref 3–11)
BILIRUBIN TOTAL: 1.17 mg/dL (ref 0.20–1.20)
BUN: 10.7 mg/dL (ref 7.0–26.0)
CO2: 29 mEq/L (ref 22–29)
Calcium: 8.8 mg/dL (ref 8.4–10.4)
Chloride: 110 mEq/L — ABNORMAL HIGH (ref 98–109)
Creatinine: 1 mg/dL (ref 0.7–1.3)
Glucose: 81 mg/dl (ref 70–140)
Potassium: 3.4 mEq/L — ABNORMAL LOW (ref 3.5–5.1)
SODIUM: 146 meq/L — AB (ref 136–145)
Total Protein: 6.5 g/dL (ref 6.4–8.3)

## 2014-02-08 MED ORDER — CYANOCOBALAMIN 1000 MCG/ML IJ SOLN
1000.0000 ug | Freq: Once | INTRAMUSCULAR | Status: AC
  Administered 2014-02-08: 1000 ug via INTRAMUSCULAR

## 2014-02-08 NOTE — Patient Instructions (Signed)
Vitamin B12 Injections Every person needs vitamin B12. A deficiency develops when the body does not get enough of it. One way to overcome this is by getting B12 shots (injections). A B12 shot puts the vitamin directly into muscle tissue. This avoids any problems your body might have in absorbing it from food or a pill. In some people, the body has trouble using the vitamin correctly. This can cause a B12 deficiency. Not consuming enough of the vitamin can also cause a deficiency. Getting enough vitamin B12 can be hard for elderly people. Sometimes, they do not eat a well-balanced diet. The elderly are also more likely than younger people to have medical conditions or take medications that can lead to a deficiency. WHAT DOES VITAMIN B12 DO? Vitamin B12 does many things to help the body work right:  It helps the body make healthy red blood cells.  It helps maintain nerve cells.  It is involved in the body's process of converting food into energy (metabolism).  It is needed to make the genetic material in all cells (DNA). VITAMIN B12 FOOD SOURCES Most people get plenty of vitamin B12 through the foods they eat. It is present in:  Meat, fish, poultry, and eggs.  Milk and milk products.  It also is added when certain foods are made, including some breads, cereals and yogurts. The food is then called "fortified". CAUSES The most common causes of vitamin B12 deficiency are:  Pernicious anemia. The condition develops when the body cannot make enough healthy red blood cells. This stems from a lack of a protein made in the stomach (intrinsic factor). People without this protein cannot absorb enough vitamin B12 from food.  Malabsorption. This is when the body cannot absorb the vitamin. It can be caused by:  Pernicious anemia.  Surgery to remove part or all of the stomach can lead to malabsorption. Removal of part or all of the small intestine can also cause malabsorption.  Vegetarian diet.  People who are strict about not eating foods from animals could have trouble taking in enough vitamin B12 from diet alone.  Medications. Some medicines have been linked to B12 deficiency, such as Metformin (a drug prescribed for type 2 diabetes). Long-term use of stomach acid suppressants also can keep the vitamin from being absorbed.  Intestinal problems such as inflammatory bowel disease. If there are problems in the digestive tract, vitamin B12 may not be absorbed in good enough amounts. SYMPTOMS People who do not get enough B12 can develop problems. These can include:  Anemia. This is when the body has too few red blood cells. Red blood cells carry oxygen to the rest of the body. Without a healthy supply of red blood cells, people can feel:  Tired (fatigued).  Weak.  Severe anemia can cause:  Shortness of breath.  Dizziness.  Rapid heart rate.  Paleness.  Other Vitamin B12 deficiency symptoms include:  Diarrhea.  Numbness or tingling in the hands or feet.  Loss of appetite.  Confusion.  Sores on the tongue or in the mouth. LET YOUR CAREGIVER KNOW ABOUT:  Any allergies. It is very important to know if you are allergic or sensitive to cobalt. Vitamin B12 contains cobalt.  Any history of kidney disease.  All medications you are taking. Include prescription and over-the-counter medicines, herbs and creams.  Whether you are pregnant or breast-feeding.  If you have Leber's disease, a hereditary eye condition, vitamin B12 could make it worse. RISKS AND COMPLICATIONS Reactions to an injection are   usually temporary. They might include:  Pain at the injection site.  Redness, swelling or tenderness at the site.  Headache, dizziness or weakness.  Nausea, upset stomach or diarrhea.  Numbness or tingling.  Fever.  Joint pain.  Itching or rash. If a reaction does not go away in a short while, talk with your healthcare provider. A change in the way the shots are  given, or where they are given, might need to be made. BEFORE AN INJECTION To decide whether B12 injections are right for you, your healthcare provider will probably:  Ask about your medical history.  Ask questions about your diet.  Ask about symptoms such as:  Have you felt weak?  Do you feel unusually tired?  Do you get dizzy?  Order blood tests. These may include a test to:  Check the level of red cells in your blood.  Measure B12 levels.  Check for the presence of intrinsic factor. VITAMIN B12 INJECTIONS How often you will need a vitamin B12 injection will depend on how severe your deficiency is. This also will affect how long you will need to get them. People with pernicious anemia usually get injections for their entire life. Others might get them for a shorter period. For many people, injections are given daily or weekly for several weeks. Then, once B12 levels are normal, injections are given just once a month. If the cause of the deficiency can be fixed, the injections can be stopped. Talk with your healthcare provider about what you should expect. For an injection:  The injection site will be cleaned with an alcohol swab.  Your healthcare provider will insert a needle directly into a muscle. Most any muscle can be used. Most often, an arm muscle is used. A buttocks muscle can also be used. Many people say shots in that area are less painful.  A small adhesive bandage may be put over the injection site. It usually can be taken off in an hour or less. Injections can be given by your healthcare provider. In some cases, family members give them. Sometimes, people give them to themselves. Talk with your healthcare provider about what would be best for you. If someone other than your healthcare provider will be giving the shots, the person will need to be trained to give them correctly. HOME CARE INSTRUCTIONS   You can remove the adhesive bandage within an hour of getting a  shot.  You should be able to go about your normal activities right away.  Avoid drinking large amounts of alcohol while taking vitamin B12 shots. Alcohol can interfere with the body's use of the vitamin. SEEK MEDICAL CARE IF:   Pain, redness, swelling or tenderness at the injection site does not get better or gets worse.  Headache, dizziness or weakness does not go away.  You develop a fever of more than 100.5 F (38.1 C). SEEK IMMEDIATE MEDICAL CARE IF:   You have chest pain.  You develop shortness of breath.  You have muscle weakness that gets worse.  You develop numbness, weakness or tingling on one side or one area of the body.  You have symptoms of an allergic reaction, such as:  Hives.  Difficulty breathing.  Swelling of the lips, face, tongue or throat.  You develop a fever of more than 102.0 F (38.9 C). MAKE SURE YOU:   Understand these instructions.  Will watch your condition.  Will get help right away if you are not doing well or get worse. Document   Released: 01/04/2009 Document Revised: 12/31/2011 Document Reviewed: 01/04/2009 ExitCare Patient Information 2014 [employer].  

## 2014-02-08 NOTE — Telephone Encounter (Signed)
pt called and need to r/s lab and inj later today...done pt aware of time....pt will sched lab/est/inj when he comes today.

## 2014-02-09 ENCOUNTER — Telehealth: Payer: Self-pay | Admitting: *Deleted

## 2014-02-09 NOTE — Telephone Encounter (Signed)
Left message with results below, asked patient to call us back.

## 2014-02-09 NOTE — Telephone Encounter (Signed)
Message copied by Patton Salles on Tue Feb 09, 2014  9:27 AM ------      Message from: Evlyn Clines P      Created: Mon Feb 08, 2014  3:52 PM       Labs seen and need follow up: hemoglobin is lower today -- is he symptomatic, has he seen any bleeding? Please be sure he gets rescheduled to me since I did not see him today ------

## 2014-02-13 IMAGING — CT CT CHEST W/ CM
2 of 5 series · 16 of 46 positions shown, 18 images · IV contrast (OMNIPAQUE)
Comparison: CT abdomen dated 02/16/2013. PET-CT dated 11/11/2009.

CLINICAL DATA: Cough, follow-up resected GIST, history of renal
cell cancer. Prior splenectomy and cholecystectomy.

EXAM:
CT CHEST, ABDOMEN, AND PELVIS WITH CONTRAST
TECHNIQUE: Multidetector CT imaging of the chest, abdomen and pelvis was
performed following the standard protocol during bolus
administration of intravenous contrast.
Note: The study was ordered as a chest and abdomen. The pelvis was
imaged an inadvertently and the patient should not be charged for
this portion of the examination.
CONTRAST:  100mL OMNIPAQUE IOHEXOL 300 MG/ML  SOLN

[Series 2: cap with st · axial · 0.76mm/px · z∈[-764,-179]mm · 13 of 133 slices shown, 15 images]
[im 8/133  soft-tissue]
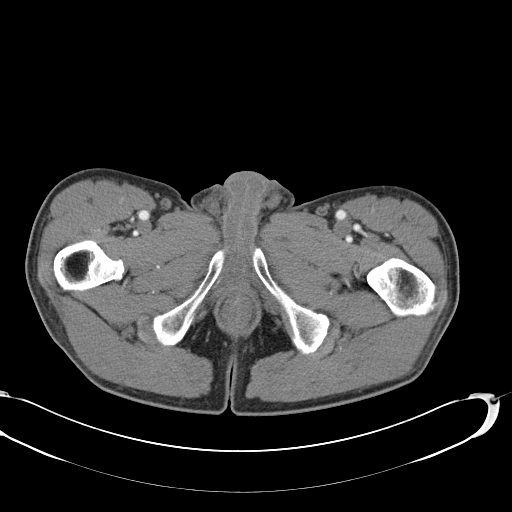
[im 8/133  bone]
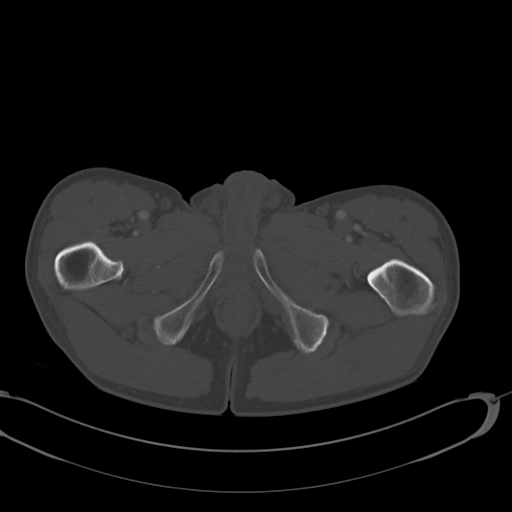
[im 16/133  soft-tissue]
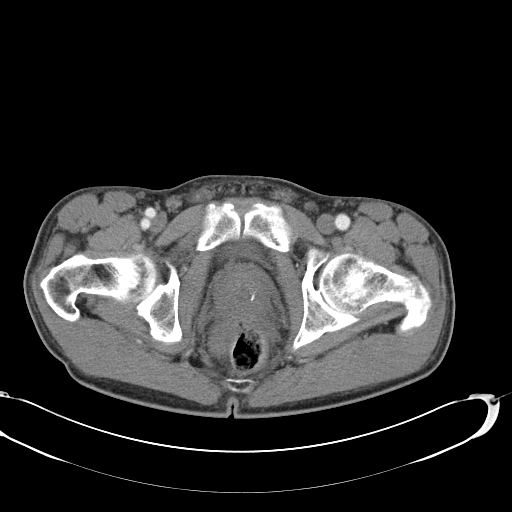
[im 32/133  soft-tissue]
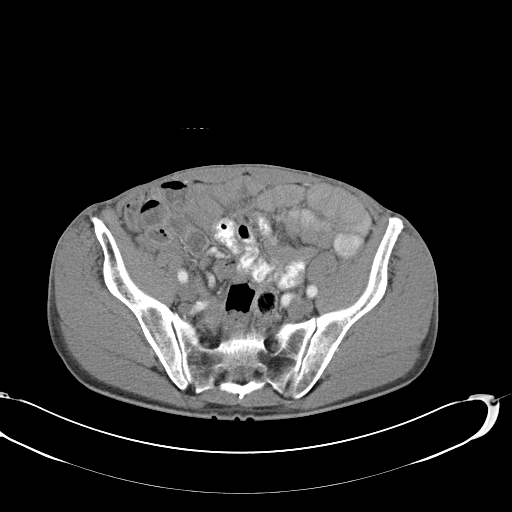
[im 39/133  soft-tissue]
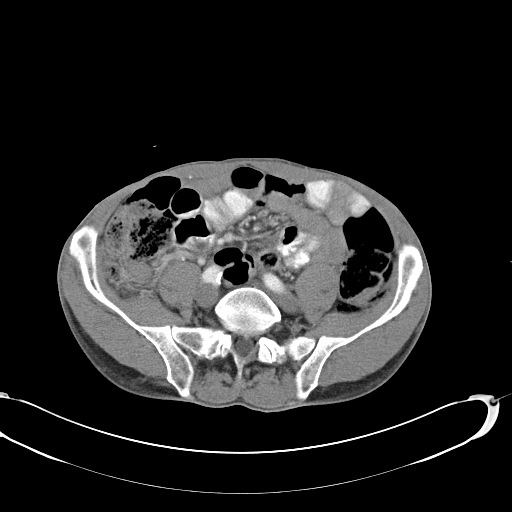
[im 47/133  soft-tissue]
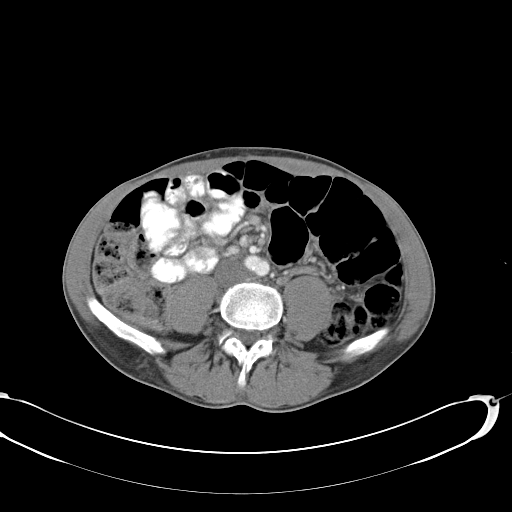
[im 55/133  soft-tissue]
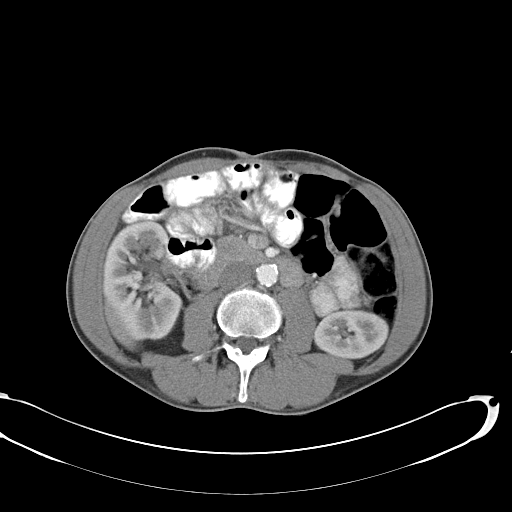
[im 70/133  soft-tissue]
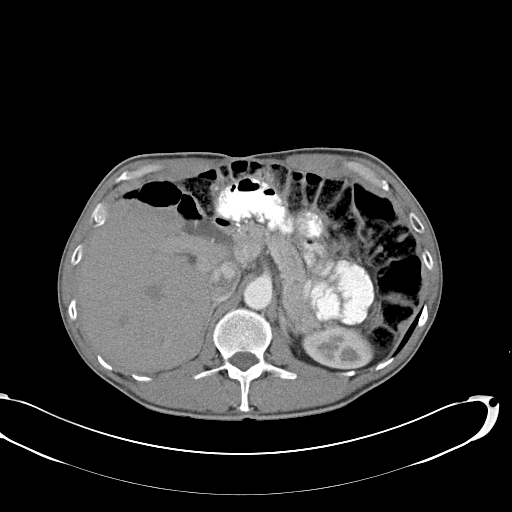
[im 78/133  soft-tissue]
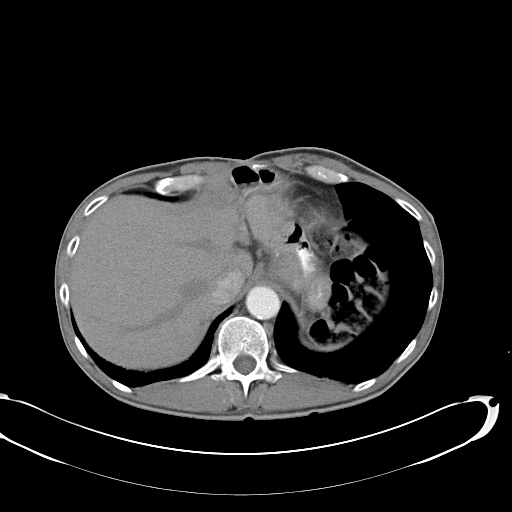
[im 86/133  soft-tissue]
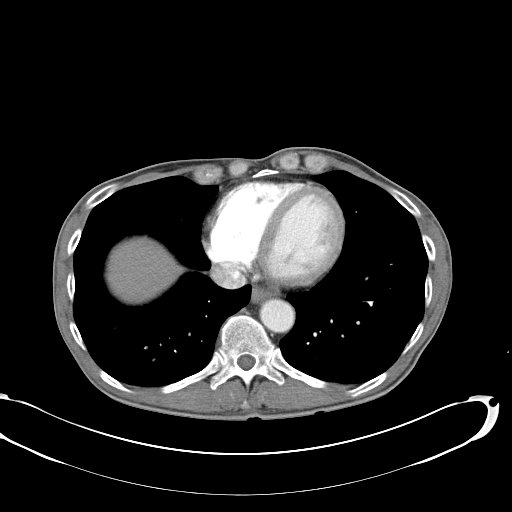
[im 86/133  bone]
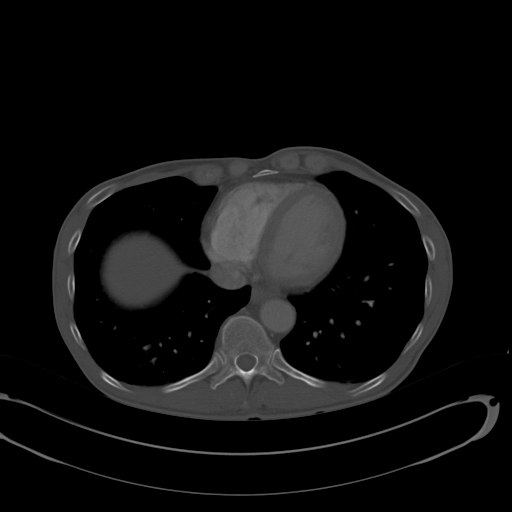
[im 94/133  soft-tissue]
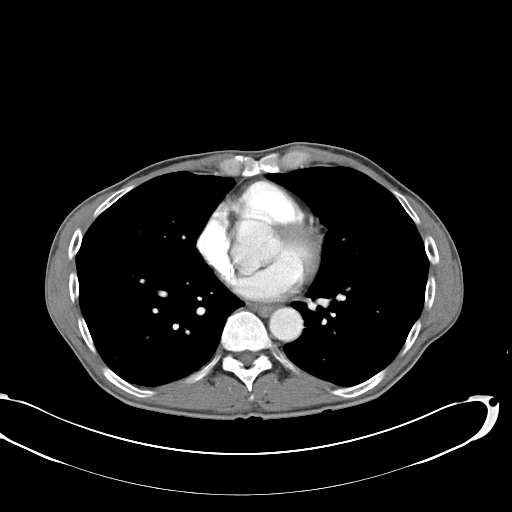
[im 101/133  soft-tissue]
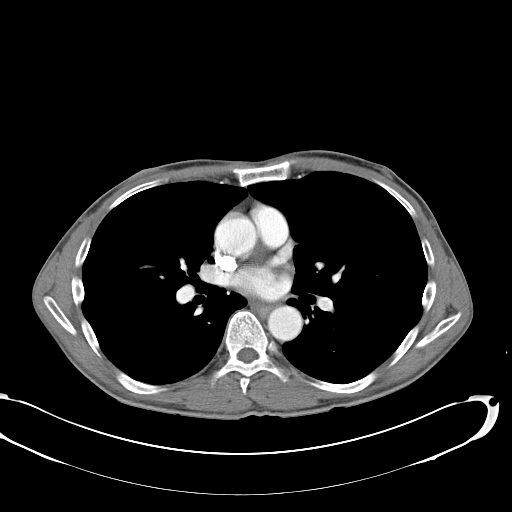
[im 117/133  soft-tissue]
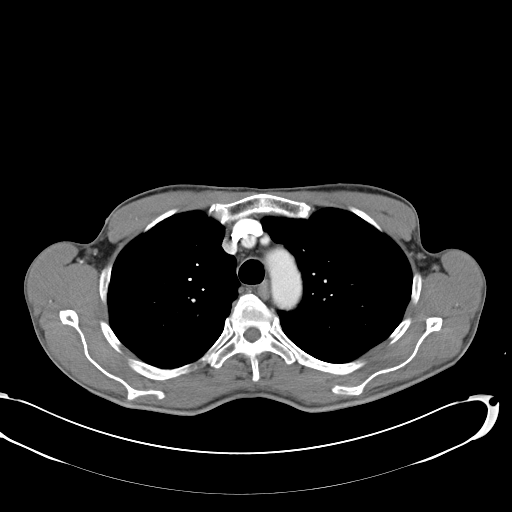
[im 125/133  soft-tissue]
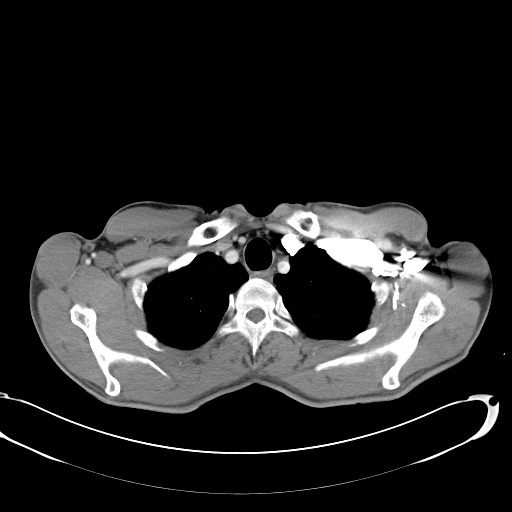

[Series 602: cor · coronal · 1.29mm/px · 3 of 73 slices shown]
[im 25/73  soft-tissue]
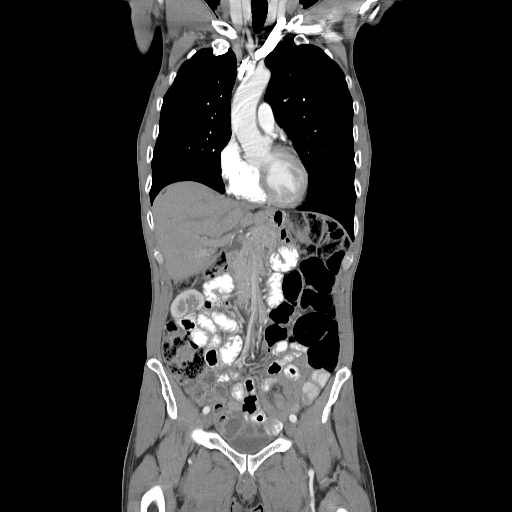
[im 33/73  soft-tissue]
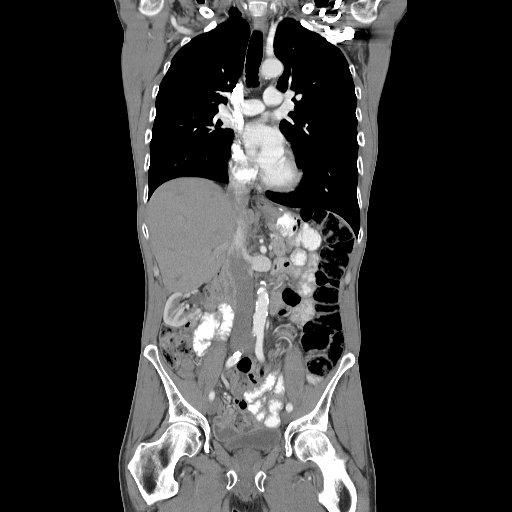
[im 41/73  soft-tissue]
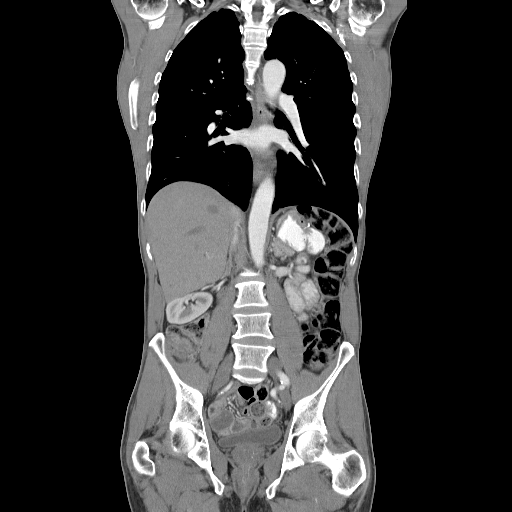

[16 of 46 positions shown; findings below may reference images not displayed]

FINDINGS: CT CHEST FINDINGS

No suspicious pulmonary nodules. Paraseptal emphysematous changes.
No pleural effusion or pneumothorax.

Visualized thyroid is unremarkable.

Heart is normal in size. No pericardial effusion. Mild
atherosclerotic calcifications of the aortic arch.

No suspicious mediastinal, hilar, or axillary lymphadenopathy.

Mild degenerative changes of the thoracic spine.

CT ABDOMEN AND PELVIS FINDINGS

Status post subtotal gastrectomy with gastrojejunostomy.

9 mm probable hemangioma in the right hepatic dome (series 2/image
50).

Prior splenectomy.

Pancreas is within normal limits. Tiny lesion at the junction of the
pancreatic head and body noted on the prior study is not currently
evident. Adrenal glands are within normal limits.

Status post cholecystectomy. No intrahepatic or extrahepatic ductal
dilatation.

Right kidney is malrotated. Postsurgical changes anteriorly related
to partial nephrectomy. Left kidney is within normal limits. No
hydronephrosis.

Visualized bowel is unremarkable. No evidence of bowel obstruction.

Atherosclerotic calcifications of the abdominal aorta and branch
vessels.

No abdominopelvic ascites.

No suspicious abdominopelvic lymphadenopathy.

Prostate is unremarkable.

Bladder is within normal limits.

Very mild degenerative changes of the lower lumbar spine.
IMPRESSION: Status post subtotal gastrectomy with gastrojejunostomy.

Prior splenectomy, cholecystectomy, and partial right nephrectomy.

No findings to suggest recurrent or metastatic disease in the chest,
abdomen or pelvis.

## 2014-02-15 ENCOUNTER — Encounter: Payer: Self-pay | Admitting: *Deleted

## 2014-02-15 NOTE — Progress Notes (Signed)
RECEIVED A FAX FROM [facility] CONCERNING A CONFIRMATION OF PRESCRIPTION SHIPMENT FOR GLEEVEC ON 02/12/14.

## 2014-02-18 ENCOUNTER — Other Ambulatory Visit: Payer: Self-pay | Admitting: Oncology

## 2014-02-18 DIAGNOSIS — C49A0 Gastrointestinal stromal tumor, unspecified site: Secondary | ICD-10-CM

## 2014-02-18 DIAGNOSIS — C649 Malignant neoplasm of unspecified kidney, except renal pelvis: Secondary | ICD-10-CM

## 2014-02-18 DIAGNOSIS — C50422 Malignant neoplasm of upper-outer quadrant of left male breast: Secondary | ICD-10-CM

## 2014-02-18 DIAGNOSIS — D500 Iron deficiency anemia secondary to blood loss (chronic): Secondary | ICD-10-CM

## 2014-02-18 NOTE — Progress Notes (Signed)
Medical Oncology  Patient not seen by MD 02-08-14, did have B12 injection that day, labs resulted with hemoglobin lower. RN not able to reach him by phone re lab results; no contact documented in EMR since then. POF sent to scheduler now for MD visit + lab + B12 ~ week of May 18.  Godfrey Pick, MD

## 2014-02-19 ENCOUNTER — Telehealth: Payer: Self-pay

## 2014-02-19 NOTE — Telephone Encounter (Signed)
Reached Henry [name] and discussed results of labs as noted below.  [name] stated that he has not be able to afford red meat lately. Denies bleeding, SOB, or weakness. Scheduled follow up appointment with DrLivesay and B12 injection for May 14th at 1400.  [name] verbalizes understanding.

## 2014-02-19 NOTE — Telephone Encounter (Signed)
Message copied by Baruch Merl on Fri Feb 19, 2014  2:21 PM ------      Message from: Evlyn Clines P      Created: Mon Feb 08, 2014  3:52 PM       Labs seen and need follow up: hemoglobin is lower today -- is he symptomatic, has he seen any bleeding? Please be sure he gets rescheduled to me since I did not see him today ------

## 2014-02-22 ENCOUNTER — Ambulatory Visit: Payer: BC Managed Care – PPO

## 2014-03-02 ENCOUNTER — Other Ambulatory Visit: Payer: Self-pay | Admitting: Oncology

## 2014-03-04 ENCOUNTER — Other Ambulatory Visit: Payer: Self-pay | Admitting: Oncology

## 2014-03-04 ENCOUNTER — Ambulatory Visit: Payer: 59

## 2014-03-04 ENCOUNTER — Ambulatory Visit: Payer: 59 | Admitting: Oncology

## 2014-03-04 ENCOUNTER — Other Ambulatory Visit: Payer: 59

## 2014-03-04 DIAGNOSIS — E538 Deficiency of other specified B group vitamins: Secondary | ICD-10-CM

## 2014-03-04 MED ORDER — CYANOCOBALAMIN 1000 MCG/ML IJ SOLN: 1000.0000 ug | Freq: Once | INTRAMUSCULAR | Status: DC

## 2014-03-31 ENCOUNTER — Telehealth: Payer: Self-pay

## 2014-03-31 DIAGNOSIS — C50422 Malignant neoplasm of upper-outer quadrant of left male breast: Secondary | ICD-10-CM

## 2014-03-31 DIAGNOSIS — C50929 Malignant neoplasm of unspecified site of unspecified male breast: Secondary | ICD-10-CM

## 2014-03-31 MED ORDER — TAMOXIFEN CITRATE 10 MG PO TABS: 10.0000 mg | ORAL_TABLET | Freq: Two times a day (BID) | ORAL | Status: DC

## 2014-03-31 NOTE — Telephone Encounter (Signed)
Rescheduled [name] to 04-21-14 as his travel schedule only alows for this day for an appointment and he is coming from [place].Sent a prescription for a month supply of tamoxifen to [facility].

## 2014-04-12 ENCOUNTER — Telehealth: Payer: Self-pay

## 2014-04-12 NOTE — Telephone Encounter (Signed)
Told [name] the Dr. Marko Plume said that if the lesion keeps bleeding or other problems prior to her visit on 04-21-14, he could go to an urgnet care or dermatologist if he knows one already other wise She can look at the area at his  Visit.  Patient verbalized understanding.  He may try to get an appointment with a dermatologist prior to 04-21-14 visit.

## 2014-04-12 NOTE — Telephone Encounter (Signed)
Henry [name] called stating that he has a mole on his right collar bone that has increased in size.  It is irritated, scratchy, bled a day a go, and now the edges are irregular and black. Does he need to take care of this prior to hs visit 04-21-14? NOTE: He is trying to get his insurance reinstated.  This may take 1-2 weeks.

## 2014-04-12 NOTE — Telephone Encounter (Signed)
          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 the Dr. Marko Plume said that if the lesion keeps bleeding or other problems prior to her visit on 04-21-14, he could go to an urgnet care or dermatologist if he knows one already other wise She can look at the area at his Visit. Patient verbalized understanding. He may try to get an appointment with a dermatologist prior to 04-21-14 visit.        Baruch Merl, RN at 03/31/2014 5:53 PM     Status: Signed        Rescheduled [name] to 04-21-14 as his travel schedule only alows for this day for an appointment and he is coming from [place].Sent a prescription for a month supply of tamoxifen to [facility].                 Encounter MyChart Messages     No messages in this encounter             Created by     Baruch Merl, RN on 03/31/2014 05:52 PM             Approved      Disp Refills Start End    tamoxifen (NOLVADEX) 10 MG tablet 60 tablet 0 03/31/2014     Sig - Route: Take 1 tablet (10 mg total) by mouth 2 (two) times daily. - Oral    Class: Normal    Authorizing Provider: Gordy Levan, MD    Ordering User: Baruch Merl, RN                             Visit Pharmacy     [id number] [place], [place] - [phone] [place]             Contacts       Type Contact Phone    03/31/2014 5:52 PM Phone  (Outgoing) Henry [name], Henry [name] (Self) [phone] (H)    Completed

## 2014-04-13 ENCOUNTER — Telehealth: Payer: Self-pay | Admitting: *Deleted

## 2014-04-13 NOTE — Telephone Encounter (Signed)
Called to request for Dr. Marko Plume to send him to [facility] : phone [phone] Michela Pitcher they will not accept self-referral, needs to come from [place].

## 2014-04-14 ENCOUNTER — Other Ambulatory Visit: Payer: Self-pay | Admitting: Oncology

## 2014-04-14 DIAGNOSIS — D225 Melanocytic nevi of trunk: Secondary | ICD-10-CM

## 2014-04-19 ENCOUNTER — Other Ambulatory Visit: Payer: Self-pay | Admitting: Oncology

## 2014-04-21 ENCOUNTER — Telehealth: Payer: Self-pay | Admitting: Oncology

## 2014-04-21 ENCOUNTER — Other Ambulatory Visit (HOSPITAL_BASED_OUTPATIENT_CLINIC_OR_DEPARTMENT_OTHER): Payer: Self-pay

## 2014-04-21 ENCOUNTER — Ambulatory Visit (HOSPITAL_BASED_OUTPATIENT_CLINIC_OR_DEPARTMENT_OTHER): Payer: Self-pay | Admitting: Oncology

## 2014-04-21 ENCOUNTER — Ambulatory Visit: Payer: 59

## 2014-04-21 ENCOUNTER — Encounter: Payer: Self-pay | Admitting: Oncology

## 2014-04-21 ENCOUNTER — Other Ambulatory Visit: Payer: Self-pay | Admitting: *Deleted

## 2014-04-21 VITALS — BP 136/81 | HR 74 | Temp 97.4°F | Resp 18 | Ht 72.0 in | Wt 132.2 lb

## 2014-04-21 DIAGNOSIS — L989 Disorder of the skin and subcutaneous tissue, unspecified: Secondary | ICD-10-CM

## 2014-04-21 DIAGNOSIS — C50929 Malignant neoplasm of unspecified site of unspecified male breast: Secondary | ICD-10-CM

## 2014-04-21 DIAGNOSIS — C50422 Malignant neoplasm of upper-outer quadrant of left male breast: Secondary | ICD-10-CM

## 2014-04-21 DIAGNOSIS — D500 Iron deficiency anemia secondary to blood loss (chronic): Secondary | ICD-10-CM

## 2014-04-21 DIAGNOSIS — E538 Deficiency of other specified B group vitamins: Secondary | ICD-10-CM

## 2014-04-21 DIAGNOSIS — C49A0 Gastrointestinal stromal tumor, unspecified site: Secondary | ICD-10-CM

## 2014-04-21 DIAGNOSIS — D649 Anemia, unspecified: Secondary | ICD-10-CM

## 2014-04-21 DIAGNOSIS — C169 Malignant neoplasm of stomach, unspecified: Secondary | ICD-10-CM

## 2014-04-21 DIAGNOSIS — C494 Malignant neoplasm of connective and soft tissue of abdomen: Secondary | ICD-10-CM

## 2014-04-21 DIAGNOSIS — Z8553 Personal history of malignant neoplasm of renal pelvis: Secondary | ICD-10-CM

## 2014-04-21 DIAGNOSIS — C649 Malignant neoplasm of unspecified kidney, except renal pelvis: Secondary | ICD-10-CM

## 2014-04-21 LAB — COMPREHENSIVE METABOLIC PANEL (CC13)
ALT: 14 U/L (ref 0–55)
ANION GAP: 10 meq/L (ref 3–11)
AST: 19 U/L (ref 5–34)
Albumin: 3.6 g/dL (ref 3.5–5.0)
Alkaline Phosphatase: 34 U/L — ABNORMAL LOW (ref 40–150)
BUN: 10.1 mg/dL (ref 7.0–26.0)
CHLORIDE: 107 meq/L (ref 98–109)
CO2: 27 meq/L (ref 22–29)
Calcium: 8.7 mg/dL (ref 8.4–10.4)
Creatinine: 1 mg/dL (ref 0.7–1.3)
Glucose: 107 mg/dl (ref 70–140)
POTASSIUM: 3.8 meq/L (ref 3.5–5.1)
SODIUM: 144 meq/L (ref 136–145)
TOTAL PROTEIN: 6.7 g/dL (ref 6.4–8.3)
Total Bilirubin: 1.31 mg/dL — ABNORMAL HIGH (ref 0.20–1.20)

## 2014-04-21 LAB — CBC WITH DIFFERENTIAL/PLATELET
BASO%: 1.3 % (ref 0.0–2.0)
Basophils Absolute: 0.1 10*3/uL (ref 0.0–0.1)
EOS%: 2.8 % (ref 0.0–7.0)
Eosinophils Absolute: 0.1 10*3/uL (ref 0.0–0.5)
HCT: 35.2 % — ABNORMAL LOW (ref 38.4–49.9)
HGB: 11.1 g/dL — ABNORMAL LOW (ref 13.0–17.1)
LYMPH#: 1.7 10*3/uL (ref 0.9–3.3)
LYMPH%: 35.8 % (ref 14.0–49.0)
MCH: 26.2 pg — ABNORMAL LOW (ref 27.2–33.4)
MCHC: 31.5 g/dL — AB (ref 32.0–36.0)
MCV: 83 fL (ref 79.3–98.0)
MONO#: 0.6 10*3/uL (ref 0.1–0.9)
MONO%: 12.1 % (ref 0.0–14.0)
NEUT#: 2.3 10*3/uL (ref 1.5–6.5)
NEUT%: 48 % (ref 39.0–75.0)
Platelets: 320 10*3/uL (ref 140–400)
RBC: 4.24 10*6/uL (ref 4.20–5.82)
RDW: 15.4 % — AB (ref 11.0–14.6)
WBC: 4.7 10*3/uL (ref 4.0–10.3)

## 2014-04-21 MED ORDER — CYANOCOBALAMIN 1000 MCG/ML IJ SOLN
1000.0000 ug | Freq: Once | INTRAMUSCULAR | Status: AC
  Administered 2014-04-21: 1000 ug via INTRAMUSCULAR

## 2014-04-21 MED ORDER — IMATINIB MESYLATE 100 MG PO TABS: 100.0000 mg | ORAL_TABLET | Freq: Every day | ORAL | Status: DC

## 2014-04-21 MED ORDER — CYANOCOBALAMIN 1000 MCG/ML IJ SOLN
INTRAMUSCULAR | Status: AC
  Filled 2014-04-21: qty 1

## 2014-04-21 NOTE — Patient Instructions (Signed)
Dr Nena Polio  Thurs July 2 arrive 0920 for 0930 appointment Phone [phone]

## 2014-04-21 NOTE — Progress Notes (Signed)
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 He has missed appointments and had some difficulty obtaining both tamoxifen and gleevec due to insurance issues, but has these now. Last B12 injection was in April.  His main concern today is of change in skin lesion in right supraclavicular area in past couple of weeks. This was a preexisting skin lesion, which enlarged and had dark areas after he was at beach. He has used peroxide, topical ASA, neosporin etc to the area, which seems to have decreased in size somewhat; the area is not painful, bleeding or draining. There was no known trauma. Otherwise he has felt well and has tolerated the tamoxifen with no difficulty. Numbness in upper inner left arm is gradually improving. He has no discomfort across chest wall and no noted changes at lumpectomy scar or otherwise, and no swelling LUE. He has no abdominal discomfort, appetite and po intake at baseline, bowels moving normally, energy good.    ONCOLOGIC HISTORY Patient had been aware of palpable mass adjacent to left areola for possibly 2-3 months, which had not changed that he could tell during that time. He had bilateral mammograms and left US done at [facility] on 08-28-13, then excisional biopsy by Dr Brantley Stage on 09-23-13. 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 He had re-excision with evaluation of sentinel nodes on 10-14-13  ([id number]), with no residual malignancy in the specimen and 6 sentinel  nodes negative. He has had no post operative complications, tho still slight numbness in left axilla. At [facility] extent of surgery was felt to be adequate and radiation oncology did not recommend additional radiation therapy; he is to see genetics counselor 12-28-2013 and understands that recommendations for additional surgery (bilateral mastectomies) may be considered depending on findings with genetics testing. He is in agreement with continuing tamoxifen now.   GIST was initially disgnosed 2007, with subtotal gastrectomy with Roux-en-Y, then briefly on study with [place] which he did not tolerate due to marked elevations in LFTs. He had recurrent GIST in late 2012 with further surgery by Dr.Levine March 2011, then rapid elevation of LFTs with 50% dosing (200 mg/d) gleevec, with gleevec again discontinued after short period in 2011. He had further progression in June 2012, did tolerate gleevec at 135m/d with initial prednisone, with adequate shrinkage of tumor to allow surgery by Dr. LClovis RileyDec 6, 2012. At laparotomy he had extensive adhesions but no gross peritoneal, hepatic or bowel involvement. The mass itself was cystic and soft and not densely adherent to any visceral structures. Pathology([facility] [id number] recurrent GIST 8.9 cm with resection margins not involved and comment that microscopically this exhibits an epithelial and glandular growth pattern which differs from previous specimens; nuclei medium-sized and bland; immunohistochemical panel postive for c-kit. No mitotic index is reported. He saw Dr.Levine last in Dec 2012. [facility]; last PET Jan 2011.   Mr.Briley has needed B12 injections due to gastric/ bowel resection. He was iron deficient related to surgeries and blood  draws, previously had marked skin reaction to feraheme, but did tolerate inferon on 12-10-2011 with no difficulty.   He has history of T1a renal cell carcinoma, treated with partial right  nephrectomy in 2008 and not active since then.  Review of systems as above, also: No pain. No urinary symptoms. No SOB or respiratory symptoms. Remainder of 10 point Review of Systems negative.  Objective:  Vital signs in last 24 hours:  BP 136/81  Pulse 74  Temp(Src) 97.4 F (36.3 C) (Oral)  Resp 18  Ht 6' (1.829 m)  Wt 132 lb 3.2 oz (59.966 kg)  BMI 17.93 kg/m2 weight is down 3 lbs.  Alert, oriented and appropriate. Quickly and easily ambulatory.   HEENT:PERRL, sclerae not icteric. Oral mucosa moist without lesions, posterior pharynx clear.  Neck supple. No JVD.  Lymphatics:no cervical,suraclavicular, axillary adenopathy Resp: clear to auscultation bilaterally and normal percussion bilaterally Cardio: regular rate and rhythm. No gallop. GI: soft, nontender, not distended, no HSM or organomegaly. Normally active bowel sounds. Surgical incision not remarkable. Musculoskeletal/ Extremities: without pitting edema, cords, tenderness Neuro: decreased light touch inner upper left arm, otherwise grossly nonfocal Skin: fleshy soft slightly irregular nodular area ~5x7 mm mid right supraclavicular area, surrounding skin slightly erythematous apparently from the topicals he has tried.  Breasts: Left with unremarkable lumpectomy incision at inferior edge of areola, no dominant mass, no skin or nipple findings and left axilla not remarkable. Right without dominant mass, skin or nipple findings and axilla benign.  Lab Results:  Results for orders placed in visit on 04/21/14  CBC WITH DIFFERENTIAL      Result Value Ref Range   WBC 4.7  4.0 - 10.3 10e3/uL   NEUT# 2.3  1.5 - 6.5 10e3/uL   HGB 11.1 (*) 13.0 - 17.1 g/dL   HCT 35.2 (*) 38.4 - 49.9 %   Platelets 320  140 - 400 10e3/uL   MCV 83.0  79.3 - 98.0 fL   MCH 26.2 (*) 27.2 - 33.4 pg   MCHC 31.5 (*) 32.0 - 36.0 g/dL   RBC 4.24  4.20 - 5.82 10e6/uL   RDW 15.4 (*) 11.0 - 14.6 %   lymph# 1.7  0.9 - 3.3 10e3/uL   MONO# 0.6  0.1 - 0.9  10e3/uL   Eosinophils Absolute 0.1  0.0 - 0.5 10e3/uL   Basophils Absolute 0.1  0.0 - 0.1 10e3/uL   NEUT% 48.0  39.0 - 75.0 %   LYMPH% 35.8  14.0 - 49.0 %   MONO% 12.1  0.0 - 14.0 %   EOS% 2.8  0.0 - 7.0 %   BASO% 1.3  0.0 - 2.0 %  COMPREHENSIVE METABOLIC PANEL (UU82)      Result Value Ref Range   Sodium 144  136 - 145 mEq/L   Potassium 3.8  3.5 - 5.1 mEq/L   Chloride 107  98 - 109 mEq/L   CO2 27  22 - 29 mEq/L   Glucose 107  70 - 140 mg/dl   BUN 10.1  7.0 - 26.0 mg/dL   Creatinine 1.0  0.7 - 1.3 mg/dL   Total Bilirubin 1.31 (*) 0.20 - 1.20 mg/dL   Alkaline Phosphatase 34 (*) 40 - 150 U/L   AST 19  5 - 34 U/L   ALT 14  0 - 55 U/L   Total Protein 6.7  6.4 - 8.3 g/dL   Albumin 3.6  3.5 - 5.0 g/dL   Calcium 8.7  8.4 - 10.4 mg/dL  Anion Gap 10  3 - 11 mEq/L     Studies/Results:  No results found.  Medications: I have reviewed the patient's current medications. B12 injection given today. Gleevec prescription to [facility]. Continue Tamoxifen 10 mg bid begun 10-20-13, this dosing due to potential for interaction with gleevec  DISCUSSION: Patient is concerned about the skin lesion. Earliest appointment at [facility] (nearer to his present residence than [place]) is Aug 1. I have spoken with Dr Ledell Peoples office, which has cancellation for tomorrow AM and has kindly agreed to see him then.   Assessment/Plan:  1. 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 Continue tamoxifen. I will see him back in 4 months or sooner if needed. 2.GIST: initially diagnosed 2007 with subtotal gastrectomy with Roux-en-Y, then briefly on study with [place] which he did not tolerate due to marked elevations in LFTs. Recurrent GIST with surgery by Dr Clovis Riley March 2011, then rapid elevation of LFTs with 50% dose reduction of Gleevec. Further  progression June 2012, tolerated gleevec at 100 mg daily with initial steroids, further surgery Dec 2012. Continuing gleevec at 100 mg daily  3.T1a renal cell carcinoma treated with right partial nephrectomy 2008.  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 MCV gradually decreasing, follow. 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 Insurance concerns ongoing.  10.reported hx manic depressive illness, tho I have never been clear about that diagnosis   Patient is comfortable with discussion and in agreement with plan.  LIVESAY,LENNIS P, MD   04/21/2014, 4:45 PM

## 2014-04-21 NOTE — Telephone Encounter (Signed)
perpof to sch appt-inj-gave pt copy of sch-pt aware of appt on 7/2 w/Dr Allyson Sabal

## 2014-04-22 ENCOUNTER — Telehealth: Payer: Self-pay | Admitting: *Deleted

## 2014-04-22 LAB — IRON AND TIBC CHCC
%SAT: 8 % — AB (ref 20–55)
Iron: 39 ug/dL — ABNORMAL LOW (ref 42–163)
TIBC: 464 ug/dL — ABNORMAL HIGH (ref 202–409)
UIBC: 425 ug/dL — ABNORMAL HIGH (ref 117–376)

## 2014-04-22 LAB — FERRITIN CHCC: Ferritin: 8 ng/ml — ABNORMAL LOW (ref 22–316)

## 2014-04-22 NOTE — Telephone Encounter (Signed)
Office notes faxed to Dr Allyson Sabal for appointment today

## 2014-05-12 ENCOUNTER — Encounter: Payer: Self-pay | Admitting: Oncology

## 2014-05-12 NOTE — Progress Notes (Signed)
[facility] states the patient does not have insurance; he states that he is trying to figure out what happened to it, but can't afford the gleevec. I will apply for patient assistance when he brings me his proof of income.

## 2014-05-17 ENCOUNTER — Telehealth: Payer: Self-pay

## 2014-05-17 NOTE — Telephone Encounter (Signed)
Message copied by Baruch Merl on Mon May 17, 2014 11:48 AM ------      Message from: Gordy Levan      Created: Sat May 15, 2014  6:18 PM       Labs seen and need follow up: he is for B12 injection on 7-29. If possible, please let him know then that iron level is in low range again, which is not unexpected since last IV iron was ~ 2 years ago. (He does not absorb oral iron due to GI resections, had reaction to feraheme but tolerated inferon). His hemoglobin is still holding ok and he was not symptomatic when I saw him last. We can set up IV iron maybe with next B12 after this one, or at my next visit, or I can discuss with him at my next visit.   thanks ------

## 2014-05-17 NOTE — Telephone Encounter (Signed)
Need orders for IV Infed and B12 injection for 06-11-14.

## 2014-05-17 NOTE — Telephone Encounter (Signed)
Spoke with Mr Cybulski regarding the Iron levels as noted below by Dr. Marko Plume. He is fine with receiving IV Infed and B12 injection on Friday August 21 @ 1000.  Cancelled 06-16-14 [place]

## 2014-05-19 ENCOUNTER — Ambulatory Visit: Payer: Self-pay

## 2014-05-24 ENCOUNTER — Other Ambulatory Visit: Payer: Self-pay | Admitting: Oncology

## 2014-05-24 DIAGNOSIS — D500 Iron deficiency anemia secondary to blood loss (chronic): Secondary | ICD-10-CM

## 2014-05-24 MED ORDER — ACETAMINOPHEN 325 MG PO TABS: 325.0000 mg | ORAL_TABLET | Freq: Once | ORAL | Status: DC

## 2014-05-24 MED ORDER — DIPHENHYDRAMINE HCL 25 MG PO TABS: 50.0000 mg | ORAL_TABLET | Freq: Once | ORAL | Status: DC

## 2014-05-31 ENCOUNTER — Ambulatory Visit (HOSPITAL_BASED_OUTPATIENT_CLINIC_OR_DEPARTMENT_OTHER): Payer: BC Managed Care – PPO

## 2014-05-31 ENCOUNTER — Telehealth: Payer: Self-pay | Admitting: Oncology

## 2014-05-31 VITALS — BP 134/78 | HR 84 | Temp 97.6°F

## 2014-05-31 DIAGNOSIS — C49A0 Gastrointestinal stromal tumor, unspecified site: Secondary | ICD-10-CM

## 2014-05-31 DIAGNOSIS — E538 Deficiency of other specified B group vitamins: Secondary | ICD-10-CM

## 2014-05-31 MED ORDER — CYANOCOBALAMIN 1000 MCG/ML IJ SOLN
1000.0000 ug | Freq: Once | INTRAMUSCULAR | Status: AC
  Administered 2014-05-31: 1000 ug via INTRAMUSCULAR

## 2014-06-08 ENCOUNTER — Telehealth: Payer: Self-pay | Admitting: *Deleted

## 2014-06-08 NOTE — Telephone Encounter (Signed)
Received completed and signed Annual Follow-up form from Dr. Etter Sjogren.  Form mailed to [facility].//AB/CMA

## 2014-06-11 ENCOUNTER — Ambulatory Visit (HOSPITAL_BASED_OUTPATIENT_CLINIC_OR_DEPARTMENT_OTHER): Payer: Self-pay

## 2014-06-11 ENCOUNTER — Other Ambulatory Visit: Payer: Self-pay | Admitting: *Deleted

## 2014-06-11 VITALS — BP 164/82 | HR 64 | Temp 97.5°F

## 2014-06-11 DIAGNOSIS — D509 Iron deficiency anemia, unspecified: Secondary | ICD-10-CM

## 2014-06-11 DIAGNOSIS — E538 Deficiency of other specified B group vitamins: Secondary | ICD-10-CM

## 2014-06-11 MED ORDER — ACETAMINOPHEN 325 MG PO TABS
ORAL_TABLET | ORAL | Status: AC
  Filled 2014-06-11: qty 1

## 2014-06-11 MED ORDER — SODIUM CHLORIDE 0.9 % IV SOLN
1000.0000 mg | Freq: Once | INTRAVENOUS | Status: AC
  Administered 2014-06-11: 1000 mg via INTRAVENOUS
  Filled 2014-06-11: qty 20

## 2014-06-11 MED ORDER — SODIUM CHLORIDE 0.9 % IV SOLN
50.0000 mg | Freq: Once | INTRAVENOUS | Status: AC
  Administered 2014-06-11: 50 mg via INTRAVENOUS
  Filled 2014-06-11: qty 1

## 2014-06-11 MED ORDER — DIPHENHYDRAMINE HCL 25 MG PO CAPS
50.0000 mg | ORAL_CAPSULE | Freq: Once | ORAL | Status: AC
  Administered 2014-06-11: 50 mg via ORAL

## 2014-06-11 MED ORDER — ACETAMINOPHEN 325 MG PO TABS
325.0000 mg | ORAL_TABLET | Freq: Once | ORAL | Status: AC
  Administered 2014-06-11: 325 mg via ORAL

## 2014-06-11 NOTE — Patient Instructions (Signed)
\[id number]\\[id number]\ Iron Dextran injection What is this medicine? IRON DEXTRAN (AHY ern DEX tran) is an iron complex. Iron is used to make healthy red blood cells, which carry oxygen and nutrients through the body. This medicine is used to treat people who cannot take iron by mouth and have low levels of iron in the blood. This medicine may be used for other purposes; ask your health care provider or pharmacist if you have questions. COMMON BRAND NAME(S): Dexferrum, INFeD What should I tell my health care provider before I take this medicine? They need to know if you have any of these conditions: -anemia not caused by low iron levels -heart disease -high levels of iron in the blood -kidney disease -liver disease -an unusual or allergic reaction to iron, other medicines, foods, dyes, or preservatives -pregnant or trying to get pregnant -breast-feeding How should I use this medicine? This medicine is for injection into a vein or a muscle. It is given by a health care professional in a hospital or clinic setting. Talk to your pediatrician regarding the use of this medicine in children. While this drug may be prescribed for children as young as 22 months old for selected conditions, precautions do apply. Overdosage: If you think you have taken too much of this medicine contact a poison control center or emergency room at once. NOTE: This medicine is only for you. Do not share this medicine with others. What if I miss a dose? It is important not to miss your dose. Call your doctor or health care professional if you are unable to keep an appointment. What may interact with this medicine? Do not take this medicine with any of the following medications: -deferoxamine -dimercaprol -other iron products This medicine may also interact with the following medications: -chloramphenicol -deferasirox This list may not describe all possible interactions. Give your health care provider a list  of all the medicines, herbs, non-prescription drugs, or dietary supplements you use. Also tell them if you smoke, drink alcohol, or use illegal drugs. Some items may interact with your medicine. What should I watch for while using this medicine? Visit your doctor or health care professional regularly. Tell your doctor if your symptoms do not start to get better or if they get worse. You may need blood work done while you are taking this medicine. You may need to follow a special diet. Talk to your doctor. Foods that contain iron include: whole grains/cereals, dried fruits, beans, or peas, leafy green vegetables, and organ meats (liver, kidney). Long-term use of this medicine may increase your risk of some cancers. Talk to your doctor about how to limit your risk. What side effects may I notice from receiving this medicine? Side effects that you should report to your doctor or health care professional as soon as possible: -allergic reactions like skin rash, itching or hives, swelling of the face, lips, or tongue -blue lips, nails, or skin -breathing problems -changes in blood pressure -chest pain -confusion -fast, irregular heartbeat -feeling faint or lightheaded, falls -fever or chills -flushing, sweating, or hot feelings -joint or muscle aches or pains -pain, tingling, numbness in the hands or feet -seizures -unusually weak or tired Side effects that usually do not require medical attention (report to your doctor or health care professional if they continue or are bothersome): -change in taste (metallic taste) -diarrhea -headache -irritation at site where injected -nausea, vomiting -stomach upset This list may not describe all possible side effects. Call your doctor for medical advice about side  effects. You may report side effects to FDA at 1-800-FDA-1088. Where should I keep my medicine? This drug is given in a hospital or clinic and will not be stored at home. NOTE: This sheet is a  summary. It may not cover all possible information. If you have questions about this medicine, talk to your doctor, pharmacist, or health care provider.  2015, [employer] Standard. (2008-02-24 16:59:50)

## 2014-06-15 ENCOUNTER — Encounter: Payer: Self-pay | Admitting: Oncology

## 2014-06-15 NOTE — Progress Notes (Signed)
[employer] approved free gleevec from 06/06/14-06/07/15

## 2014-06-16 ENCOUNTER — Ambulatory Visit: Payer: Self-pay

## 2014-06-29 ENCOUNTER — Ambulatory Visit: Payer: Self-pay

## 2014-07-04 NOTE — Progress Notes (Signed)
This was not a visit.

## 2014-07-14 ENCOUNTER — Ambulatory Visit: Payer: Self-pay

## 2014-07-21 ENCOUNTER — Ambulatory Visit: Payer: Self-pay | Admitting: Oncology

## 2014-07-21 ENCOUNTER — Other Ambulatory Visit: Payer: Self-pay

## 2014-07-21 ENCOUNTER — Ambulatory Visit: Payer: Self-pay

## 2014-07-26 ENCOUNTER — Ambulatory Visit (HOSPITAL_BASED_OUTPATIENT_CLINIC_OR_DEPARTMENT_OTHER): Payer: Self-pay

## 2014-07-26 VITALS — BP 117/71 | HR 91 | Temp 98.1°F

## 2014-07-26 DIAGNOSIS — E538 Deficiency of other specified B group vitamins: Secondary | ICD-10-CM

## 2014-07-26 DIAGNOSIS — C49A0 Gastrointestinal stromal tumor, unspecified site: Secondary | ICD-10-CM

## 2014-07-26 DIAGNOSIS — Z23 Encounter for immunization: Secondary | ICD-10-CM

## 2014-07-26 MED ORDER — INFLUENZA VAC SPLIT QUAD 0.5 ML IM SUSY
0.5000 mL | PREFILLED_SYRINGE | Freq: Once | INTRAMUSCULAR | Status: AC
  Administered 2014-07-26: 0.5 mL via INTRAMUSCULAR
  Filled 2014-07-26: qty 0.5

## 2014-07-26 MED ORDER — CYANOCOBALAMIN 1000 MCG/ML IJ SOLN
1000.0000 ug | Freq: Once | INTRAMUSCULAR | Status: AC
  Administered 2014-07-26: 1000 ug via INTRAMUSCULAR

## 2014-07-29 ENCOUNTER — Other Ambulatory Visit: Payer: Self-pay | Admitting: *Deleted

## 2014-07-29 DIAGNOSIS — C50422 Malignant neoplasm of upper-outer quadrant of left male breast: Secondary | ICD-10-CM

## 2014-07-29 MED ORDER — TAMOXIFEN CITRATE 10 MG PO TABS: 10.0000 mg | ORAL_TABLET | Freq: Two times a day (BID) | ORAL | Status: DC

## 2014-07-29 MED ORDER — TAMOXIFEN CITRATE 10 MG PO TABS
10.0000 mg | ORAL_TABLET | Freq: Two times a day (BID) | ORAL | Status: DC
Start: 2014-07-29 — End: 2014-07-29

## 2014-08-10 ENCOUNTER — Other Ambulatory Visit: Payer: Self-pay | Admitting: Oncology

## 2014-08-10 DIAGNOSIS — C50422 Malignant neoplasm of upper-outer quadrant of left male breast: Secondary | ICD-10-CM

## 2014-08-10 DIAGNOSIS — C49A0 Gastrointestinal stromal tumor, unspecified site: Secondary | ICD-10-CM

## 2014-08-10 DIAGNOSIS — C641 Malignant neoplasm of right kidney, except renal pelvis: Secondary | ICD-10-CM

## 2014-08-11 ENCOUNTER — Ambulatory Visit: Payer: Self-pay

## 2014-08-11 ENCOUNTER — Telehealth: Payer: Self-pay | Admitting: Oncology

## 2014-08-11 ENCOUNTER — Ambulatory Visit (HOSPITAL_BASED_OUTPATIENT_CLINIC_OR_DEPARTMENT_OTHER): Payer: Self-pay | Admitting: Oncology

## 2014-08-11 ENCOUNTER — Other Ambulatory Visit (HOSPITAL_BASED_OUTPATIENT_CLINIC_OR_DEPARTMENT_OTHER): Payer: Self-pay

## 2014-08-11 ENCOUNTER — Encounter: Payer: Self-pay | Admitting: Oncology

## 2014-08-11 ENCOUNTER — Other Ambulatory Visit: Payer: Self-pay | Admitting: Oncology

## 2014-08-11 VITALS — BP 121/66 | HR 86 | Temp 98.8°F | Resp 18 | Ht <= 58 in | Wt 133.1 lb

## 2014-08-11 DIAGNOSIS — H539 Unspecified visual disturbance: Secondary | ICD-10-CM

## 2014-08-11 DIAGNOSIS — C49A0 Gastrointestinal stromal tumor, unspecified site: Secondary | ICD-10-CM

## 2014-08-11 DIAGNOSIS — Z85821 Personal history of Merkel cell carcinoma: Secondary | ICD-10-CM

## 2014-08-11 DIAGNOSIS — C50422 Malignant neoplasm of upper-outer quadrant of left male breast: Secondary | ICD-10-CM

## 2014-08-11 DIAGNOSIS — N63 Unspecified lump in unspecified breast: Secondary | ICD-10-CM

## 2014-08-11 DIAGNOSIS — C499 Malignant neoplasm of connective and soft tissue, unspecified: Secondary | ICD-10-CM

## 2014-08-11 DIAGNOSIS — C641 Malignant neoplasm of right kidney, except renal pelvis: Secondary | ICD-10-CM

## 2014-08-11 LAB — COMPREHENSIVE METABOLIC PANEL (CC13)
ALBUMIN: 3.5 g/dL (ref 3.5–5.0)
ALK PHOS: 31 U/L — AB (ref 40–150)
ALT: 22 U/L (ref 0–55)
ANION GAP: 9 meq/L (ref 3–11)
AST: 20 U/L (ref 5–34)
BUN: 14.2 mg/dL (ref 7.0–26.0)
CO2: 27 mEq/L (ref 22–29)
Calcium: 9.3 mg/dL (ref 8.4–10.4)
Chloride: 109 mEq/L (ref 98–109)
Creatinine: 1.1 mg/dL (ref 0.7–1.3)
Glucose: 124 mg/dl (ref 70–140)
POTASSIUM: 4.1 meq/L (ref 3.5–5.1)
Sodium: 145 mEq/L (ref 136–145)
TOTAL PROTEIN: 6.3 g/dL — AB (ref 6.4–8.3)
Total Bilirubin: 1.07 mg/dL (ref 0.20–1.20)

## 2014-08-11 LAB — CBC WITH DIFFERENTIAL/PLATELET
BASO%: 0.9 % (ref 0.0–2.0)
Basophils Absolute: 0.1 10*3/uL (ref 0.0–0.1)
EOS%: 1.5 % (ref 0.0–7.0)
Eosinophils Absolute: 0.1 10*3/uL (ref 0.0–0.5)
HCT: 40 % (ref 38.4–49.9)
HGB: 13 g/dL (ref 13.0–17.1)
LYMPH%: 41.3 % (ref 14.0–49.0)
MCH: 28.9 pg (ref 27.2–33.4)
MCHC: 32.5 g/dL (ref 32.0–36.0)
MCV: 88.9 fL (ref 79.3–98.0)
MONO#: 0.7 10*3/uL (ref 0.1–0.9)
MONO%: 9 % (ref 0.0–14.0)
NEUT%: 47.3 % (ref 39.0–75.0)
NEUTROS ABS: 3.6 10*3/uL (ref 1.5–6.5)
PLATELETS: 228 10*3/uL (ref 140–400)
RBC: 4.5 10*6/uL (ref 4.20–5.82)
RDW: 18.9 % — ABNORMAL HIGH (ref 11.0–14.6)
WBC: 7.5 10*3/uL (ref 4.0–10.3)
lymph#: 3.1 10*3/uL (ref 0.9–3.3)

## 2014-08-11 LAB — URINALYSIS, MICROSCOPIC - CHCC
Bilirubin (Urine): NEGATIVE
Blood: NEGATIVE
Glucose: 250 mg/dL
Ketones: NEGATIVE mg/dL
LEUKOCYTE ESTERASE: NEGATIVE
Nitrite: NEGATIVE
PROTEIN: 30 mg/dL
Specific Gravity, Urine: 1.02 (ref 1.003–1.035)
Urobilinogen, UR: 0.2 mg/dL (ref 0.2–1)
pH: 6.5 (ref 4.6–8.0)

## 2014-08-11 NOTE — Telephone Encounter (Signed)
per pof to sch appt-sch pt mamma-opthamology appt-adv pt that CS will call w/CT appt-gave contrast-gave copyof sch

## 2014-08-11 NOTE — Progress Notes (Signed)
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  He was off both gleevec and tamoxifen for ~ 4 weeks while in [place] recently, has resumed both of these now. He has recently noticed ndular areas in left axilla close to surgical incision, and feels that there is more fullness at left breast. He does not have any new or different GI symptoms. He had B12 most recently 07-26-14 and received second Infed 50 mg also 07-26-14 for recurrent iron deficiency; he tolerated the Infed without difficulty. (His first Infed was in 11-2011, and he had severe dermatologic reaction to feraheme given prior in Infed.) He denies hematuria, flank pain, bone pain, shortness of breath.  Otherwise he has had vision changes bilaterally, described as cloudy, fuzzy on right and poor near vision on left, tho distant vision intact on left. He has no other neurologic complaints. He has never needed glasses, does not have ophthalmologist.  He does not have PAC. Genetics testing was negative 01-2014. He has had flu vaccine  Ongoing health insurance problems, but per EMR is approved for gleevec from [employer] free of charge from 06-06-14 thru 06-07-15.  ONCOLOGIC HISTORY LEFT BREAST CANCER Patient had been aware of palpable mass adjacent to left areola for possibly 2-3 months, which had not changed that he could tell during that time. He had bilateral mammograms and left US done at [facility] on 08-28-13, then excisional biopsy by Dr Brantley Stage on 09-23-13. 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 He had  re-excision with evaluation of sentinel nodes on 10-14-13 . He began tamoxifen 10-20-13, taking this  ([id number]), with no residual malignancy in the specimen and 6 sentinel nodes negative. He has had no post operative complications, tho still slight numbness in left axilla. At [facility] extent of surgery was felt to be adequate and radiation oncology did not recommend additional radiation therapy; genetics testing was negative 01-2014 and bilateral mastectomies not recommended.    GIST was initially disgnosed 2007, with subtotal gastrectomy with Roux-en-Y, then briefly on study with [place] which he did not tolerate due to marked elevations in LFTs. He had recurrent GIST in late 2012 with further surgery by Dr.Levine March 2011, then rapid elevation of LFTs with 50% dosing (200 mg/d) gleevec, with gleevec again discontinued after short period in 2011. He had further progression in June 2012, did tolerate gleevec at 140m/d with initial prednisone, with adequate shrinkage of tumor to allow surgery by Dr. LClovis RileyDec 6, 2012. At laparotomy he had extensive adhesions but no gross peritoneal, hepatic or bowel involvement. The mass itself was cystic and soft and not densely adherent to any visceral structures. Pathology([facility] [phone] recurrent GIST 8.9 cm with resection margins not involved and comment that microscopically this exhibits an epithelial and glandular growth pattern which differs from previous specimens; nuclei medium-sized and bland; immunohistochemical panel postive for c-kit. No mitotic index is reported. He saw Dr.Levine last in Dec 2012. [facility]; last PET Jan 2011.  [name] has needed B12 injections due to gastric/ bowel resection. He was iron deficient related to surgeries  and blood draws, previously had marked skin reaction to feraheme, but did tolerate inferon on 12-10-2011 and 07-26-2014 with no difficulty.   RENAL CANCER He has history of T1a renal  cell carcinoma, treated with partial right nephrectomy in 2008 and not active since then.  Review of systems as above, also: Good energy, good appetite with limitations from extensive GI surgery for the GIST. No pain. No change in skin lesions. No bleeding. Bowels ok. He has noticed that he voids more in late evenings than previously, and may get up once in night to void (known to Dr Diona Fanti but has not seen him recently) Remainder of 10 point Review of Systems negative.  Objective:  Vital signs in last 24 hours:  BP 121/66  Pulse 86  Temp(Src) 98.8 F (37.1 C) (Oral)  Resp 18  Ht 6" (0.152 m)  Wt 133 lb 1.6 oz (60.374 kg)  BMI 2613.14 kg/m2 Weight up 1 lb Alert, oriented and appropriate. Easily ambulatory. Talkative, NAD   HEENT:PERRL, sclerae not icteric. Right eye may have partial cataract, able to visualize upper retina only. Left fundoscopic able to visualize better.  Oral mucosa moist without lesions, posterior pharynx clear.  Neck supple. No JVD.  Lymphatics:no cervical,suraclavicular, right axillary adenopathy. Firm mobile nodularity anterior left axilla near surgical scar, <=0.5 cm  Resp: clear to auscultation bilaterally and normal percussion bilaterally Cardio: regular rate and rhythm. No gallop. GI: soft, nontender, not distended, no mass or organomegaly. Normally active bowel sounds. Surgical incisions not remarkable, possible slight ventral hernia or laxity right mid rectus area, more obvious when sits up from supine. Musculoskeletal/ Extremities: without pitting edema, cords, tenderness including LUE Neuro:  nonfocal PSYCH appropriate mood and affect Skin without rash, ecchymosis, petechiae. Area at right clavicle not obvious since excisional biopsy (benign) Breasts: right without dominant mass, skin or nipple findings. Left with well healed surgical scar at 4-5 o'clock, no fullness or mass that I can appreciate   Lab Results:  Results for orders placed in visit  on 08/11/14  CBC WITH DIFFERENTIAL      Result Value Ref Range   WBC 7.5  4.0 - 10.3 10e3/uL   NEUT# 3.6  1.5 - 6.5 10e3/uL   HGB 13.0  13.0 - 17.1 g/dL   HCT 40.0  38.4 - 49.9 %   Platelets 228  140 - 400 10e3/uL   MCV 88.9  79.3 - 98.0 fL   MCH 28.9  27.2 - 33.4 pg   MCHC 32.5  32.0 - 36.0 g/dL   RBC 4.50  4.20 - 5.82 10e6/uL   RDW 18.9 (*) 11.0 - 14.6 %   lymph# 3.1  0.9 - 3.3 10e3/uL   MONO# 0.7  0.1 - 0.9 10e3/uL   Eosinophils Absolute 0.1  0.0 - 0.5 10e3/uL   Basophils Absolute 0.1  0.0 - 0.1 10e3/uL   NEUT% 47.3  39.0 - 75.0 %   LYMPH% 41.3  14.0 - 49.0 %   MONO% 9.0  0.0 - 14.0 %   EOS% 1.5  0.0 - 7.0 %   BASO% 0.9  0.0 - 2.0 %  URINALYSIS, MICROSCOPIC - [facility]      Result Value Ref Range   Glucose 250  Negative mg/dL   Bilirubin (Urine) Negative  Negative   Ketones Negative  Negative mg/dL   Specific Gravity, Urine 1.020  1.003 - 1.035   Blood Negative  Negative   pH 6.5  4.6 - 8.0   Protein 30  Negative- <30  mg/dL   Urobilinogen, UR 0.2  0.2 - 1 mg/dL   Nitrite Negative  Negative   Leukocyte Esterase Negative  Negative   RBC / HPF 0-2  0 - 2   WBC, UA 0-2  0 - 2   Epithelial Cells Occasional  Negative- Few    Hgb up from 10.8 in April and 11.1 in July, with MCV 83 in July  Studies/Results:  No results found. Left mammogram and [place] ordered at [facility] 08-28-13.  Medications: I have reviewed the patient's current medications. He is taking tamoxifen 10 mg bid as this is thought best option with the gleevec.   DISCUSSION: For left breast/ axilla changes, he agrees to imaging at [facility] hopefully within the week. He would tike referral to ophthalmology in [place] if he can get appointment in near future, otherwise he will set up or get [place] name of ophthalmologist closer to his present residence. He agrees with CT in next month or so if he can get insurance coordinated. He understands that it is preferable not to have breaks on the gleevec  treatment.  Assessment/Plan: 1. 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 Nodularity in left anterior axilla and possible fullness left breast area to be evaluated at [facility]. 2.GIST: initially diagnosed 2007 with subtotal gastrectomy with Roux-en-Y, then briefly on study with [place] which he did not tolerate due to marked elevations in LFTs. Recurrent GIST with surgery by Dr Clovis Riley March 2011, then rapid elevation of LFTs with 50% dose reduction of Gleevec. Further progression June 2012, tolerated gleevec at 100 mg daily with initial steroids, further surgery Dec 2012. Continuing gleevec at 100 mg daily, tho recently off x 4 weeks while traveling overseas. CT in next 1-2 months if possible. 3.T1a renal cell carcinoma treated with right partial nephrectomy 2008. UA today ok.  4.B12 deficiency due to gastrectomy, on monthly B12  5.iron deficiency due to gastrectomy: intolerant to feraheme with severe skin reaction, does tolerate Infed and Hgb already better since most recent infusion 07-26-14. 6.long past tobacco  7.bilateral vision changes: may have cataract(s), is aware that these can be more rapidly developing on tamoxifen. Referral to ophthalmologist 8.atherosclerotic changes in aorta  9.difficult social situation: now lives ~ an hour from [place]. Insurance concerns ongoing.  10.reported hx manic depressive illness, tho I have never been clear about that diagnosis 11.increased voiding late in day: may be timing of fluid intake. PSA 10-2013 was 0.6    Patient is in agreement with plans and recommendations above. I will see him coordinating with B12 injection in Dec, or sooner if needed.   LIVESAY,Henry P, MD   08/11/2014, 3:35 PM

## 2014-08-13 ENCOUNTER — Telehealth: Payer: Self-pay

## 2014-08-13 NOTE — Telephone Encounter (Signed)
Message copied by Baruch Merl on Fri Aug 13, 2014  5:55 PM ------      Message from: Evlyn Clines P      Created: Thu Aug 12, 2014  9:40 PM       Labs seen and need follow up please let him know no elevation in LFTs and renal function good. Note he may be in [place] next week, breast/ axillary imaging and CTs set up apparently after the trip; I cannot tell if ophthalmology got set up, so please ask if you reach him   thanks ------

## 2014-08-13 NOTE — Telephone Encounter (Signed)
Left message in Mr. Tomasello the results of the chemistries from 08-11-14 as noted below by Dr. Marko Plume. Requested Mr. Liwanag call back to Dr. Mariana Kaufman nurse's voice mail and give the date, time, and name of ophthalmologist ifschedued or that it has not been scheduled as of 10-126-15.

## 2014-08-16 ENCOUNTER — Telehealth: Payer: Self-pay | Admitting: *Deleted

## 2014-08-16 NOTE — Telephone Encounter (Signed)
Pt left a message stating he has an appt on 11/9 with opthalmologist and an arm scan/injection

## 2014-08-20 ENCOUNTER — Other Ambulatory Visit: Payer: Self-pay | Admitting: Nurse Practitioner

## 2014-08-30 ENCOUNTER — Other Ambulatory Visit: Payer: Self-pay

## 2014-08-30 ENCOUNTER — Ambulatory Visit: Payer: Self-pay

## 2014-08-30 DIAGNOSIS — N63 Unspecified lump in unspecified breast: Secondary | ICD-10-CM

## 2014-08-30 DIAGNOSIS — C50422 Malignant neoplasm of upper-outer quadrant of left male breast: Secondary | ICD-10-CM

## 2014-08-31 ENCOUNTER — Ambulatory Visit
Admission: RE | Admit: 2014-08-31 | Discharge: 2014-08-31 | Disposition: A | Discharge status: Home / Self Care | Payer: Self-pay | Source: Ambulatory Visit | Attending: Oncology | Admitting: Oncology

## 2014-08-31 ENCOUNTER — Ambulatory Visit (HOSPITAL_BASED_OUTPATIENT_CLINIC_OR_DEPARTMENT_OTHER): Payer: Self-pay

## 2014-08-31 ENCOUNTER — Other Ambulatory Visit: Payer: Self-pay | Admitting: Oncology

## 2014-08-31 DIAGNOSIS — N63 Unspecified lump in unspecified breast: Secondary | ICD-10-CM

## 2014-08-31 DIAGNOSIS — C49A0 Gastrointestinal stromal tumor, unspecified site: Secondary | ICD-10-CM

## 2014-08-31 DIAGNOSIS — C50422 Malignant neoplasm of upper-outer quadrant of left male breast: Secondary | ICD-10-CM

## 2014-08-31 DIAGNOSIS — E538 Deficiency of other specified B group vitamins: Secondary | ICD-10-CM

## 2014-08-31 MED ORDER — CYANOCOBALAMIN 1000 MCG/ML IJ SOLN
1000.0000 ug | Freq: Once | INTRAMUSCULAR | Status: AC
  Administered 2014-08-31: 1000 ug via INTRAMUSCULAR

## 2014-08-31 NOTE — Patient Instructions (Signed)

## 2014-09-06 ENCOUNTER — Other Ambulatory Visit: Payer: Self-pay | Admitting: Oncology

## 2014-09-08 ENCOUNTER — Ambulatory Visit (HOSPITAL_COMMUNITY): Payer: Self-pay

## 2014-09-17 ENCOUNTER — Other Ambulatory Visit: Payer: Self-pay | Admitting: Oncology

## 2014-09-19 ENCOUNTER — Other Ambulatory Visit: Payer: Self-pay | Admitting: Oncology

## 2014-09-20 ENCOUNTER — Telehealth: Payer: Self-pay | Admitting: Oncology

## 2014-09-20 NOTE — Telephone Encounter (Signed)
, °

## 2014-09-27 ENCOUNTER — Ambulatory Visit (HOSPITAL_BASED_OUTPATIENT_CLINIC_OR_DEPARTMENT_OTHER): Payer: Self-pay | Admitting: Oncology

## 2014-09-27 ENCOUNTER — Other Ambulatory Visit: Payer: Self-pay | Admitting: Oncology

## 2014-09-27 ENCOUNTER — Encounter: Payer: Self-pay | Admitting: Oncology

## 2014-09-27 ENCOUNTER — Telehealth: Payer: Self-pay | Admitting: Oncology

## 2014-09-27 ENCOUNTER — Ambulatory Visit (HOSPITAL_BASED_OUTPATIENT_CLINIC_OR_DEPARTMENT_OTHER): Payer: Self-pay

## 2014-09-27 ENCOUNTER — Other Ambulatory Visit: Payer: Self-pay

## 2014-09-27 VITALS — BP 127/68 | HR 86 | Temp 97.6°F | Resp 18 | Ht 72.0 in | Wt 130.9 lb

## 2014-09-27 DIAGNOSIS — C169 Malignant neoplasm of stomach, unspecified: Secondary | ICD-10-CM

## 2014-09-27 DIAGNOSIS — E538 Deficiency of other specified B group vitamins: Secondary | ICD-10-CM

## 2014-09-27 DIAGNOSIS — C499 Malignant neoplasm of connective and soft tissue, unspecified: Secondary | ICD-10-CM

## 2014-09-27 DIAGNOSIS — C50422 Malignant neoplasm of upper-outer quadrant of left male breast: Secondary | ICD-10-CM

## 2014-09-27 DIAGNOSIS — C49A0 Gastrointestinal stromal tumor, unspecified site: Secondary | ICD-10-CM

## 2014-09-27 MED ORDER — TAMOXIFEN CITRATE 10 MG PO TABS: 10.0000 mg | ORAL_TABLET | Freq: Two times a day (BID) | ORAL | Status: DC

## 2014-09-27 MED ORDER — CYANOCOBALAMIN 1000 MCG/ML IJ SOLN
1000.0000 ug | Freq: Once | INTRAMUSCULAR | Status: AC
  Administered 2014-09-27: 1000 ug via INTRAMUSCULAR

## 2014-09-27 MED ORDER — IMATINIB MESYLATE 100 MG PO TABS
100.0000 mg | ORAL_TABLET | Freq: Every day | ORAL | Status: DC
Start: 2014-09-27 — End: 2015-02-08

## 2014-09-27 NOTE — Telephone Encounter (Signed)
per pof to sch pt appt-sch [place] w/BC-gave pt copy of sch

## 2014-09-27 NOTE — Progress Notes (Signed)
OFFICE PROGRESS NOTE   09/27/2014   Physicians:[name], [name], [name], [name], [name], [name], [name]  INTERVAL HISTORY:  Patient is seen, alone for visit, in follow up of left breast cancer on tamoxifen and recurrent GIST on gleevec, also iron deficiency anemia and B12 deficiency related to surgeries for GIST and remote renal cell carcinoma.   [name] had bilateral diagnostic mammograms at [name] on 08-31-14. From mammogram report, I cannot tell that radiologist was aware of question in axilla, tho I had tried to communicate that in order.  I discussed with another radiologist at [name] prior to this visit, confirming that left axilla was not imaged. The mammograms did not have findings of concern in breasts bilaterally. Patient agrees to returning to [name] for [place] of left axilla, and I have also requested appointment back with [name] due to physical exam findings in axilla.  [name] was seen by ophthalmology prior to my visit today, tells me that he does have advanced cataract OD and early cataract OS, vision better with glasses now.  Patient is feeling generally well. He has no discomfort in left axilla or left chest/ LUE. Appetite and energy are generally good. No bleeding. No abdominal pain.     He does not have PAC. Genetics testing was negative 01-2014. He has had flu vaccine  He expects to have insurance active in 10 days. 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  ONCOLOGIC HISTORY LEFT BREAST CANCER Patient had been aware of palpable mass adjacent to left areola for possibly 2-3 months, which had not changed that he could tell during that time. He had bilateral mammograms and left US done at [name] on 08-28-13, then excisional biopsy by [name] on 09-23-13. Pathology([id number] ) had 1 cm invasive ductal carcinoma grade 2, with associated intermediate grade DCIS, closest margin <1 mm, no LVSI noted, ER + 99%, PR + 97%,  Ki67 50% and Her-2 negative by CISH. He had re-excision with evaluation of sentinel nodes on 10-14-13 . He began tamoxifen 10-20-13, taking this  ([id number]), with no residual malignancy in the specimen and 6 sentinel nodes negative. He has had no post operative complications, tho still slight numbness in left axilla. At [name] extent of surgery was felt to be adequate and radiation oncology did not recommend additional radiation therapy; genetics testing was negative 01-2014 and bilateral mastectomies not recommended.   GIST was initially disgnosed 2007, with subtotal gastrectomy with Roux-en-Y, then briefly on study with [name] which he did not tolerate due to marked elevations in LFTs. He had recurrent GIST in late 2012 with further surgery by Henry.Levine March 2011, then rapid elevation of LFTs with 50% dosing (200 mg/d) gleevec, with gleevec again discontinued after short period in 2011. He had further progression in June 2012, did tolerate gleevec at 155m/d with initial prednisone, with adequate shrinkage of tumor to allow surgery by Henry. LClovis RileyDec 6, 2012. At laparotomy he had extensive adhesions but no gross peritoneal, hepatic or bowel involvement. The mass itself was cystic and soft and not densely adherent to any visceral structures. Pathology([facility] [phone] recurrent GIST 8.9 cm with resection margins not involved and comment that microscopically this exhibits an epithelial and glandular growth pattern which differs from previous specimens; nuclei medium-sized and bland; immunohistochemical panel postive for c-kit. No mitotic index is reported. He saw Henry.Levine last in Dec 2012. [facility]; last PET Jan 2011.  [name] has needed B12  injections due to gastric/ bowel resection. He was iron deficient related to surgeries and blood draws, previously had marked skin reaction to feraheme, but did tolerate inferon on 12-10-2011 and 07-26-2014 with no difficulty.    RENAL CANCER  history of T1a renal cell carcinoma, treated with partial right nephrectomy in 2008 and not active since then.    Review of systems as above, also: No fever or symptoms of infection. No cough or SOB. No extremity swelling. No new or different pain. Remainder of 10 point Review of Systems negative.  Objective:  Vital signs in last 24 hours:  BP 127/68 mmHg  Pulse 86  Temp(Src) 97.6 F (36.4 C) (Oral)  Resp 18  Ht 6' (1.829 m)  Wt 130 lb 14.4 oz (59.376 kg)  BMI 17.75 kg/m2  SpO2 98% Weight down 2 lbs Alert, oriented and appropriate. Ambulatory without difficulty.  HEENT: right pupil still widely dilated from exam this AM, sclerae not icteric. Oral mucosa moist without lesions, posterior pharynx clear.  Neck supple. No JVD.  Lymphatics:no cervical,surpaclavicular adenopathy. No right axillary adenopathy. Palpable thickened area inferior to left axillary scar ~ 0.5 x 1.5 cm, not fixed. No deep left axillary adenopathy appreciated. Resp: clear to auscultation bilaterally and normal percussion bilaterally Cardio: regular rate and rhythm. No gallop. GI: soft, nontender, not distended, no mass or organomegaly. Normally active bowel sounds. Surgical incisions not remarkable. Musculoskeletal/ Extremities: without pitting edema, cords, tenderness Neuro: no peripheral neuropathy. Otherwise nonfocal Skin without rash, ecchymosis, petechiae Breasts: Left lumpectomy scar healed, otherwise bilaterally without dominant mass, skin or nipple findings.    Lab Results: Labs not repeated today, last: Results for orders placed or performed in visit on 08/11/14  CBC with Differential  Result Value Ref Range   WBC 7.5 4.0 - 10.3 10e3/uL   NEUT# 3.6 1.5 - 6.5 10e3/uL   HGB 13.0 13.0 - 17.1 g/dL   HCT 40.0 38.4 - 49.9 %   Platelets 228 140 - 400 10e3/uL   MCV 88.9 79.3 - 98.0 fL   MCH 28.9 27.2 - 33.4 pg   MCHC 32.5 32.0 - 36.0 g/dL   RBC 4.50 4.20 - 5.82 10e6/uL   RDW 18.9  (H) 11.0 - 14.6 %   lymph# 3.1 0.9 - 3.3 10e3/uL   MONO# 0.7 0.1 - 0.9 10e3/uL   Eosinophils Absolute 0.1 0.0 - 0.5 10e3/uL   Basophils Absolute 0.1 0.0 - 0.1 10e3/uL   NEUT% 47.3 39.0 - 75.0 %   LYMPH% 41.3 14.0 - 49.0 %   MONO% 9.0 0.0 - 14.0 %   EOS% 1.5 0.0 - 7.0 %   BASO% 0.9 0.0 - 2.0 %  Comprehensive metabolic panel (Cmet) - [facility]  Result Value Ref Range   Sodium 145 136 - 145 mEq/L   Potassium 4.1 3.5 - 5.1 mEq/L   Chloride 109 98 - 109 mEq/L   CO2 27 22 - 29 mEq/L   Glucose 124 70 - 140 mg/dl   BUN 14.2 7.0 - 26.0 mg/dL   Creatinine 1.1 0.7 - 1.3 mg/dL   Total Bilirubin 1.07 0.20 - 1.20 mg/dL   Alkaline Phosphatase 31 (L) 40 - 150 U/L   AST 20 5 - 34 U/L   ALT 22 0 - 55 U/L   Total Protein 6.3 (L) 6.4 - 8.3 g/dL   Albumin 3.5 3.5 - 5.0 g/dL   Calcium 9.3 8.4 - 10.4 mg/dL   Anion Gap 9 3 - 11 mEq/L  Urinalysis with microscopic - [facility]  Result Value Ref Range   Glucose 250 Negative mg/dL   Bilirubin (Urine) Negative Negative   Ketones Negative Negative mg/dL   Specific Gravity, Urine 1.020 1.003 - 1.035   Blood Negative Negative   pH 6.5 4.6 - 8.0   Protein 30 Negative- <30 mg/dL   Urobilinogen, UR 0.2 0.2 - 1 mg/dL   Nitrite Negative Negative   Leukocyte Esterase Negative Negative   RBC / HPF 0-2 0 - 2   WBC, UA 0-2 0 - 2   Epithelial Cells Occasional Negative- Few     Studies/Results: EXAM: DIGITAL DIAGNOSTIC BILATERAL MAMMOGRAM WITH CAD  COMPARISON: August 28, 2013  ACR Breast Density Category b: There are scattered areas of fibroglandular density.  FINDINGS: Cc and MLO views of bilateral breasts, spot tangential view of left breast are submitted. No suspicious abnormalities identified bilaterally. Postsurgical changes identified in left breast.  Mammographic images were processed with CAD.  IMPRESSION: Benign findings.  RECOMMENDATION: Bilateral diagnostic mammogram in 1 year.  I have discussed the findings and recommendations with  the patient. Results were also provided in writing at the conclusion of the visit. If applicable, a reminder letter will be sent to the patient regarding the next appointment.  BI-RADS CATEGORY 2: Benign Finding(s)  Medications: I have reviewed the patient's current medications. He is approved for [place] thru [employer] thru 06-07-15. Tamoxifen is dosed 10 mg bid due to gleevec. Has been taking medications. Monthly B12 injection given today. Last Infed 07-26-14.  DISCUSSION: Patient requests next appointments >= 10 days from now due to insurance. I have spoken with [facility] office, next available 10-21-14 1010; will try to get [place] prior to that. He will also have repeat body CTs in follow up of GIST after insurance active.  Assessment/Plan:  1. 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 Nodularity in left anterior axilla  to be evaluated at [name] upcoming as this was not imaged with mammograms recently, and visit back to [name]. Continue tamoxifen for now. 2.GIST: initially diagnosed 2007 with subtotal gastrectomy with Roux-en-Y, then briefly on study with [place] which he did not tolerate due to marked elevations in LFTs. Recurrent GIST with surgery by [name] March 2011, then rapid elevation of LFTs with 50% dose reduction of Gleevec. Further progression June 2012, tolerated gleevec at 100 mg daily with initial steroids, further surgery Dec 2012. Continuing gleevec at 100 mg daily, tho recently off x 4 weeks while traveling [place]. CT in next 1-2 months if possible. 3.T1a renal cell carcinoma treated with right partial nephrectomy 2008. UA today ok.  4.B12 deficiency due to gastrectomy, on monthly B12  5.iron deficiency due to gastrectomy: intolerant to feraheme with severe skin reaction, does tolerate Infed  and Hgb better since most recent infusion 07-26-14 when checked last 6.long past tobacco  7.bilateral vision changes: cataracts. Seen by ophthalmologist today. Cataracts may be increasing more rapidly due to tamoxifen, tho treatment and anticipated outcome would not be different due to this. 8.atherosclerotic changes in aorta  9.difficult social situation: now lives ~ an hour from [place]. Insurance concerns ongoing.  10.reported hx manic depressive illness, tho I have never been clear about that diagnosis 11.increased voiding late in day: may be timing of fluid intake. PSA 10-2013 was 0.6   I will see him back coordinating with B12 injections, or sooner if  needed.  Will fax this note to [name] [phone]   Henry Levan, MD   09/27/2014, 2:21 PM

## 2014-09-27 NOTE — Patient Instructions (Signed)

## 2014-10-04 ENCOUNTER — Telehealth: Payer: Self-pay | Admitting: *Deleted

## 2014-10-04 NOTE — Telephone Encounter (Signed)
Faxed Dr Marko Plume office notes and November mammogram to Dr Josetta Huddle office

## 2014-10-05 ENCOUNTER — Other Ambulatory Visit: Payer: Self-pay | Admitting: Oncology

## 2014-10-05 ENCOUNTER — Other Ambulatory Visit: Payer: Self-pay

## 2014-10-05 DIAGNOSIS — C50422 Malignant neoplasm of upper-outer quadrant of left male breast: Secondary | ICD-10-CM

## 2014-10-07 ENCOUNTER — Ambulatory Visit
Admission: RE | Admit: 2014-10-07 | Discharge: 2014-10-07 | Disposition: A | Discharge status: Home / Self Care | Payer: Self-pay | Source: Ambulatory Visit | Attending: Oncology | Admitting: Oncology

## 2014-10-07 DIAGNOSIS — C50422 Malignant neoplasm of upper-outer quadrant of left male breast: Secondary | ICD-10-CM

## 2014-10-08 NOTE — Telephone Encounter (Addendum)
Faxed left axilla ultrasound report from 10/07/14 to Dr. Josetta Huddle office at [phone].

## 2014-10-25 ENCOUNTER — Ambulatory Visit (HOSPITAL_BASED_OUTPATIENT_CLINIC_OR_DEPARTMENT_OTHER): Payer: Self-pay

## 2014-10-25 DIAGNOSIS — C49A0 Gastrointestinal stromal tumor, unspecified site: Secondary | ICD-10-CM

## 2014-10-25 DIAGNOSIS — E538 Deficiency of other specified B group vitamins: Secondary | ICD-10-CM

## 2014-10-25 MED ORDER — CYANOCOBALAMIN 1000 MCG/ML IJ SOLN
1000.0000 ug | Freq: Once | INTRAMUSCULAR | Status: AC
  Administered 2014-10-25: 1000 ug via INTRAMUSCULAR

## 2014-10-25 NOTE — Patient Instructions (Signed)

## 2014-11-05 ENCOUNTER — Telehealth: Payer: Self-pay

## 2014-11-05 NOTE — Telephone Encounter (Signed)
[name] states that his insurance is reinstated.  It is  Costing him $506.00/month. His insurance denied his visit with Dr. Brantley Stage.  He needs to see his PCP first. He will cancell appt. With Dr. Brantley Stage on 11-08-14 and schedule an appt. with PCP an reschedule the appointment with Dr. Brantley Stage. He needs his CT scan scheduled now that he has insurance. Requested a copy of his insurance card for prior authorization.  He does not have a card.  He will call Dr. Mariana Kaufman nurse on 11-08-14 with his insurance ID number.  He stated that his insurance is through [facility].

## 2014-11-22 ENCOUNTER — Ambulatory Visit: Payer: Self-pay

## 2014-11-22 ENCOUNTER — Telehealth: Payer: Self-pay | Admitting: *Deleted

## 2014-11-22 NOTE — Telephone Encounter (Signed)
Called patient about injection appointment.  Left message for him to call back and reschedule appointment

## 2014-11-29 ENCOUNTER — Other Ambulatory Visit: Payer: Self-pay | Admitting: Oncology

## 2014-11-29 ENCOUNTER — Ambulatory Visit (HOSPITAL_BASED_OUTPATIENT_CLINIC_OR_DEPARTMENT_OTHER): Payer: BLUE CROSS/BLUE SHIELD

## 2014-11-29 DIAGNOSIS — E538 Deficiency of other specified B group vitamins: Secondary | ICD-10-CM

## 2014-11-29 DIAGNOSIS — C49A0 Gastrointestinal stromal tumor, unspecified site: Secondary | ICD-10-CM

## 2014-11-29 MED ORDER — CYANOCOBALAMIN 1000 MCG/ML IJ SOLN
1000.0000 ug | Freq: Once | INTRAMUSCULAR | Status: AC
  Administered 2014-11-29: 1000 ug via INTRAMUSCULAR

## 2014-11-30 ENCOUNTER — Telehealth: Payer: Self-pay | Admitting: Oncology

## 2014-11-30 ENCOUNTER — Telehealth: Payer: Self-pay | Admitting: *Deleted

## 2014-11-30 DIAGNOSIS — C50422 Malignant neoplasm of upper-outer quadrant of left male breast: Secondary | ICD-10-CM

## 2014-11-30 NOTE — Telephone Encounter (Signed)
, °

## 2014-11-30 NOTE — Telephone Encounter (Signed)
Received call from [facility] in managed care stating that patient will need lab appt before CT scan on 12/15/14.  Lab order & appt placed - called patient and left VM letting him know to be at [facility] at 10:30 prior to CT scan at 11:30. Told patient to please call us back with any questions or concerns regarding this appt.

## 2014-12-15 ENCOUNTER — Other Ambulatory Visit (HOSPITAL_BASED_OUTPATIENT_CLINIC_OR_DEPARTMENT_OTHER): Payer: BLUE CROSS/BLUE SHIELD

## 2014-12-15 ENCOUNTER — Ambulatory Visit (HOSPITAL_COMMUNITY): Payer: BLUE CROSS/BLUE SHIELD

## 2014-12-15 DIAGNOSIS — C50422 Malignant neoplasm of upper-outer quadrant of left male breast: Secondary | ICD-10-CM

## 2014-12-15 LAB — BASIC METABOLIC PANEL (CC13)
Anion Gap: 9 mEq/L (ref 3–11)
BUN: 9.9 mg/dL (ref 7.0–26.0)
CHLORIDE: 110 meq/L — AB (ref 98–109)
CO2: 26 mEq/L (ref 22–29)
Calcium: 8.5 mg/dL (ref 8.4–10.4)
Creatinine: 0.9 mg/dL (ref 0.7–1.3)
Glucose: 141 mg/dl — ABNORMAL HIGH (ref 70–140)
Potassium: 3.7 mEq/L (ref 3.5–5.1)
Sodium: 145 mEq/L (ref 136–145)

## 2014-12-17 ENCOUNTER — Telehealth: Payer: Self-pay | Admitting: *Deleted

## 2014-12-17 NOTE — Telephone Encounter (Signed)
Spoke with Mr. Washko and he asked if it would be ok to get his CT Chest /Abdomen/Pelvis at [facility].  The cost to him out of pocket would be ~ $700 verses ~2600 in the [facility]. Mr. Cheema will call [facility] and verify his cost and let Dr. Mariana Kaufman office know if he wants to proceed with [place] if it is ok with Dr. Marko Plume.

## 2014-12-17 NOTE — Telephone Encounter (Signed)
COULD PT. GET HIS CAT SCAN AT ANOTHER FACILITY OTHER THAN [facility]? THIS NOTE TO LOUISE ARCHAMBAULT,RN

## 2014-12-20 ENCOUNTER — Ambulatory Visit: Payer: Self-pay

## 2014-12-24 ENCOUNTER — Telehealth: Payer: Self-pay | Admitting: *Deleted

## 2014-12-24 NOTE — Telephone Encounter (Signed)
Henry [name] called to say he wants to have CTs done at [place] due to the huge difference in his out of pocket expenses when those are done at [facility].  He was not having luck scheduling those because they have been ordered at [facility].  Per [employer], they should be able to schedule regardless.  Called [facility] and they can schedule for the patient.  It appears that these scans have already been authorized.  He will call [facility] back, attempt to schedule, then call back to adjust his appointment with Dr. Marko Plume based upon when he is able to have the scans.

## 2014-12-27 ENCOUNTER — Ambulatory Visit: Payer: Self-pay

## 2014-12-27 ENCOUNTER — Ambulatory Visit: Payer: Self-pay | Admitting: Oncology

## 2014-12-27 ENCOUNTER — Other Ambulatory Visit: Payer: Self-pay

## 2014-12-30 ENCOUNTER — Ambulatory Visit
Admission: RE | Admit: 2014-12-30 | Discharge: 2014-12-30 | Disposition: A | Discharge status: Home / Self Care | Payer: BLUE CROSS/BLUE SHIELD | Source: Ambulatory Visit | Attending: Oncology | Admitting: Oncology

## 2014-12-30 ENCOUNTER — Ambulatory Visit (HOSPITAL_BASED_OUTPATIENT_CLINIC_OR_DEPARTMENT_OTHER): Payer: BLUE CROSS/BLUE SHIELD

## 2014-12-30 DIAGNOSIS — C49A0 Gastrointestinal stromal tumor, unspecified site: Secondary | ICD-10-CM

## 2014-12-30 DIAGNOSIS — E538 Deficiency of other specified B group vitamins: Secondary | ICD-10-CM

## 2014-12-30 MED ORDER — CYANOCOBALAMIN 1000 MCG/ML IJ SOLN
1000.0000 ug | Freq: Once | INTRAMUSCULAR | Status: AC
  Administered 2014-12-30: 1000 ug via INTRAMUSCULAR

## 2014-12-30 MED ORDER — IOPAMIDOL (ISOVUE-300) INJECTION 61%
100.0000 mL | Freq: Once | INTRAVENOUS | Status: AC | PRN
  Administered 2014-12-30: 100 mL via INTRAVENOUS

## 2014-12-31 ENCOUNTER — Telehealth: Payer: Self-pay | Admitting: *Deleted

## 2014-12-31 NOTE — Telephone Encounter (Signed)
Jane at [facility] called report of CT scan which revealed a new lesion in the Rt hepatic lobe, consistent with a new metastatic lesion.  Report printed and taken to Dr. Mariana Kaufman nurse, Santa Lighter.

## 2015-01-09 ENCOUNTER — Other Ambulatory Visit: Payer: Self-pay | Admitting: Oncology

## 2015-01-09 DIAGNOSIS — C50422 Malignant neoplasm of upper-outer quadrant of left male breast: Secondary | ICD-10-CM

## 2015-01-09 DIAGNOSIS — D509 Iron deficiency anemia, unspecified: Secondary | ICD-10-CM

## 2015-01-09 DIAGNOSIS — C49A0 Gastrointestinal stromal tumor, unspecified site: Secondary | ICD-10-CM

## 2015-01-10 ENCOUNTER — Other Ambulatory Visit (HOSPITAL_BASED_OUTPATIENT_CLINIC_OR_DEPARTMENT_OTHER): Payer: BLUE CROSS/BLUE SHIELD

## 2015-01-10 ENCOUNTER — Ambulatory Visit (HOSPITAL_BASED_OUTPATIENT_CLINIC_OR_DEPARTMENT_OTHER): Payer: BLUE CROSS/BLUE SHIELD | Admitting: Oncology

## 2015-01-10 ENCOUNTER — Encounter: Payer: Self-pay | Admitting: Oncology

## 2015-01-10 VITALS — BP 125/76 | HR 76 | Temp 99.1°F | Resp 18 | Ht 72.0 in | Wt 127.7 lb

## 2015-01-10 DIAGNOSIS — C49A0 Gastrointestinal stromal tumor, unspecified site: Secondary | ICD-10-CM

## 2015-01-10 DIAGNOSIS — D538 Other specified nutritional anemias: Secondary | ICD-10-CM

## 2015-01-10 DIAGNOSIS — D519 Vitamin B12 deficiency anemia, unspecified: Secondary | ICD-10-CM

## 2015-01-10 DIAGNOSIS — C50422 Malignant neoplasm of upper-outer quadrant of left male breast: Secondary | ICD-10-CM | POA: Diagnosis not present

## 2015-01-10 DIAGNOSIS — Z87891 Personal history of nicotine dependence: Secondary | ICD-10-CM

## 2015-01-10 DIAGNOSIS — K769 Liver disease, unspecified: Secondary | ICD-10-CM | POA: Diagnosis not present

## 2015-01-10 DIAGNOSIS — D509 Iron deficiency anemia, unspecified: Secondary | ICD-10-CM

## 2015-01-10 DIAGNOSIS — Z8589 Personal history of malignant neoplasm of other organs and systems: Secondary | ICD-10-CM

## 2015-01-10 DIAGNOSIS — C494 Malignant neoplasm of connective and soft tissue of abdomen: Secondary | ICD-10-CM

## 2015-01-10 DIAGNOSIS — R911 Solitary pulmonary nodule: Secondary | ICD-10-CM

## 2015-01-10 DIAGNOSIS — E611 Iron deficiency: Secondary | ICD-10-CM

## 2015-01-10 DIAGNOSIS — R160 Hepatomegaly, not elsewhere classified: Secondary | ICD-10-CM

## 2015-01-10 DIAGNOSIS — D508 Other iron deficiency anemias: Secondary | ICD-10-CM

## 2015-01-10 LAB — COMPREHENSIVE METABOLIC PANEL (CC13)
ALT: 12 U/L (ref 0–55)
ANION GAP: 10 meq/L (ref 3–11)
AST: 15 U/L (ref 5–34)
Albumin: 3.6 g/dL (ref 3.5–5.0)
Alkaline Phosphatase: 26 U/L — ABNORMAL LOW (ref 40–150)
BILIRUBIN TOTAL: 2.06 mg/dL — AB (ref 0.20–1.20)
BUN: 7 mg/dL (ref 7.0–26.0)
CO2: 27 meq/L (ref 22–29)
CREATININE: 0.8 mg/dL (ref 0.7–1.3)
Calcium: 8.4 mg/dL (ref 8.4–10.4)
Chloride: 106 mEq/L (ref 98–109)
Glucose: 99 mg/dl (ref 70–140)
Potassium: 3.9 mEq/L (ref 3.5–5.1)
Sodium: 143 mEq/L (ref 136–145)
Total Protein: 6.3 g/dL — ABNORMAL LOW (ref 6.4–8.3)

## 2015-01-10 LAB — CBC WITH DIFFERENTIAL/PLATELET
BASO%: 0.8 % (ref 0.0–2.0)
BASOS ABS: 0.1 10*3/uL (ref 0.0–0.1)
EOS%: 1 % (ref 0.0–7.0)
Eosinophils Absolute: 0.1 10*3/uL (ref 0.0–0.5)
HCT: 42.1 % (ref 38.4–49.9)
HEMOGLOBIN: 13.6 g/dL (ref 13.0–17.1)
LYMPH%: 38.8 % (ref 14.0–49.0)
MCH: 30.2 pg (ref 27.2–33.4)
MCHC: 32.3 g/dL (ref 32.0–36.0)
MCV: 93.4 fL (ref 79.3–98.0)
MONO#: 0.7 10*3/uL (ref 0.1–0.9)
MONO%: 9.4 % (ref 0.0–14.0)
NEUT%: 50 % (ref 39.0–75.0)
NEUTROS ABS: 3.8 10*3/uL (ref 1.5–6.5)
PLATELETS: 231 10*3/uL (ref 140–400)
RBC: 4.51 10*6/uL (ref 4.20–5.82)
RDW: 14.1 % (ref 11.0–14.6)
WBC: 7.6 10*3/uL (ref 4.0–10.3)
lymph#: 3 10*3/uL (ref 0.9–3.3)

## 2015-01-10 LAB — IRON AND TIBC CHCC
%SAT: 42 % (ref 20–55)
Iron: 149 ug/dL (ref 42–163)
TIBC: 353 ug/dL (ref 202–409)
UIBC: 203 ug/dL (ref 117–376)

## 2015-01-10 LAB — FERRITIN CHCC: Ferritin: 53 ng/ml (ref 22–316)

## 2015-01-10 NOTE — Progress Notes (Signed)
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 He had repeat CT CAP at [place] 12-30-14, with new mixed density lesion 2 x 2.4 cm right hepatic lobe, as compared with CT 08-2013. I discussed with radiologist prior to this visit, and the area appears easily amenable to percutaneous biopsy.  He had left axillary [place] 10-07-14 at [facility], with combination of scarring and normal sized lymph nodes in the palpable axillary area, repeat US recommended in 6 months. Last bilateral mammograms were at [facility] 08-31-14. Last B12 was 12-30-14, these injections monthly. Last Infed 07-26-14; does not absorb oral iron.  Mr Horger has felt well, with no problems obtaining either tamoxifen or gleevec recently. Energy is good, no new or different pain, appetite excellent "hungry every 2 hours" as he is post subtotal gastrectomy for the GIST. He has had some urinary frequency.   No PAC Genetics testing negative 01-2014  His sister, a neurologist, died in 12-05-2014 of an undiagnosed liver problem, without medical care due to lack of insurance.  ONCOLOGIC HISTORY LEFT BREAST CANCER Patient had been aware of palpable mass adjacent to left areola for possibly 2-3 months, which had not changed that he could tell during that time. He had bilateral mammograms and left US done at [facility] on 08-28-13, then excisional biopsy by Dr Brantley Stage on 09-23-13. 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 He had re-excision with evaluation of  sentinel nodes on 10-14-13 . He began tamoxifen 10-20-13, taking this  ([id number]), with no residual malignancy in the specimen and 6 sentinel nodes negative. He has had no post operative complications, tho still slight numbness in left axilla. At [facility] extent of surgery was felt to be adequate and radiation oncology did not recommend additional radiation therapy; genetics testing was negative 01-2014 and bilateral mastectomies not recommended.   GIST was initially disgnosed 2007, with subtotal gastrectomy with Roux-en-Y, then briefly on study with [place] which he did not tolerate due to marked elevations in LFTs. He had recurrent GIST in late 2012 with further surgery by Dr.Levine March 2011, then rapid elevation of LFTs with 50% dosing (200 mg/d) gleevec, with gleevec again discontinued after short period in 2011. He had further progression in June 2012, did tolerate gleevec at 165m/d with initial prednisone, with adequate shrinkage of tumor to allow surgery by Dr. LClovis RileyDec 6, 2012. At laparotomy he had extensive adhesions but no gross peritoneal, hepatic or bowel involvement. The mass itself was cystic and soft and not densely adherent to any visceral structures. Pathology([facility] [phone] recurrent GIST 8.9 cm with resection margins not involved and comment that microscopically this exhibits an epithelial and glandular growth pattern which differs from previous specimens; nuclei medium-sized and bland; immunohistochemical panel postive for c-kit. No mitotic index is reported. He saw Dr.Levine last in Dec 2012. [facility]; last PET JFeb 14, 2011  Mr.Wey has needed B12 injections due to gastric/ bowel resection. He was iron deficient related to surgeries and blood draws, previously had marked  skin reaction to feraheme, but did tolerate inferon on 12-10-2011 and 07-26-2014 with no difficulty.   RENAL CANCER history of T1a renal cell carcinoma, treated with  partial right nephrectomy in 2008 and not active since then.    Review of systems as above, also: No fever or symptoms of infection. No SOB or other respiratory symptoms. Bowels ok. No bleeding. No LE or UE swelling. Remainder of 10 point Review of Systems negative.  Objective:  Vital signs in last 24 hours:  BP 125/76 mmHg  Pulse 76  Temp(Src) 99.1 F (37.3 C) (Oral)  Resp 18  Ht 6' (1.829 m)  Wt 127 lb 11.2 oz (57.924 kg)  BMI 17.32 kg/m2 Weight down 3 lbs. Alert, oriented and appropriate. Ambulatory without difficulty.    HEENT:PERRL, sclerae not icteric. Oral mucosa moist without lesions, posterior pharynx clear.  Neck supple. No JVD.  Lymphatics:no cervical,supraclavicular adenopathy Resp: clear to auscultation bilaterally and normal percussion bilaterally Cardio: regular rate and rhythm. No gallop. GI: abdomen soft, nontender, not distended, no mass or organomegaly. Normally active bowel sounds. Surgical incisions not remarkable. Musculoskeletal/ Extremities: without pitting edema, cords, tenderness Neuro:  nonfocal  PSYCH appropriate mood and affect Skin without rash, ecchymosis, petechiae   Lab Results:  Results for orders placed or performed in visit on 01/10/15  CBC with Differential  Result Value Ref Range   WBC 7.6 4.0 - 10.3 10e3/uL   NEUT# 3.8 1.5 - 6.5 10e3/uL   HGB 13.6 13.0 - 17.1 g/dL   HCT 42.1 38.4 - 49.9 %   Platelets 231 140 - 400 10e3/uL   MCV 93.4 79.3 - 98.0 fL   MCH 30.2 27.2 - 33.4 pg   MCHC 32.3 32.0 - 36.0 g/dL   RBC 4.51 4.20 - 5.82 10e6/uL   RDW 14.1 11.0 - 14.6 %   lymph# 3.0 0.9 - 3.3 10e3/uL   MONO# 0.7 0.1 - 0.9 10e3/uL   Eosinophils Absolute 0.1 0.0 - 0.5 10e3/uL   Basophils Absolute 0.1 0.0 - 0.1 10e3/uL   NEUT% 50.0 39.0 - 75.0 %   LYMPH% 38.8 14.0 - 49.0 %   MONO% 9.4 0.0 - 14.0 %   EOS% 1.0 0.0 - 7.0 %   BASO% 0.8 0.0 - 2.0 %  Comprehensive metabolic panel (Cmet) - [facility]  Result Value Ref Range   Sodium 143 136 - 145  mEq/L   Potassium 3.9 3.5 - 5.1 mEq/L   Chloride 106 98 - 109 mEq/L   CO2 27 22 - 29 mEq/L   Glucose 99 70 - 140 mg/dl   BUN 7.0 7.0 - 26.0 mg/dL   Creatinine 0.8 0.7 - 1.3 mg/dL   Total Bilirubin 2.06 (H) 0.20 - 1.20 mg/dL   Alkaline Phosphatase 26 (L) 40 - 150 U/L   AST 15 5 - 34 U/L   ALT 12 0 - 55 U/L   Total Protein 6.3 (L) 6.4 - 8.3 g/dL   Albumin 3.6 3.5 - 5.0 g/dL   Calcium 8.4 8.4 - 10.4 mg/dL   Anion Gap 10 3 - 11 mEq/L   EGFR >90 >90 ml/min/1.73 m2  Iron and TIBC [facility]  Result Value Ref Range   Iron 149 42 - 163 ug/dL   TIBC 353 202 - 409 ug/dL   UIBC 203 117 - 376 ug/dL   %SAT 42 20 - 55 %  Ferritin  Result Value Ref Range   Ferritin 53 22 - 316 ng/ml     Studies/Results:  EXAM: CT CHEST,  ABDOMEN, AND PELVIS WITH CONTRAST  TECHNIQUE: Multidetector CT imaging of the chest, abdomen and pelvis was performed following the standard protocol during bolus administration of intravenous contrast.  CONTRAST: 100 mL Isovue  COMPARISON: CT 08/28/2013, PET-CT 11/11/2009  FINDINGS: CT CHEST FINDINGS  Mediastinum/Nodes: No axillary supraclavicular lymphadenopathy. No mediastinal hilar lymphadenopathy.  Lungs/Pleura: 3 mm right middle lobe pulmonary nodule stable (image 43, series 4). 2 mm right middle lobe nodule is also unchanged on image 38. No new pulmonary nodules.  CT ABDOMEN AND PELVIS FINDINGS  Hepatobiliary: New mixed density lesion in right hepatic lobe measures 24 x 20 mm (image 66, series 3). No biliary duct dilatation. Post cholecystectomy. Post partial hepatectomy of the left hepatic lobe.  Pancreas: The common bile duct is normal caliber following cholecystectomy. Pancreas and pancreatic duct are normal.  Spleen: Spleen is very small.  Adrenals/Urinary Tract: Adrenal glands and kidneys are normal. Bladder is distended.  Stomach/Bowel: Stomach, small bowel, colon are normal. Little intra-abdominal fat does make evaluation of  the peritoneal space difficult.  Vascular/Lymphatic: Although or is normal caliber. No retroperitoneal periportal lymphadenopathy.  Reproductive: Prostate gland is normal. No pelvic lymphadenopathy.  Other: No omental or peritoneal disease.  Musculoskeletal: No aggressive osseous lesion.  IMPRESSION: Chest Impression:  No evidence of thoracic metastasis.  Abdomen / Pelvis Impression:  1. NEW enhancing lesion in the right hepatic lobe is most consistent with a new metastatic lesion. Consider percutaneous biopsy. 2. No intra-abdominal mass in the peritoneal space. 3. Small spleen again demonstrated. 4. Mild bladder distension.  PACS images reviewed with patient now.   EXAM: ULTRASOUND LEFT AXILLA LIMITED 10-07-14  TECHNIQUE: Ultrasound examination of the upper extremity soft tissues was performed in the area of clinical concern.  COMPARISON: Previous mammographies  FINDINGS: There are is irregular areas of hypoattenuation in the left axilla were to normal sized adjacent lymph nodes penny this likely reflects a combination of scarring and normal axillary lymph nodes. Largest node measures 3 mm in short axis and 1.4 mm in cortical thickness.  IMPRESSION: Probably benign findings in the left axilla are most likely a combination of scarring and normal size lymph nodes. Short-term follow-up recommended.  RECOMMENDATION:  Repeat left axillary ultrasound in 6 months.  BI-RADS CATEGORY: 3- probably benign.     Medications: I have reviewed the patient's current medications.  DISCUSSION: CT findings discussed and PACs images reviewed. Certainly the new liver lesion is concerning for metastatic disease, seems more likely to be GIST than the breast cancer, needs path confirmation. He is in agreement with biopsy, which radiologist thought might be appropriate under [place], vs CT. I will see him back after biopsy. We mentioned referral back to Dr Clovis Riley or [facility]  as possible options depending on biopsy findings.  Assessment/Plan: 1.new 2 x 2.4 mixed density right liver lesion new from 08-2013.Have requested biopsy by IR  2. 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 Nodularity in left anterior axillaappeared scarring + non-enlarged lymph nodes. Continue tamoxifen for now. 3.GIST: initially diagnosed 2007 with subtotal gastrectomy with Roux-en-Y, then briefly on study with [place] which he did not tolerate due to marked elevations in LFTs. Recurrent GIST with surgery by Dr Clovis Riley March 2011, then rapid elevation of LFTs with 50% dose reduction of Gleevec. Further progression June 2012, tolerated gleevec at 100 mg daily with initial steroids, further surgery Dec  2012. Continuing gleevec at 100 mg daily. Note some potential interaction with tamoxifen. 4.T1a renal cell carcinoma treated with right partial nephrectomy 2008. 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 Stable tiny lung nodules, no other pulmonary findings of concern on CT  8.cataracts: recently diagnoses, may be increasing more quickly on tamoxifen. We did not discuss these further today. 9.atherosclerotic changes in aorta  10.difficult social situation: now lives ~ an hour from [place]. Insurance concerns ongoing.  11.reported hx manic depressive illness, tho I have never been clear about that diagnosis 12.increased voiding late in day:PSA 10-2013 was 0.6, prostate ok on CT 13.low weight: limited po intake since nearly total gastrectomy  All questions answered. Patient knows that he can call if  concerns prior to next scheduled visit   Antonin Meininger P, MD   01/10/2015, 4:34 PM

## 2015-01-13 ENCOUNTER — Telehealth: Payer: Self-pay | Admitting: Oncology

## 2015-01-13 ENCOUNTER — Other Ambulatory Visit: Payer: Self-pay | Admitting: Oncology

## 2015-01-13 DIAGNOSIS — R160 Hepatomegaly, not elsewhere classified: Secondary | ICD-10-CM

## 2015-01-13 DIAGNOSIS — C50422 Malignant neoplasm of upper-outer quadrant of left male breast: Secondary | ICD-10-CM

## 2015-01-13 DIAGNOSIS — C49A0 Gastrointestinal stromal tumor, unspecified site: Secondary | ICD-10-CM

## 2015-01-13 NOTE — Telephone Encounter (Signed)
Called and left message with new dr Corinna Capra left a message that the ct bx cannot be done at [facility] as they do not do biopsys,i also left a note for dr Marko Plume to advise on moveing order to [facility]

## 2015-01-13 NOTE — Telephone Encounter (Signed)
New ct bx order noted for central scheduling to call

## 2015-01-17 ENCOUNTER — Ambulatory Visit: Payer: Self-pay

## 2015-01-17 ENCOUNTER — Ambulatory Visit: Payer: Self-pay | Admitting: Oncology

## 2015-01-20 ENCOUNTER — Other Ambulatory Visit: Payer: Self-pay | Admitting: Radiology

## 2015-01-21 ENCOUNTER — Ambulatory Visit (HOSPITAL_COMMUNITY)
Admission: RE | Admit: 2015-01-21 | Discharge: 2015-01-21 | Disposition: A | Discharge status: Home / Self Care | Payer: BLUE CROSS/BLUE SHIELD | Source: Ambulatory Visit | Attending: Oncology | Admitting: Oncology

## 2015-01-21 ENCOUNTER — Encounter (HOSPITAL_COMMUNITY): Payer: Self-pay

## 2015-01-21 DIAGNOSIS — R160 Hepatomegaly, not elsewhere classified: Secondary | ICD-10-CM | POA: Insufficient documentation

## 2015-01-21 DIAGNOSIS — C49A0 Gastrointestinal stromal tumor, unspecified site: Secondary | ICD-10-CM

## 2015-01-21 DIAGNOSIS — C787 Secondary malignant neoplasm of liver and intrahepatic bile duct: Secondary | ICD-10-CM | POA: Insufficient documentation

## 2015-01-21 DIAGNOSIS — C50422 Malignant neoplasm of upper-outer quadrant of left male breast: Secondary | ICD-10-CM

## 2015-01-21 LAB — CBC
HCT: 42.8 % (ref 39.0–52.0)
HEMOGLOBIN: 13.8 g/dL (ref 13.0–17.0)
MCH: 30.5 pg (ref 26.0–34.0)
MCHC: 32.2 g/dL (ref 30.0–36.0)
MCV: 94.5 fL (ref 78.0–100.0)
PLATELETS: 264 10*3/uL (ref 150–400)
RBC: 4.53 MIL/uL (ref 4.22–5.81)
RDW: 14.1 % (ref 11.5–15.5)
WBC: 8 10*3/uL (ref 4.0–10.5)

## 2015-01-21 LAB — PROTIME-INR
INR: 0.99 (ref 0.00–1.49)
Prothrombin Time: 13.2 seconds (ref 11.6–15.2)

## 2015-01-21 LAB — APTT: APTT: 28 s (ref 24–37)

## 2015-01-21 MED ORDER — FLUMAZENIL 0.5 MG/5ML IV SOLN
INTRAVENOUS | Status: AC
  Filled 2015-01-21: qty 5

## 2015-01-21 MED ORDER — FENTANYL CITRATE 0.05 MG/ML IJ SOLN
INTRAMUSCULAR | Status: AC
  Filled 2015-01-21: qty 4

## 2015-01-21 MED ORDER — SODIUM CHLORIDE 0.9 % IV SOLN
Freq: Once | INTRAVENOUS | Status: AC
  Administered 2015-01-21: 12:00:00 via INTRAVENOUS

## 2015-01-21 MED ORDER — NALOXONE HCL 0.4 MG/ML IJ SOLN
INTRAMUSCULAR | Status: AC
  Filled 2015-01-21: qty 1

## 2015-01-21 MED ORDER — MIDAZOLAM HCL 2 MG/2ML IJ SOLN
INTRAMUSCULAR | Status: AC
  Filled 2015-01-21: qty 6

## 2015-01-21 MED ORDER — FENTANYL CITRATE 0.05 MG/ML IJ SOLN
INTRAMUSCULAR | Status: AC | PRN
  Administered 2015-01-21 (×2): 25 ug via INTRAVENOUS

## 2015-01-21 MED ORDER — MIDAZOLAM HCL 2 MG/2ML IJ SOLN
INTRAMUSCULAR | Status: AC | PRN
  Administered 2015-01-21: 1 mg via INTRAVENOUS
  Administered 2015-01-21: 0.5 mg via INTRAVENOUS

## 2015-01-21 NOTE — Procedures (Signed)
Successful CENTRAL RT LIVER MASS CORE BX NO COMP STABLE FULL REPORT IN PACS PATH PENDING

## 2015-01-21 NOTE — H&P (Signed)
Chief Complaint: "I'm here to get a liver biopsy"  Referring Physician(s): Livesay,Lennis P  History of Present Illness: Henry [name] is a 64 y.o. 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 Restaging CT of the chest abdomen pelvis performed on 12/30/14 revealed a new enhancing lesion in the right hepatic lobe. He presents today for ultrasound-guided biopsy of this right hepatic lobe lesion.  Past Medical History  Diagnosis Date  . 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 Pneumothorax, spontaneous, tension 1992    TREATED WITH A CHEST TUBE  . Cancer NOVEMBER 2008    T1a PAPILLARY RENAL CELL CARCINOMA  . B12 nutritional deficiency     RELATED TO PREVIOUS SURGRIES/ ON MONTHLY SUPPLEMENT  . Depression NOTED IN NOTE 05-28-11    HISTORY OF MANIC  DEPRESSIVE TYPE DISORDER  . Anemia 2008    RESOLVED  WITH IV IRON DEXTRAN AS HE WAS UNABLE  TO ABSORB  ORAL IRON.    . Breast cancer     Past Surgical History  Procedure Laterality Date  . Bilroth ii procedure  12-31-2005  . Cholecystectomy  12-31-2005    DONE WITH GASTRECTOMY BY DR. Marcello Moores CORNETT  . Partial nephrectomy  NOVEMBER 2008    RIGHT SIDE/ FOR T1a PAPILLARY RENAL CELL CARCINOMA  . Tumor removal  MARCH 2011    FOR RECURRENT GIST BY DR. Clovis Riley AT [facility]  . Re-excision of breast lumpectomy Left 10/14/2013    Procedure: RE-EXCISION OF BREAST LUMPECTOMY;  Surgeon: Marcello Moores A. Cornett, MD;  Location: [place];  Service: General;  Laterality: Left;  . Axillary sentinel node biopsy Left 10/14/2013    Procedure: sentinel lymph node mapping ;  Surgeon: Marcello Moores A. Cornett, MD;  Location: [place];  Service: General;  Laterality: Left;    Allergies: Feraheme  Medications: Prior to Admission medications     Medication Sig Start Date End Date Taking? Authorizing Provider  Cyanocobalamin (VITAMIN B-12 IJ) Inject 1,000 mcg as directed every 30 (thirty) days. RECEIVES INJECTION AT CANCER CENTER  08/07/11  Yes Lennis Marion Downer, MD  imatinib (GLEEVEC) 100 MG tablet Take 1 tablet (100 mg total) by mouth daily. Take with meals and large glass of water.Caution:Chemotherapy 09/27/14  Yes Lennis Marion Downer, MD  tamoxifen (NOLVADEX) 10 MG tablet Take 1 tablet (10 mg total) by mouth 2 (two) times daily. 09/27/14  Yes Lennis Marion Downer, MD    Family History  Problem Relation Age of Onset  . Ovarian cancer Mother   . Breast cancer Mother   . Brain cancer Cousin 2    maternal cousin  . Brain cancer Cousin     paternal cousin, dx in his 28s    History   Social History  . Marital Status: Widowed    Spouse Name: N/A  . Number of Children: 0  . Years of Education: N/A   Occupational History  . OWNER    Social History Main Topics  . Smoking status: Former Smoker -- 1.50 packs/day for 25 years    Quit date: 07/03/1989  . Smokeless tobacco: Not on file  . Alcohol Use: No  . Drug Use: No  . Sexual Activity: Not on file   Other Topics Concern  . None   Social History Narrative  Review of Systems  Constitutional: Negative for fever and chills.  Respiratory: Negative for cough and shortness of breath.   Cardiovascular: Negative for chest pain.  Gastrointestinal: Negative for nausea, vomiting, abdominal pain and blood in stool.  Genitourinary: Negative for dysuria and hematuria.  Musculoskeletal: Negative for back pain.  Neurological: Negative for headaches.       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  Thin WM in NAD  Cardiovascular: Normal rate and regular rhythm.   Pulmonary/Chest: Effort normal and breath sounds normal.   Abdominal: Soft. Bowel sounds are normal. There is no tenderness.  Musculoskeletal: Normal range of motion. He exhibits no edema.  Neurological: He is alert and oriented to person, place, and time.    Imaging: Ct Chest W Contrast  12/30/2014   CLINICAL DATA:  Restaging GI ST. Diagnosis 2007. Additional history of right renal cell carcinoma 2008 with partial nephrectomy. Additional history of left breast cancer 2014.  EXAM: CT CHEST, ABDOMEN, AND PELVIS WITH CONTRAST  TECHNIQUE: Multidetector CT imaging of the chest, abdomen and pelvis was performed following the standard protocol during bolus administration of intravenous contrast.  CONTRAST:  100 mL Isovue  COMPARISON:  CT 08/28/2013, PET-CT 11/11/2009  FINDINGS: CT CHEST FINDINGS  Mediastinum/Nodes: No axillary supraclavicular lymphadenopathy. No mediastinal hilar lymphadenopathy.  Lungs/Pleura: 3 mm right middle lobe pulmonary nodule stable (image 43, series 4). 2 mm right middle lobe nodule is also unchanged on image 38. No new pulmonary nodules.  CT ABDOMEN AND PELVIS FINDINGS  Hepatobiliary: New mixed density lesion in right hepatic lobe measures 24 x 20 mm (image 66, series 3). No biliary duct dilatation. Post cholecystectomy. Post partial hepatectomy of the left hepatic lobe.  Pancreas: The common bile duct is normal caliber following cholecystectomy. Pancreas and pancreatic duct are normal.  Spleen: Spleen is very small.  Adrenals/Urinary Tract: Adrenal glands and kidneys are normal. Bladder is distended.  Stomach/Bowel: Stomach, small bowel, colon are normal. Little intra-abdominal fat does make evaluation of the peritoneal space difficult.  Vascular/Lymphatic: Although or is normal caliber. No retroperitoneal periportal lymphadenopathy.  Reproductive: Prostate gland is normal.  No pelvic lymphadenopathy.  Other: No omental or peritoneal disease.  Musculoskeletal: No aggressive osseous lesion.  IMPRESSION: Chest Impression:  No evidence of  thoracic metastasis.  Abdomen / Pelvis Impression:  1. NEW enhancing lesion in the right hepatic lobe is most consistent with a new metastatic lesion. Consider percutaneous biopsy. 2. No intra-abdominal mass in the peritoneal space. 3. Small spleen again demonstrated. 4. Mild bladder distension. These results will be called to the ordering clinician or representative by the Radiologist Assistant, and communication documented in the PACS or zVision Dashboard.   Electronically Signed   By: Suzy Bouchard M.D.   On: 12/30/2014 17:59   Ct Abdomen Pelvis W Contrast  12/30/2014   CLINICAL DATA:  Restaging GI ST. Diagnosis 2007. Additional history of right renal cell carcinoma 2008 with partial nephrectomy. Additional history of left breast cancer 2014.  EXAM: CT CHEST, ABDOMEN, AND PELVIS WITH CONTRAST  TECHNIQUE: Multidetector CT imaging of the chest, abdomen and pelvis was performed following the standard protocol during bolus administration of intravenous contrast.  CONTRAST:  100 mL Isovue  COMPARISON:  CT 08/28/2013, PET-CT 11/11/2009  FINDINGS: CT CHEST FINDINGS  Mediastinum/Nodes: No axillary supraclavicular lymphadenopathy. No  mediastinal hilar lymphadenopathy.  Lungs/Pleura: 3 mm right middle lobe pulmonary nodule stable (image 43, series 4). 2 mm right middle lobe nodule is also unchanged on image 38. No new pulmonary nodules.  CT ABDOMEN AND PELVIS FINDINGS  Hepatobiliary: New mixed density lesion in right hepatic lobe measures 24 x 20 mm (image 66, series 3). No biliary duct dilatation. Post cholecystectomy. Post partial hepatectomy of the left hepatic lobe.  Pancreas: The common bile duct is normal caliber following cholecystectomy. Pancreas and pancreatic duct are normal.  Spleen: Spleen is very small.  Adrenals/Urinary Tract: Adrenal glands and kidneys are normal. Bladder is distended.  Stomach/Bowel: Stomach, small bowel, colon are normal. Little intra-abdominal fat does make evaluation of the  peritoneal space difficult.  Vascular/Lymphatic: Although or is normal caliber. No retroperitoneal periportal lymphadenopathy.  Reproductive: Prostate gland is normal.  No pelvic lymphadenopathy.  Other: No omental or peritoneal disease.  Musculoskeletal: No aggressive osseous lesion.  IMPRESSION: Chest Impression:  No evidence of thoracic metastasis.  Abdomen / Pelvis Impression:  1. NEW enhancing lesion in the right hepatic lobe is most consistent with a new metastatic lesion. Consider percutaneous biopsy. 2. No intra-abdominal mass in the peritoneal space. 3. Small spleen again demonstrated. 4. Mild bladder distension. These results will be called to the ordering clinician or representative by the Radiologist Assistant, and communication documented in the PACS or zVision Dashboard.   Electronically Signed   By: Suzy Bouchard M.D.   On: 12/30/2014 17:59    Labs:  CBC:  Recent Labs  04/21/14 1505 08/11/14 1423 01/10/15 1409 01/21/15 1130  WBC 4.7 7.5 7.6 8.0  HGB 11.1* 13.0 13.6 13.8  HCT 35.2* 40.0 42.1 42.8  PLT 320 228 231 264    COAGS: No results for input(s): INR, APTT in the last 8760 hours.  BMP:  Recent Labs  04/21/14 1505 08/11/14 1424 12/15/14 1022 01/10/15 1409  NA 144 145 145 143  K 3.8 4.1 3.7 3.9  CO2 27 27 26 27   GLUCOSE 107 124 141* 99  BUN 10.1 14.2 9.9 7.0  CALCIUM 8.7 9.3 8.5 8.4  CREATININE 1.0 1.1 0.9 0.8    LIVER FUNCTION TESTS:  Recent Labs  02/08/14 1532 04/21/14 1505 08/11/14 1424 01/10/15 1409  BILITOT 1.17 1.31* 1.07 2.06*  AST 18 19 20 15   ALT 10 14 22 12   ALKPHOS 28* 34* 31* 26*  PROT 6.5 6.7 6.3* 6.3*  ALBUMIN 3.6 3.6 3.5 3.6    TUMOR MARKERS: No results for input(s): AFPTM, CEA, CA199, CHROMGRNA in the last 8760 hours.  Assessment and Plan: Henry [name] is a 64 y.o. 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 Restaging CT of the chest abdomen pelvis performed on 12/30/14 revealed a new enhancing lesion in the right hepatic lobe. He presents today for ultrasound-guided biopsy of this right hepatic lobe lesion.Risks and benefits discussed with the patient including, but not limited to bleeding, infection, damage to adjacent structures or low yield requiring additional tests. All of the patient's questions were answered, patient is agreeable to proceed. Consent signed and in chart.        Signed: Autumn Messing 01/21/2015, 12:20 PM   I spent a total of 20 minutes face to face in clinical consultation, greater than 50% of which was counseling/coordinating care for ultrasound-guided right hepatic lobe lesion biopsy.

## 2015-01-21 NOTE — Discharge Instructions (Signed)
Conscious Sedation Sedation is the use of medicines to promote relaxation and relieve discomfort and anxiety. Conscious sedation is a type of sedation. Under conscious sedation you are less alert than normal but are still able to respond to instructions or stimulation. Conscious sedation is used during short medical and dental procedures. It is milder than deep sedation or general anesthesia and allows you to return to your regular activities sooner.  LET [facility] CARE PROVIDER KNOW ABOUT:   Any allergies you have.  All medicines you are taking, including vitamins, herbs, eye drops, creams, and over-the-counter medicines.  Use of steroids (by mouth or creams).  Previous problems you or members of your family have had with the use of anesthetics.  Any blood disorders you have.  Previous surgeries you have had.  Medical conditions you have.  Possibility of pregnancy, if this applies.  Use of cigarettes, alcohol, or illegal drugs. RISKS AND COMPLICATIONS Generally, this is a safe procedure. However, as with any procedure, problems can occur. Possible problems include:  Oversedation.  Trouble breathing on your own. You may need to have a breathing tube until you are awake and breathing on your own.  Allergic reaction to any of the medicines used for the procedure. BEFORE THE PROCEDURE  You may have blood tests done. These tests can help show how well your kidneys and liver are working. They can also show how well your blood clots.  A physical exam will be done.  Only take medicines as directed by your health care provider. You may need to stop taking medicines (such as blood thinners, aspirin, or nonsteroidal anti-inflammatory drugs) before the procedure.   Do not eat or drink at least 6 hours before the procedure or as directed by your health care provider.  Arrange for a responsible adult, family member, or friend to take you home after the procedure. He or she should stay  with you for at least 24 hours after the procedure, until the medicine has worn off. PROCEDURE   An intravenous (IV) catheter will be inserted into one of your veins. Medicine will be able to flow directly into your body through this catheter. You may be given medicine through this tube to help prevent pain and help you relax.  The medical or dental procedure will be done. AFTER THE PROCEDURE  You will stay in a recovery area until the medicine has worn off. Your blood pressure and pulse will be checked.   Depending on the procedure you had, you may be allowed to go home when you can tolerate liquids and your pain is under control. Document Released: 07/03/2001 Document Revised: 10/13/2013 Document Reviewed: 06/15/2013 [facility] Patient Information 2015 [place], [place]. This information is not intended to replace advice given to you by your health care provider. Make sure you discuss any questions you have with your health care provider. Liver Biopsy The liver is a large organ in the upper right-hand side of your abdomen. A liver biopsy is a procedure in which a tissue sample is taken from the liver and examined under a microscope. The procedure is done to confirm a suspected problem. There are three types of liver biopsies:  Percutaneous. In this type, an incision is made in your abdomen. The sample is removed through the incision with a needle.  Laparoscopic. In this type, several incisions are made in the abdomen. A tiny camera is passed through one of the incisions to help guide the health care provider. The sample is removed through  the other incision or incisions.  Transjugular. In this type, an incision is made in the neck. A tube is passed through the incision to the liver. The sample is removed through the tube with a needle. LET [facility] CARE PROVIDER KNOW ABOUT:  Any allergies you have.  All medicines you are taking, including vitamins, herbs, eye drops, creams, and  over-the-counter medicines.  Previous problems you or members of your family have had with the use of anesthetics.  Any blood disorders you have.  Previous surgeries you have had.  Medical conditions you have.  Possibility of pregnancy, if this applies. RISKS AND COMPLICATIONS Generally, this is a safe procedure. However, problems can occur and include:  Bleeding.  Infection.  Bruising.  Collapsed lung.  Leak of digestive juices (bile) from the liver or gallbladder.  Problems with heart rhythm.  Pain at the biopsy site or in the right shoulder.  Low blood pressure (hypotension).  Injury to nearby organs or tissues. BEFORE THE PROCEDURE  Your health care provider may do some blood or urine tests. These will help your health care provider learn how well your kidneys and liver are working and how well your blood clots.  Ask your health care provider if you will be able to go home the day of the procedure. Arrange for someone to take you home and stay with you for at least 24 hours.  Do not eat or drink anything after midnight on the night before the procedure or as directed by your health care provider.  Ask your health care provider about:  Changing or stopping your regular medicines. This is especially important if you are taking diabetes medicines or blood thinners.  Taking medicines such as aspirin and ibuprofen. These medicines can thin your blood. Do not take these medicines before your procedure if your health care provider asks you not to. PROCEDURE Regardless of the type of biopsy that will be done, you will have an IV line placed. Through this line, you will receive fluids and medicine to relax you. If you will be having a laparoscopic biopsy, you may also receive medicine through this line to make you sleep during the procedure (general anesthetic). Percutaneous Liver Biopsy  You will positioned on your back, with your right hand over your head.  A health  care provider will locate your liver by tapping and pressing on the right side of your abdomen or with the help of an ultrasound machine or CT scan.  An area at the bottom of your last right rib will be numbed.  An incision will be made in the numbed area.  The biopsy needle will be inserted into the incision.  Several samples of liver tissue will be taken with the biopsy needle. You will be asked to hold your breath as each sample is taken. Laparoscopic Liver Biopsy  You will be positioned on your back.  Several small incisions will be made in your abdomen.  Your doctor will pass a tiny camera through one incision. The camera will allow the liver to be viewed on a TV monitor in the operating room.  Tools will be passed through the other incision or incisions. These tools will be used to remove samples of liver tissue. Transjugular Liver Biopsy  You will be positioned on your back on an X-ray table, with your head turned to your left.  An area on your neck just over your jugular vein will be numbed.  An incision will be made in the  numbed area.  A tiny tube will be inserted through the incision. It will be pushed through the jugular vein to a blood vessel in the liver called the hepatic vein.  Dye will be inserted through the tube, and X-rays will be taken. The dye will make the blood vessels in the liver light up on the X-rays.  The biopsy needle will be pushed through the tube until it reaches the liver.  Samples of liver tissue will be taken with the biopsy needle.  The needle and the tube will be removed. After the samples are obtained, the incision or incisions will be closed. AFTER THE PROCEDURE  You will be taken to a recovery area.  You may have to lie on your right side for 1-2 hours. This will prevent bleeding from the biopsy site.  Your progress will be watched. Your blood pressure, pulse, and the biopsy site will be checked often.  You may have some pain or  feel sick. If this happens, tell your health care provider.  As you begin to feel better, you will be offered ice and beverages.  You may be allowed to go home when the medicines have worn off and you can walk, drink, eat, and use the bathroom. Document Released: 12/29/2003 Document Revised: 02/22/2014 Document Reviewed: 12/04/2013 [facility] Patient Information 2015 [place], [place]. This information is not intended to replace advice given to you by your health care provider. Make sure you discuss any questions you have with your health care provider. Liver Biopsy, Care After Refer to this sheet in the next few weeks. These instructions provide you with information on caring for yourself after your procedure. Your health care provider may also give you more specific instructions. Your treatment has been planned according to current medical practices, but problems sometimes occur. Call your health care provider if you have any problems or questions after your procedure. WHAT TO EXPECT AFTER THE PROCEDURE After your procedure, it is typical to have the following:  A small amount of discomfort in the area where the biopsy was done and in the right shoulder or shoulder blade.  A small amount of bruising around the area where the biopsy was done and on the skin over the liver.  Sleepiness and fatigue for the rest of the day. HOME CARE INSTRUCTIONS   Rest at home for 1-2 days or as directed by your health care provider.  Have a friend or family member stay with you for at least 24 hours.  Because of the medicines used during the procedure, you should not do the following things in the first 24 hours:  Drive.  Use machinery.  Be responsible for the care of other people.  Sign legal documents.  Take a bath or shower.  There are many different ways to close and cover an incision, including stitches, skin glue, and adhesive strips. Follow your health care provider's instructions  on:  Incision care.  Bandage (dressing) changes and removal.  Incision closure removal.  Do not drink alcohol in the first week.  Do not lift more than 5 pounds or play contact sports for 2 weeks after this test.  Take medicines only as directed by your health care provider. Do not take medicine containing aspirin or non-steroidal anti-inflammatory medicines such as ibuprofen for 1 week after this test.  It is your responsibility to get your test results. SEEK MEDICAL CARE IF:   You have increased bleeding from an incision that results in more than a small spot  of blood.  You have redness, swelling, or increasing pain in any incisions.  You notice a discharge or a bad smell coming from any of your incisions.  You have a fever or chills. SEEK IMMEDIATE MEDICAL CARE IF:   You develop swelling, bloating, or pain in your abdomen.  You become dizzy or faint.  You develop a rash.  You are nauseous or vomit.  You have difficulty breathing, feel short of breath, or feel faint.  You develop chest pain.  You have problems with your speech or vision.  You have trouble balancing or moving your arms or legs. Document Released: 04/27/2005 Document Revised: 02/22/2014 Document Reviewed: 12/04/2013 [facility] Patient Information 2015 [place], [place]. This information is not intended to replace advice given to you by your health care provider. Make sure you discuss any questions you have with your health care provider.

## 2015-01-22 ENCOUNTER — Other Ambulatory Visit: Payer: Self-pay | Admitting: Oncology

## 2015-01-24 ENCOUNTER — Ambulatory Visit: Payer: BLUE CROSS/BLUE SHIELD

## 2015-01-26 ENCOUNTER — Other Ambulatory Visit: Payer: Self-pay

## 2015-01-26 DIAGNOSIS — C49A0 Gastrointestinal stromal tumor, unspecified site: Secondary | ICD-10-CM

## 2015-01-27 ENCOUNTER — Other Ambulatory Visit (HOSPITAL_BASED_OUTPATIENT_CLINIC_OR_DEPARTMENT_OTHER): Payer: BLUE CROSS/BLUE SHIELD

## 2015-01-27 ENCOUNTER — Ambulatory Visit (HOSPITAL_BASED_OUTPATIENT_CLINIC_OR_DEPARTMENT_OTHER): Payer: BLUE CROSS/BLUE SHIELD | Admitting: Oncology

## 2015-01-27 ENCOUNTER — Ambulatory Visit: Payer: BLUE CROSS/BLUE SHIELD

## 2015-01-27 ENCOUNTER — Encounter: Payer: Self-pay | Admitting: Oncology

## 2015-01-27 VITALS — BP 156/80 | HR 103 | Temp 98.1°F | Resp 18 | Ht 72.0 in | Wt 130.5 lb

## 2015-01-27 DIAGNOSIS — E538 Deficiency of other specified B group vitamins: Secondary | ICD-10-CM

## 2015-01-27 DIAGNOSIS — D519 Vitamin B12 deficiency anemia, unspecified: Secondary | ICD-10-CM

## 2015-01-27 DIAGNOSIS — C50422 Malignant neoplasm of upper-outer quadrant of left male breast: Secondary | ICD-10-CM

## 2015-01-27 DIAGNOSIS — C499 Malignant neoplasm of connective and soft tissue, unspecified: Secondary | ICD-10-CM

## 2015-01-27 DIAGNOSIS — C494 Malignant neoplasm of connective and soft tissue of abdomen: Secondary | ICD-10-CM | POA: Diagnosis not present

## 2015-01-27 DIAGNOSIS — C49A0 Gastrointestinal stromal tumor, unspecified site: Secondary | ICD-10-CM

## 2015-01-27 DIAGNOSIS — D508 Other iron deficiency anemias: Secondary | ICD-10-CM

## 2015-01-27 LAB — COMPREHENSIVE METABOLIC PANEL (CC13)
ALT: 10 U/L (ref 0–55)
AST: 14 U/L (ref 5–34)
Albumin: 3.4 g/dL — ABNORMAL LOW (ref 3.5–5.0)
Alkaline Phosphatase: 30 U/L — ABNORMAL LOW (ref 40–150)
Anion Gap: 12 mEq/L — ABNORMAL HIGH (ref 3–11)
BILIRUBIN TOTAL: 1.56 mg/dL — AB (ref 0.20–1.20)
BUN: 8.6 mg/dL (ref 7.0–26.0)
CHLORIDE: 108 meq/L (ref 98–109)
CO2: 25 meq/L (ref 22–29)
CREATININE: 0.9 mg/dL (ref 0.7–1.3)
Calcium: 8.3 mg/dL — ABNORMAL LOW (ref 8.4–10.4)
Glucose: 166 mg/dl — ABNORMAL HIGH (ref 70–140)
Potassium: 3.3 mEq/L — ABNORMAL LOW (ref 3.5–5.1)
Sodium: 145 mEq/L (ref 136–145)
Total Protein: 6.1 g/dL — ABNORMAL LOW (ref 6.4–8.3)

## 2015-01-27 MED ORDER — CYANOCOBALAMIN 1000 MCG/ML IJ SOLN
1000.0000 ug | Freq: Once | INTRAMUSCULAR | Status: AC
  Administered 2015-01-27: 1000 ug via INTRAMUSCULAR

## 2015-01-27 NOTE — Progress Notes (Signed)
OFFICE PROGRESS NOTE   January 27, 2015   Physicians:Y.Lowne, E.Levine, P.Savage, T.Cornett, S.Dahlstedt, J.Perry, F.Lupton  INTERVAL HISTORY:  Patient is seen, alone for visit, having had US biopsy of single liver lesion done 06-23-94, without complications. 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 Prior to today's visit, I have discussed case with my two partners in GI oncology and communicated with Dr Clovis Riley at [facility].  The CT CAP 12-30-14 in [facility] was first since 08-2013, due to insurance and financial issues. The CT showed stable 3 mm and 2 mm right middle lobe lung nodules, no axillary or mediastinal adenopathy, the new lesion right hepatic lobe 2.4 x 2 cm and no other findings of concern for metastatic or recurrent malignancy.   Note he was off of [place] due to insurance issues in spring 2014 and again in early 2015; patient tells me now that he may have been off of [place] for 5 months. His Gleevec dose otherwise has been 100 mg daily since June 2012, previously hepatic toxicity at full dose in 2007 and increased LFT when on 200 mg daily in 2011. For the past week he has increased Gleevec to 100 mg bid.  Patient continues to feel well, with no discomfort following the US biopsy and otherwise no symptoms related to this progression or to his other oncologic diagnoses. He cannot tell any problems with the higher Gleevec dose. Energy and appetite are good.      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 He has been on tamoxifen since 10-2013, given away from gleevec doses due to possible CYP3A4 interaction decreasing active tamoxifen metabolite endoxifen. He has clinically benign, tiny nodes at left axillary scar. Genetics testing negative 01-2014.  He is on monthly B12 since GI resections for GIST. He had IV iron as Infed last 07-2014.    No PAC Genetics testing negative 01-2014   ONCOLOGIC HISTORY LEFT BREAST CANCER  Patient had been aware of palpable mass adjacent to left areola for possibly 2-3 months, which had not changed that he could tell during that time. He had bilateral mammograms and left US done at [facility] on 08-28-13, then excisional biopsy by Dr Brantley Stage on 09-23-13. 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 He had re-excision with evaluation of sentinel nodes on 10-14-13 ([id number]), with no residual malignancy in the specimen and 6 sentinel nodes negative.  MDBC consensus was that extent of surgery was felt to be adequate and radiation oncology did not recommend additional radiation therapy; genetics testing was negative 01-2014 and bilateral mastectomies not recommended. He began tamoxifen 10-20-14, taking doses away from gleevec in attempt to minimize possible decrease in active endoxifen metabolite of tamoxifen.  GIST was initially disgnosed 2007, with subtotal gastrectomy with Roux-en-Y, then briefly on study with [place] which he did not tolerate due to marked elevations in LFTs. He had recurrent GIST in late 2012 with further surgery by Dr.Levine March 2011, then rapid elevation of LFTs with 50% dosing (200 mg/d) gleevec, with gleevec again discontinued after short period in 2011. He had further progression in June 2012, did tolerate gleevec at 174m/d with initial prednisone, with adequate shrinkage of tumor to allow surgery by Dr. LClovis RileyDec 6, 2012. At laparotomy he had extensive adhesions but no gross peritoneal, hepatic or bowel involvement.  The mass itself was cystic and soft and not densely adherent to any visceral structures. Pathology([facility] [phone]) recurrent GIST 8.9 cm with resection margins not involved and comment that microscopically this exhibits an epithelial and glandular growth pattern which differs from previous  specimens; nuclei medium-sized and bland; immunohistochemical panel postive for c-kit. No mitotic index is reported. He saw Dr.Levine last in Dec 2012. He had CT CAP in [facility] system 08-28-13, and  PET Jan 2011. He subsequently did not have scans until 12-2014 due to insurance constraints. CT CAP 12-30-14 had new 2.4 x 2 cm lesion in right hepatic lobe, GIST by US biopsy 01-21-15. Henry [name] has needed B12 injections due to gastric/ bowel resection. He was iron deficient related to surgeries and blood draws, previously had marked skin reaction to feraheme, but did tolerate inferon on 12-10-2011 and 07-26-2014 with no difficulty.   RENAL CANCER history of T1a renal cell carcinoma, treated with partial right nephrectomy in 2008 and not active since then.     Review of systems as above, also: No pain. No fever or symptoms of infection. No respiratory or musculoskeletal complaints. No changes in breasts bilaterally. No bleeding. Remainder of 10 point Review of Systems negative.  Objective:  Vital signs in last 24 hours:  BP 156/80 mmHg  Pulse 103  Temp(Src) 98.1 F (36.7 C) (Oral)  Resp 18  Ht 6' (1.829 m)  Wt 130 lb 8 oz (59.194 kg)  BMI 17.69 kg/m2  SpO2 97% Weight up 2 lbs Alert, oriented and appropriate. Ambulatory without difficulty.    HEENT:PERRL, sclerae not icteric. Oral mucosa moist without lesions, posterior pharynx clear.  Neck supple. No JVD.  Lymphatics:no cervical,surpaclavicular, right axillary adenopathy. At left axillary scar unchanged 3 tiny mobile nodes, not firm, up to 0.5 cm diameter. Resp: clear to auscultation bilaterally and normal percussion bilaterally Cardio: regular rate and rhythm. No gallop. GI: soft, nontender, not distended, no mass or organomegaly. Normally active bowel sounds. Surgical incision not remarkable. Musculoskeletal/ Extremities:UE and LE without pitting edema, cords, tenderness Neuro: no peripheral neuropathy. Otherwise nonfocal Skin  without rash, ecchymosis, petechiae. No bruising at biopsy site for liver lesion Breasts: right without dominant mass, skin or nipple findings. Left excisional biopsy scar well healed without evidence of local recurrence. Right axilla not remarkable, left axilla as above.   Lab Results:  Results for orders placed or performed in visit on 01/27/15  Comprehensive metabolic panel (Cmet) - [facility]  Result Value Ref Range   Sodium 145 136 - 145 mEq/L   Potassium 3.3 (L) 3.5 - 5.1 mEq/L   Chloride 108 98 - 109 mEq/L   CO2 25 22 - 29 mEq/L   Glucose 166 (H) 70 - 140 mg/dl   BUN 8.6 7.0 - 26.0 mg/dL   Creatinine 0.9 0.7 - 1.3 mg/dL   Total Bilirubin 1.56 (H) 0.20 - 1.20 mg/dL   Alkaline Phosphatase 30 (L) 40 - 150 U/L   AST 14 5 - 34 U/L   ALT 10 0 - 55 U/L   Total Protein 6.1 (L) 6.4 - 8.3 g/dL   Albumin 3.4 (L) 3.5 - 5.0 g/dL   Calcium 8.3 (L) 8.4 - 10.4 mg/dL   Anion Gap 12 (H) 3 - 11 mEq/L   EGFR >90 >90 ml/min/1.73 m2    PTT  28 on 01-21-15  Studies/Results:     Study Result     CLINICAL DATA: History of gastrointestinal stromal tumor, male breast cancer, remote renal cell carcinoma. New central  right hepatic mass concerning for metastasis.  EXAM: ULTRASOUND GUIDED CORE BIOPSY OF RIGHT HEPATIC MASS   01-21-15  MEDICATIONS: 1.5 mg IV Versed; 50 mcg IV Fentanyl  Total Moderate Sedation Time: 15 MINUTES  PROCEDURE: The procedure, risks, benefits, and alternatives were explained to the patient. Questions regarding the procedure were encouraged and answered. The patient understands and consents to the procedure.  The right upper quadrant was prepped with Betadine in a sterile fashion, and a sterile drape was applied covering the operative field. A sterile gown and sterile gloves were used for the procedure. Local anesthesia was provided with 1% Lidocaine.  Previous imaging reviewed. Patient positioned right anterior oblique. Preliminary ultrasound performed through a  lower intercostal space in the mid axillary line. The echogenic solid central liver mass was localized. Under sterile conditions and local anesthesia, a 17 gauge 6.8 cm access needle was advanced percutaneously into the lesion. Needle position confirmed with ultrasound. 5 18 gauge core biopsies obtained. Samples placed in formalin. Needle removed. No immediate complication. Patient tolerated the biopsy well.  COMPLICATIONS: None.  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 PLEASE SEE COMMENT, Microscopic Comment There are sheets of spindle and epithelioid cell proliferation. Immunostains were performed and the tumor cells are strongly positive for CD117, patchy positive for CD34, negative for S-100, synaptophysin, CK AE1/AE3, CDX-2, TTF-1, chromogranin with appropriate controls. 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    Medications: I have reviewed the patient's current medications. He would like to try to increase Gleevec further if able to tolerate; new prescription to be requested for 100 mg tablets 3 daily.   DISCUSSION: pathology findings reviewed now. We have discussed usefulness of increasing Gleevec dose with progressive disease,  typically be from usual first line dosing of 400 mg/day up to 800 mg/day. LFTs today are stable in good range and certainly we can follow these closely with higher dose. He is glad to see Dr Clovis Riley, understands that some ablation procedure, possibly SRS, may  be recommended instead of resection of the hepatic area.  I have spoken with Dr Georgina Snell clinic directly following visit today, appointment made for his next available 02-16-15 @ 10:00, 4th floor cancer center, fax [phone].  Assessment/Plan:  1.Progression of GIST, now with single 2.4 x 2 cm right hepatic lesion confirmed by biopsy: increase Gleevec following LFTs, consultation with Dr Clovis Riley. 2.GIST: initially diagnosed 2007 with subtotal gastrectomy with Roux-en-Y, then briefly on study with [place] which he did not tolerate due to marked elevations in LFTs. Recurrent GIST with surgery by Dr Clovis Riley March 2011, then rapid elevation of LFTs with 50% dose reduction of Gleevec. Further progression June 2012, tolerated gleevec at 100 mg daily with initial steroids, further surgery Dec 2012. 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 Nodularity in left anterior axillaappeared scarring + non-enlarged lymph nodes. Continue tamoxifen, spacing doses out from [place] due to potential decrease in active tamoxifen metabolite. 4.T1a renal cell carcinoma treated with right partial nephrectomy 2008, not recurrent  5.B12 deficiency due to gastrectomy, on monthly B12 . Given 01-27-15. 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 Hemoglobin in good range today 7.long past tobacco. Stable tiny lung nodules, no other pulmonary findings of concern on CT  8.cataracts: recently diagnosis, may be increasing more quickly on tamoxifen.  9.atherosclerotic changes in aorta  10.difficult social situation: now lives ~ [place] hour from [place]. Insurance concerns. 11.reported hx manic depressive illness, tho I have never been clear about that diagnosis 12.increased voiding late in day:PSA  10-2013 was 0.6, prostate ok on CT 13.low weight: limited po intake since nearly total gastrectomy   Patient comfortable with discussion, recommendations and plans. Copy of path report given. We will be in touch with him re appointments from here. He knows to call if questions or concerns. Time spent 30 min including >50% counseling and coordination of care.   Alis Sawchuk P, Henry [name]   01/27/2015, 4:15 PM

## 2015-01-28 ENCOUNTER — Telehealth: Payer: Self-pay | Admitting: Oncology

## 2015-01-28 NOTE — Telephone Encounter (Signed)
Called and left  Message with all appointments

## 2015-02-08 ENCOUNTER — Telehealth: Payer: Self-pay

## 2015-02-08 DIAGNOSIS — C169 Malignant neoplasm of stomach, unspecified: Secondary | ICD-10-CM

## 2015-02-08 MED ORDER — IMATINIB MESYLATE 100 MG PO TABS: 300.0000 mg | ORAL_TABLET | Freq: Every day | ORAL | Status: DC

## 2015-02-08 NOTE — Telephone Encounter (Signed)
Faxed information to Dr. Clovis Riley for appointment 02-16-15 as requested by Dr. Marko Plume. Mr. Boody notified of appointment as well. Faxed request to [facility] Pathology Dept for path slides from 01-21-15 Bx Case # [id number]  to be sent to: [address] [address] [facility] [place], [place]. [address] [employer] # [id number]

## 2015-02-08 NOTE — Telephone Encounter (Signed)
Phone in order to [place] for Novatis on 02-02-15 increasing Gleevec to 300 mg daily as noted below by Dr. Marko Plume.

## 2015-02-08 NOTE — Telephone Encounter (Signed)
Please fax to [facility] [phone]  Report CT CAP 12-30-14  Report US biopsy 01-21-15  Path report 01-21-15  My notes 3-21 and 4-7 (4-7 not done yet today)  Last 2 CBC, CMET   Watch for fax from [facility] to [phone] re what they need with path from biopsy 01-21-15   We have increased Gleevec. Please send new prescription for 100 mg tablets to take total 300 mg daily #90 for 4 weeks   Please let patient know appt with Dr Ronnette Hila will be Wed April 27 @10  AM, 4th floor [place]. They will mail information to him.  I have sent POF to schedulers for B12 + lab ~ 4 weeks and to see me with labs 8 weeks, but can change these depending on plan after he sees Dr Clovis Riley if needed.  thanks

## 2015-02-23 ENCOUNTER — Other Ambulatory Visit: Payer: Self-pay

## 2015-02-23 DIAGNOSIS — C49A0 Gastrointestinal stromal tumor, unspecified site: Secondary | ICD-10-CM

## 2015-02-24 ENCOUNTER — Ambulatory Visit (HOSPITAL_BASED_OUTPATIENT_CLINIC_OR_DEPARTMENT_OTHER): Payer: BLUE CROSS/BLUE SHIELD

## 2015-02-24 ENCOUNTER — Other Ambulatory Visit (HOSPITAL_BASED_OUTPATIENT_CLINIC_OR_DEPARTMENT_OTHER): Payer: BLUE CROSS/BLUE SHIELD

## 2015-02-24 VITALS — BP 120/79 | HR 71 | Temp 99.0°F

## 2015-02-24 DIAGNOSIS — E538 Deficiency of other specified B group vitamins: Secondary | ICD-10-CM

## 2015-02-24 DIAGNOSIS — C49A0 Gastrointestinal stromal tumor, unspecified site: Secondary | ICD-10-CM

## 2015-02-24 DIAGNOSIS — C494 Malignant neoplasm of connective and soft tissue of abdomen: Secondary | ICD-10-CM | POA: Diagnosis not present

## 2015-02-24 DIAGNOSIS — C50422 Malignant neoplasm of upper-outer quadrant of left male breast: Secondary | ICD-10-CM

## 2015-02-24 LAB — CBC WITH DIFFERENTIAL/PLATELET
BASO%: 1 % (ref 0.0–2.0)
Basophils Absolute: 0.1 10*3/uL (ref 0.0–0.1)
EOS%: 2.9 % (ref 0.0–7.0)
Eosinophils Absolute: 0.2 10*3/uL (ref 0.0–0.5)
HEMATOCRIT: 39.4 % (ref 38.4–49.9)
HEMOGLOBIN: 12.9 g/dL — AB (ref 13.0–17.1)
LYMPH%: 35.3 % (ref 14.0–49.0)
MCH: 31.2 pg (ref 27.2–33.4)
MCHC: 32.7 g/dL (ref 32.0–36.0)
MCV: 95.2 fL (ref 79.3–98.0)
MONO#: 0.7 10*3/uL (ref 0.1–0.9)
MONO%: 10.7 % (ref 0.0–14.0)
NEUT#: 3.1 10*3/uL (ref 1.5–6.5)
NEUT%: 50.1 % (ref 39.0–75.0)
PLATELETS: 234 10*3/uL (ref 140–400)
RBC: 4.14 10*6/uL — ABNORMAL LOW (ref 4.20–5.82)
RDW: 15.2 % — AB (ref 11.0–14.6)
WBC: 6.2 10*3/uL (ref 4.0–10.3)
lymph#: 2.2 10*3/uL (ref 0.9–3.3)

## 2015-02-24 LAB — COMPREHENSIVE METABOLIC PANEL (CC13)
ALT: 12 U/L (ref 0–55)
AST: 16 U/L (ref 5–34)
Albumin: 3.5 g/dL (ref 3.5–5.0)
Alkaline Phosphatase: 32 U/L — ABNORMAL LOW (ref 40–150)
Anion Gap: 12 mEq/L — ABNORMAL HIGH (ref 3–11)
BUN: 13.4 mg/dL (ref 7.0–26.0)
CALCIUM: 8.3 mg/dL — AB (ref 8.4–10.4)
CHLORIDE: 111 meq/L — AB (ref 98–109)
CO2: 23 mEq/L (ref 22–29)
Creatinine: 1 mg/dL (ref 0.7–1.3)
EGFR: 81 mL/min/{1.73_m2} — AB (ref >90)
GLUCOSE: 119 mg/dL (ref 70–140)
POTASSIUM: 3.4 meq/L — AB (ref 3.5–5.1)
Sodium: 145 mEq/L (ref 136–145)
Total Bilirubin: 1.62 mg/dL — ABNORMAL HIGH (ref 0.20–1.20)
Total Protein: 6 g/dL — ABNORMAL LOW (ref 6.4–8.3)

## 2015-02-24 MED ORDER — CYANOCOBALAMIN 1000 MCG/ML IJ SOLN
1000.0000 ug | Freq: Once | INTRAMUSCULAR | Status: AC
  Administered 2015-02-24: 1000 ug via INTRAMUSCULAR

## 2015-02-24 NOTE — Patient Instructions (Signed)

## 2015-03-23 ENCOUNTER — Other Ambulatory Visit: Payer: Self-pay

## 2015-03-23 ENCOUNTER — Other Ambulatory Visit: Payer: Self-pay | Admitting: Oncology

## 2015-03-23 DIAGNOSIS — C649 Malignant neoplasm of unspecified kidney, except renal pelvis: Secondary | ICD-10-CM

## 2015-03-23 DIAGNOSIS — C50422 Malignant neoplasm of upper-outer quadrant of left male breast: Secondary | ICD-10-CM

## 2015-03-23 DIAGNOSIS — C49A0 Gastrointestinal stromal tumor, unspecified site: Secondary | ICD-10-CM

## 2015-03-24 ENCOUNTER — Other Ambulatory Visit (HOSPITAL_BASED_OUTPATIENT_CLINIC_OR_DEPARTMENT_OTHER): Payer: BLUE CROSS/BLUE SHIELD

## 2015-03-24 ENCOUNTER — Other Ambulatory Visit: Payer: Self-pay | Admitting: Oncology

## 2015-03-24 ENCOUNTER — Ambulatory Visit (HOSPITAL_BASED_OUTPATIENT_CLINIC_OR_DEPARTMENT_OTHER): Payer: BLUE CROSS/BLUE SHIELD

## 2015-03-24 ENCOUNTER — Ambulatory Visit (HOSPITAL_BASED_OUTPATIENT_CLINIC_OR_DEPARTMENT_OTHER): Payer: BLUE CROSS/BLUE SHIELD | Admitting: Oncology

## 2015-03-24 ENCOUNTER — Encounter: Payer: Self-pay | Admitting: Oncology

## 2015-03-24 VITALS — BP 136/90 | HR 86 | Temp 98.3°F | Resp 18 | Ht 72.0 in | Wt 131.4 lb

## 2015-03-24 DIAGNOSIS — E538 Deficiency of other specified B group vitamins: Secondary | ICD-10-CM

## 2015-03-24 DIAGNOSIS — C49A0 Gastrointestinal stromal tumor, unspecified site: Secondary | ICD-10-CM

## 2015-03-24 DIAGNOSIS — C50422 Malignant neoplasm of upper-outer quadrant of left male breast: Secondary | ICD-10-CM

## 2015-03-24 DIAGNOSIS — C787 Secondary malignant neoplasm of liver and intrahepatic bile duct: Secondary | ICD-10-CM

## 2015-03-24 DIAGNOSIS — Z8553 Personal history of malignant neoplasm of renal pelvis: Secondary | ICD-10-CM

## 2015-03-24 DIAGNOSIS — C649 Malignant neoplasm of unspecified kidney, except renal pelvis: Secondary | ICD-10-CM

## 2015-03-24 DIAGNOSIS — C499 Malignant neoplasm of connective and soft tissue, unspecified: Secondary | ICD-10-CM

## 2015-03-24 LAB — CBC WITH DIFFERENTIAL/PLATELET
BASO%: 0.5 % (ref 0.0–2.0)
BASOS ABS: 0 10*3/uL (ref 0.0–0.1)
EOS ABS: 0.2 10*3/uL (ref 0.0–0.5)
EOS%: 2.3 % (ref 0.0–7.0)
HCT: 37.4 % — ABNORMAL LOW (ref 38.4–49.9)
HEMOGLOBIN: 12.3 g/dL — AB (ref 13.0–17.1)
LYMPH%: 26.1 % (ref 14.0–49.0)
MCH: 31.5 pg (ref 27.2–33.4)
MCHC: 32.9 g/dL (ref 32.0–36.0)
MCV: 95.9 fL (ref 79.3–98.0)
MONO#: 0.8 10*3/uL (ref 0.1–0.9)
MONO%: 12.2 % (ref 0.0–14.0)
NEUT%: 58.9 % (ref 39.0–75.0)
NEUTROS ABS: 3.9 10*3/uL (ref 1.5–6.5)
Platelets: 220 10*3/uL (ref 140–400)
RBC: 3.9 10*6/uL — ABNORMAL LOW (ref 4.20–5.82)
RDW: 15.4 % — ABNORMAL HIGH (ref 11.0–14.6)
WBC: 6.5 10*3/uL (ref 4.0–10.3)
lymph#: 1.7 10*3/uL (ref 0.9–3.3)

## 2015-03-24 LAB — COMPREHENSIVE METABOLIC PANEL (CC13)
ALT: 9 U/L (ref 0–55)
AST: 19 U/L (ref 5–34)
Albumin: 3.3 g/dL — ABNORMAL LOW (ref 3.5–5.0)
Alkaline Phosphatase: 25 U/L — ABNORMAL LOW (ref 40–150)
Anion Gap: 6 mEq/L (ref 3–11)
BILIRUBIN TOTAL: 1.31 mg/dL — AB (ref 0.20–1.20)
BUN: 10.9 mg/dL (ref 7.0–26.0)
CALCIUM: 8.4 mg/dL (ref 8.4–10.4)
CO2: 29 mEq/L (ref 22–29)
Chloride: 110 mEq/L — ABNORMAL HIGH (ref 98–109)
Creatinine: 0.8 mg/dL (ref 0.7–1.3)
Glucose: 77 mg/dl (ref 70–140)
POTASSIUM: 3.7 meq/L (ref 3.5–5.1)
Sodium: 144 mEq/L (ref 136–145)
TOTAL PROTEIN: 5.8 g/dL — AB (ref 6.4–8.3)

## 2015-03-24 MED ORDER — CYANOCOBALAMIN 1000 MCG/ML IJ SOLN
1000.0000 ug | Freq: Once | INTRAMUSCULAR | Status: AC
  Administered 2015-03-24: 1000 ug via INTRAMUSCULAR

## 2015-03-24 NOTE — Progress Notes (Signed)
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 Since last visit here, he has been seen and treated for the GIST at [facility]. I have reviewed clinic note from Dr Kendall Flack, which will be scanned into this EMR, and I have spoken with radiation oncology after visit. Other information will be requested, as this is not available in [place].  He also is on tamoxifen adjuvantly for left breast cancer, has sequelae of gastric and small bowel resections for the GIST for which he receives monthly B12 and has had IV iron,  and remote history of renal carcinoma.  He is BRCA negative.  He had CT CAP 08-28-13, unremarkable, then next scan (due to insurance problems in interim) was CT CAP 12-30-14, which showed solitary liver lesion, IR biopsy confirming GIST. Patient had been on gleevec at 100 mg daily until findings on march 2016 Ct. He increased gleevec dose to 200 mg/24 hrs shortly prior to my visit early April, then to 300 mg / 24 hrs at time of my last visit on 01-27-15. CMET 03-25-15 had T bili 1.62, AP low at 32 and other LFTs WN  History other than information in Dr Alesia Morin note from patient now:  Initial consultation with Dr Clovis Riley recommended nonsurgical intervention. Repeat MRI at [facility] showed lesion 1.8 cm as opposed to 2 cm by CT. He saw Dr Kendall Flack on 03-01-15, with history given then that patient was off gleevec entirely for ~ 5 months due to insurance issues (that time frame is new information to me despite our contacts during the period of insurance concerns).As not clear if GIST progressed on gleevec or during the time that he was off drug, Dr Kendall Flack suggests follow up scan on Gleevec in ~ 3 months.  Patient was seen at same time by Dr Morene Rankins of radiation  oncology and treated with stereotactic body radiation to the liver lesion from 5-23 thru 03-18-15, tolerated well. Patient recalls that radaition oncology recommended MRI 4-6 weeks after procedure.   Mr Strothman has felt well since he was here last, with excellent energy, no pain, good appetite, no noted changes in breasts bilaterally or axillae.  No central catheter  ONCOLOGIC HISTORY LEFT BREAST CANCER Patient had been aware of palpable mass adjacent to left areola for possibly 2-3 months, which had not changed that he could tell during that time. He had bilateral mammograms and left US done at [facility] on 08-28-13, then excisional biopsy by Dr Brantley Stage on 09-23-13. 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 He had re-excision with evaluation of sentinel nodes on 10-14-13 ([id number]), with no residual malignancy in the specimen and 6 sentinel nodes negative. MDBC consensus was that extent of surgery was felt to be adequate and radiation oncology did not recommend additional radiation therapy; genetics testing was negative 01-2014 and bilateral mastectomies not recommended. He began tamoxifen 10-20-14, taking doses away from gleevec in attempt to minimize possible decrease in active endoxifen metabolite of tamoxifen.  GIST was initially disgnosed 2007, with subtotal gastrectomy with Roux-en-Y, then briefly on study with [place] which he did not tolerate due to marked elevations in LFTs. He had recurrent GIST in late 2012  with further surgery by Dr.Levine March 2011, then rapid elevation of LFTs with 50% dosing (200 mg/d) gleevec, with gleevec again discontinued after short period in 2011. He had further progression in June 2012, did tolerate gleevec at 152m/d with initial prednisone, with adequate shrinkage of tumor to allow surgery by Dr.  LClovis RileyDec 6, 2012. At laparotomy he had extensive adhesions but no gross peritoneal, hepatic or bowel involvement. The mass itself was cystic and soft and not densely adherent to any visceral structures. Pathology([facility] [id number] recurrent GIST 8.9 cm with resection margins not involved and comment that microscopically this exhibits an epithelial and glandular growth pattern which differs from previous specimens; nuclei medium-sized and bland; immunohistochemical panel postive for c-kit. No mitotic index is reported. He saw Dr.Levine last in Dec 2012. [facility], and PET Jan 2011. He subsequently did not have scans until 12-2014 due to insurance constraints. CT CAP 12-30-14 had new 2.4 x 2 cm lesion in right hepatic lobe, stable 3 mm and 2 mm right middle lobe lung nodules and no axillary or mediastinal adenopathy. The liver lesion was confirmed GIST [facility]. Gleevec was increased to 100 mg tic on ! 01-27-15. [facility].  [facility]   Mr.Neal has needed B12 injections due to gastric/ bowel resection. He was iron deficient related to surgeries and blood draws, previously had marked skin reaction to feraheme, but did tolerate inferon on 12-10-2011 and 07-26-2014 with no difficulty.   RENAL CANCER history of T1a renal cell carcinoma, treated with partial right nephrectomy in 2008 and not active since then.    Review of systems as above, also: No fever or symptoms of infection. No SOB. No abdominal pain. No bleeding.  Remainder of 10 point Review of Systems negative.  Objective:  Vital signs in last 24 hours:  BP 136/90 mmHg  Pulse 86  Temp(Src) 98.3 F (36.8 C) (Oral)  Resp 18  Ht 6' (1.829 m)  Wt 131 lb 6.4 oz (59.603 kg)  BMI 17.82 kg/m2  SpO2 100% Weight up 1 lb Alert, oriented and appropriate. Ambulatory without  difficulty.    HEENT:PERRL, sclerae not icteric. Oral mucosa moist without lesions, posterior pharynx clear.  Neck supple. No JVD.  Lymphatics:no cervical,supraclavicular, right axillary adenopathy. Stable tiny left axillary nodes grouped in medial axilla, previous imaging as noted. No other adenopathy Resp: clear to auscultation bilaterally and normal percussion bilaterally Cardio: regular rate and rhythm. No gallop. GI: soft, nontender, not distended, no mass or organomegaly. Normally active bowel sounds. Surgical incisions not remarkable. Markings for SBRS on right flank. Musculoskeletal/ Extremities: without pitting edema, cords, tenderness Neuro: no peripheral neuropathy. Otherwise nonfocal. Psych appropriate mood and affect Skin without rash, ecchymosis, petechiae Breasts: Right without dominant mass, skin or nipple findings. Left with well healed excisional biopsy scar, no mass, skin change or nipple findings. Axillae benign.   Lab Results:  Results for orders placed or performed in visit on 03/24/15  CBC with Differential  Result Value Ref Range   WBC 6.5 4.0 - 10.3 10e3/uL   NEUT# 3.9 1.5 - 6.5 10e3/uL   HGB 12.3 (L) 13.0 - 17.1 g/dL   HCT 37.4 (L) 38.4 - 49.9 %   Platelets 220 140 - 400 10e3/uL   MCV 95.9 79.3 - 98.0 fL   MCH 31.5 27.2 - 33.4 pg  MCHC 32.9 32.0 - 36.0 g/dL   RBC 3.90 (L) 4.20 - 5.82 10e6/uL   RDW 15.4 (H) 11.0 - 14.6 %   lymph# 1.7 0.9 - 3.3 10e3/uL   MONO# 0.8 0.1 - 0.9 10e3/uL   Eosinophils Absolute 0.2 0.0 - 0.5 10e3/uL   Basophils Absolute 0.0 0.0 - 0.1 10e3/uL   NEUT% 58.9 39.0 - 75.0 %   LYMPH% 26.1 14.0 - 49.0 %   MONO% 12.2 0.0 - 14.0 %   EOS% 2.3 0.0 - 7.0 %   BASO% 0.5 0.0 - 2.0 %  Comprehensive metabolic panel (Cmet) - [facility]  Result Value Ref Range   Sodium 144 136 - 145 mEq/L   Potassium 3.7 3.5 - 5.1 mEq/L   Chloride 110 (H) 98 - 109 mEq/L   CO2 29 22 - 29 mEq/L   Glucose 77 70 - 140 mg/dl   BUN 10.9 7.0 - 26.0 mg/dL   Creatinine  0.8 0.7 - 1.3 mg/dL   Total Bilirubin 1.31 (H) 0.20 - 1.20 mg/dL   Alkaline Phosphatase 25 (L) 40 - 150 U/L   AST 19 5 - 34 U/L   ALT 9 0 - 55 U/L   Total Protein 5.8 (L) 6.4 - 8.3 g/dL   Albumin 3.3 (L) 3.5 - 5.0 g/dL   Calcium 8.4 8.4 - 10.4 mg/dL   Anion Gap 6 3 - 11 mEq/L   EGFR >90 >90 ml/min/1.73 m2    Labs reviewed with patient at time of visit.  Studies/Results:  No results found.  Will request MRI report from [facility]   Medications: I have reviewed the patient's current medications. He is taking Gleevec 100 mg every 8 hours, with tamoxifen spaced between doses.   DISCUSSION Interval history reviewed with Mr Malphrus. He understands rationale for continuing gleevec as long as it controls GIST, is pleased with how well he tolerated the stereotactic radiation and feels that he is tolerating present gleevec dose well. He has met deductible for insurance.  Following visit, I spoke with resident MD who treated Mr Juhasz with Dr Morene Rankins (Radiation Oncology [facility] [phone])  Plan is for repeat MRI liver at [facility] in 4-6 weeks from the procedure, with follow up visit there. That office will schedule.  I have spoken with EPIC support re CareEverywhere issue, no resolution yet.  Assessment/Plan:  1.Progression of GIST: single 2.4 x 2 cm right hepatic lesion found on CT 12-2013 and confirmed by biopsy, not clear if progressed when off gleevec for several months in last year or progressed on gleevec. Post stereotactic radiation and on increased gleevec. Next imaging should be repeat MRI [facility] in follow up of the radiation. I will see him back coordinating with B12 injection in August. 2.GIST: initially diagnosed 2007 with subtotal gastrectomy with Roux-en-Y, then briefly on study with [place] which he did not tolerate due to marked elevations in LFTs. Recurrent GIST with surgery by Dr Clovis Riley March 2011, then rapid elevation of LFTs with 50% dose reduction of Gleevec. Further  progression June 2012, tolerated gleevec at 100 mg daily with initial steroids, further surgery Dec 2012. 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 Nodularity in left anterior axillaappeared scarring + non-enlarged lymph nodes. Continue tamoxifen, spacing doses out from [place] due to potential decrease in active tamoxifen metabolite. Due mammograms 08-2014. 4.T1a renal cell carcinoma treated with right  partial nephrectomy 2008, not recurrent. Last UA in this system 07-2014. 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 Given today. 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 Hemoglobin in good range today 7.long past tobacco. Stable tiny lung nodules, no other pulmonary findings of concern on CT  8.cataracts: recently diagnosis, may be increasing more quickly on tamoxifen.  9.atherosclerotic changes in aorta  10.difficult social situation: now lives ~ [place] from [place]. Insurance concerns. 11.reported hx manic depressive illness, tho I have never been clear about that diagnosis 12.increased voiding late in day:PSA 10-2013 was 0.6, prostate ok on CT 13.low weight: limited po intake since nearly total gastrectomy   All questions answered to best of my ability. Time spent 30 min including >50% counseling and coordination of care.    Gordy Levan, MD   03/24/2015, 8:27 PM

## 2015-03-25 ENCOUNTER — Telehealth: Payer: Self-pay | Admitting: Oncology

## 2015-03-25 ENCOUNTER — Telehealth: Payer: Self-pay

## 2015-03-25 NOTE — Telephone Encounter (Signed)
LM in Henry [name] VM regarding the information on MRI and Follow up appointments as noted below by Dr. Marko Plume. He is welcome to call Dr. Mariana Kaufman office if he has any questions about appointments.

## 2015-03-25 NOTE — Telephone Encounter (Signed)
-----   Message from Gordy Levan, MD sent at 03/25/2015  3:23 PM EDT ----- Please let Henry [name] know that I spoke with Dr Cleon Dew office (dept Radiation Oncology at [facility], [phone]). They do prefer the upcoming MRI be done at their facility, expected to be 4-6 weeks from the 5 doses of stereotactic body radiosurgery that he received 5-23 thru 03-18-15.  That office is to set up the MRI and let Henry [name] know that appointment and his follow up appointment with the radiation oncology physicians.  (Please let Henry [name] know that Dr Morene Rankins is Magazine features editor of dept of radiation oncology at [facility])  Henry [name] will also let him know next apts in [employer].  thanks

## 2015-03-25 NOTE — Telephone Encounter (Signed)
Called and left a message with appointments °

## 2015-03-27 DIAGNOSIS — C787 Secondary malignant neoplasm of liver and intrahepatic bile duct: Secondary | ICD-10-CM | POA: Insufficient documentation

## 2015-04-06 ENCOUNTER — Telehealth: Payer: Self-pay

## 2015-04-06 NOTE — Telephone Encounter (Signed)
Received END of Treatment Summary for Radiation dated 04-05-15.  There are  no notes from the five days of treatment. Received report of  MRI of the Liver from 03-01-15. Received consult note while pt in hospital dated 02-21-15; 02-27-15 addendum note from Dr. Morene Rankins. Received treatment planning note dated 03-06-15. Sent a copy of these records  To be scanned into patient's EMR.   Placed a copy of theses records on Dr. Mariana Kaufman Desk for review.

## 2015-04-06 NOTE — Telephone Encounter (Signed)
AS of 04-06-15 Henry [name] has a post op visit with Dr. Morene Rankins on 04-22-15 MRI of Liver on 05-31-15. Appointment on 05-31-15 for lab and visit with Dr. Turner Daniels.

## 2015-04-06 NOTE — Telephone Encounter (Signed)
-----   Message from Gordy Levan, MD sent at 03/25/2015  3:38 PM EDT ----- Radiation oncology treatment notes from 5-23 thru 03-18-15 not available yet (Dr Iona Beard   [phone])  I would like these + report of MRI done at [facility] when available. WIll also need clinic note + the follow up MRI report due in 4-6 weeks.  Maybe it will all eventually be available in [place], but may need just to request.  Fine to wait another 1-2 weeks before trying to get this.  All of it will need to be scanned into EMR as well as copies for me  thanks

## 2015-04-20 ENCOUNTER — Other Ambulatory Visit: Payer: Self-pay

## 2015-04-20 DIAGNOSIS — C50422 Malignant neoplasm of upper-outer quadrant of left male breast: Secondary | ICD-10-CM

## 2015-04-20 MED ORDER — TAMOXIFEN CITRATE 10 MG PO TABS: 10.0000 mg | ORAL_TABLET | Freq: Two times a day (BID) | ORAL | Status: DC

## 2015-04-26 ENCOUNTER — Encounter: Payer: Self-pay | Admitting: Oncology

## 2015-04-26 NOTE — Progress Notes (Signed)
I placed form for asst for gleevec on desk of nurse for dr. Marko Plume

## 2015-04-28 ENCOUNTER — Other Ambulatory Visit: Payer: Self-pay

## 2015-04-28 DIAGNOSIS — C169 Malignant neoplasm of stomach, unspecified: Secondary | ICD-10-CM

## 2015-04-28 MED ORDER — IMATINIB MESYLATE 100 MG PO TABS: 300.0000 mg | ORAL_TABLET | Freq: Every day | ORAL | Status: DC

## 2015-05-05 ENCOUNTER — Other Ambulatory Visit: Payer: BLUE CROSS/BLUE SHIELD

## 2015-05-05 ENCOUNTER — Ambulatory Visit: Payer: BLUE CROSS/BLUE SHIELD

## 2015-05-05 ENCOUNTER — Telehealth: Payer: Self-pay | Admitting: *Deleted

## 2015-05-05 NOTE — Telephone Encounter (Signed)
Called patient about missed lab and injection appointments.  Left message for him to call back and reschedule.

## 2015-05-21 ENCOUNTER — Other Ambulatory Visit: Payer: Self-pay | Admitting: Oncology

## 2015-05-24 ENCOUNTER — Telehealth: Payer: Self-pay

## 2015-05-24 NOTE — Telephone Encounter (Addendum)
Spoke with Janett Billow in radiation Oncology at [facility]. Dr. Canary Brim office and Mr. Turnley did not show for follow up appointment on 04-22-15.  This appointment was rescheduled to 06-06-15 at 3 pm arive at 2:45 pm. The MRI of liver remains 05-31-15 at 1 pm arrive at 12:45 pm as well as appointment with Dr. Turner Daniels. Register 05-31-15 at 2:15 for lab.  Visit is at 3 pm.

## 2015-05-24 NOTE — Telephone Encounter (Signed)
-----   Message from Gordy Levan, MD sent at 05/21/2015  2:46 PM EDT ----- Please call [facility] radiation oncology to find out what appointments he has scheduled there Maybe MRI liver + MD  So I can be sure he knows when I see him on 8-8  thanks

## 2015-05-24 NOTE — Telephone Encounter (Signed)
Spoke with Janett Billow in radiation Oncology at [facility]. Dr. Canary Brim office and Mr. Kullman did not show for follow up appointment on 04-22-15.  This appointment was rescheduled to 06-06-15 at 3 pm arive at 2:45 pm. The MRI of liver remains 05-31-15 at 1 pm arrive at 12:45 pm as well as appointment with Dr. Turner Daniels. Register 05-31-15 at 2:15 for lab.  Visit is at 3 pm.

## 2015-05-29 ENCOUNTER — Other Ambulatory Visit: Payer: Self-pay | Admitting: Oncology

## 2015-05-30 ENCOUNTER — Encounter: Payer: Self-pay | Admitting: Oncology

## 2015-05-30 ENCOUNTER — Telehealth: Payer: Self-pay | Admitting: Oncology

## 2015-05-30 ENCOUNTER — Other Ambulatory Visit (HOSPITAL_BASED_OUTPATIENT_CLINIC_OR_DEPARTMENT_OTHER): Payer: BLUE CROSS/BLUE SHIELD

## 2015-05-30 ENCOUNTER — Ambulatory Visit (HOSPITAL_BASED_OUTPATIENT_CLINIC_OR_DEPARTMENT_OTHER): Payer: BLUE CROSS/BLUE SHIELD | Admitting: Oncology

## 2015-05-30 ENCOUNTER — Ambulatory Visit (HOSPITAL_BASED_OUTPATIENT_CLINIC_OR_DEPARTMENT_OTHER): Payer: BLUE CROSS/BLUE SHIELD

## 2015-05-30 VITALS — BP 124/73 | HR 70 | Temp 98.5°F | Resp 18 | Ht 72.0 in | Wt 128.9 lb

## 2015-05-30 DIAGNOSIS — E538 Deficiency of other specified B group vitamins: Secondary | ICD-10-CM | POA: Diagnosis not present

## 2015-05-30 DIAGNOSIS — C787 Secondary malignant neoplasm of liver and intrahepatic bile duct: Secondary | ICD-10-CM | POA: Diagnosis not present

## 2015-05-30 DIAGNOSIS — C49A0 Gastrointestinal stromal tumor, unspecified site: Secondary | ICD-10-CM

## 2015-05-30 DIAGNOSIS — C50422 Malignant neoplasm of upper-outer quadrant of left male breast: Secondary | ICD-10-CM

## 2015-05-30 DIAGNOSIS — C499 Malignant neoplasm of connective and soft tissue, unspecified: Secondary | ICD-10-CM

## 2015-05-30 DIAGNOSIS — R634 Abnormal weight loss: Secondary | ICD-10-CM

## 2015-05-30 LAB — CBC WITH DIFFERENTIAL/PLATELET
BASO%: 1.3 % (ref 0.0–2.0)
Basophils Absolute: 0.1 10*3/uL (ref 0.0–0.1)
EOS%: 2.4 % (ref 0.0–7.0)
Eosinophils Absolute: 0.1 10*3/uL (ref 0.0–0.5)
HEMATOCRIT: 38.3 % — AB (ref 38.4–49.9)
HGB: 12.7 g/dL — ABNORMAL LOW (ref 13.0–17.1)
LYMPH%: 25.7 % (ref 14.0–49.0)
MCH: 31.9 pg (ref 27.2–33.4)
MCHC: 33.1 g/dL (ref 32.0–36.0)
MCV: 96.5 fL (ref 79.3–98.0)
MONO#: 0.9 10*3/uL (ref 0.1–0.9)
MONO%: 16 % — AB (ref 0.0–14.0)
NEUT#: 3 10*3/uL (ref 1.5–6.5)
NEUT%: 54.6 % (ref 39.0–75.0)
Platelets: 218 10*3/uL (ref 140–400)
RBC: 3.97 10*6/uL — AB (ref 4.20–5.82)
RDW: 14.2 % (ref 11.0–14.6)
WBC: 5.5 10*3/uL (ref 4.0–10.3)
lymph#: 1.4 10*3/uL (ref 0.9–3.3)

## 2015-05-30 LAB — COMPREHENSIVE METABOLIC PANEL (CC13)
ALT: 17 U/L (ref 0–55)
AST: 28 U/L (ref 5–34)
Albumin: 3.5 g/dL (ref 3.5–5.0)
Alkaline Phosphatase: 34 U/L — ABNORMAL LOW (ref 40–150)
Anion Gap: 5 mEq/L (ref 3–11)
BUN: 10.4 mg/dL (ref 7.0–26.0)
CO2: 29 mEq/L (ref 22–29)
Calcium: 8.4 mg/dL (ref 8.4–10.4)
Chloride: 113 mEq/L — ABNORMAL HIGH (ref 98–109)
Creatinine: 0.9 mg/dL (ref 0.7–1.3)
EGFR: 89 mL/min/{1.73_m2} — AB (ref >90)
Glucose: 49 mg/dl — ABNORMAL LOW (ref 70–140)
POTASSIUM: 3.6 meq/L (ref 3.5–5.1)
Sodium: 147 mEq/L — ABNORMAL HIGH (ref 136–145)
TOTAL PROTEIN: 6.2 g/dL — AB (ref 6.4–8.3)
Total Bilirubin: 1.47 mg/dL — ABNORMAL HIGH (ref 0.20–1.20)

## 2015-05-30 MED ORDER — CYANOCOBALAMIN 1000 MCG/ML IJ SOLN
1000.0000 ug | Freq: Once | INTRAMUSCULAR | Status: AC
  Administered 2015-05-30: 1000 ug via INTRAMUSCULAR

## 2015-05-30 NOTE — Progress Notes (Signed)
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 Per phone conversation with [facility], patient missed follow up MRI liver and appointment with radiation oncology there in July, rescheduled for MRI on 8-9 and Dr Morene Rankins on 06-06-15; he also has a follow up appointment with Dr Turner Daniels.  Patient has been given all appointment information today. Mr Kaley continues monthly B12 injections, necessary due to extent of GI resection for the GIST. Last bilateral mammograms at [facility] 08-31-14.  He denies any new or different problems today, with good energy and appetite, chronic problems with low weight due to GI resections, no nausea or vomiting, no pain, no SOB, no bleeding, no changes in breasts. He spaces tamoxifen doses away from [facility] as best possible.    No central catheter BRCA negative  Difficult financial situation ongoing. He declines offer to discuss with [facility] financial staff today and prefers to have the MRI done at [facility] as planned.  ONCOLOGIC HISTORY LEFT BREAST CANCER Patient had been aware of palpable mass adjacent to left areola for possibly 2-3 months, which had not changed that he could tell during that time. He had bilateral mammograms and left US done at [facility] on 08-28-13, then excisional biopsy by Dr Brantley Stage on 09-23-13. 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 He had re-excision with evaluation of sentinel nodes on 10-14-13 ([id number]), with no residual malignancy in  the specimen and 6 sentinel nodes negative. MDBC consensus was that extent of surgery was felt to be adequate and radiation oncology did not recommend additional radiation therapy; genetics testing was negative 01-2014 and bilateral mastectomies not recommended. He began tamoxifen 10-20-14, taking doses away from gleevec in attempt to minimize possible decrease in active endoxifen metabolite of tamoxifen.  GIST was initially disgnosed 2007, with subtotal gastrectomy with Roux-en-Y, then briefly on study with [place] which he did not tolerate due to marked elevations in LFTs. He had recurrent GIST in late 2012 with further surgery by Dr.Levine March 2011, then rapid elevation of LFTs with 50% dosing (200 mg/d) gleevec, with gleevec again discontinued after short period in 2011. He had further progression in June 2012, did tolerate gleevec at 137m/d with initial prednisone, with adequate shrinkage of tumor to allow surgery by Dr. LClovis RileyDec 6, 2012. At laparotomy he had extensive adhesions but no gross peritoneal, hepatic or bowel involvement. The mass itself was cystic and soft and not densely adherent to any visceral structures. Pathology([facility] [id number] recurrent GIST 8.9 cm with resection margins not involved and comment that microscopically this exhibits an epithelial and glandular growth pattern which differs from previous specimens; nuclei medium-sized and bland; immunohistochemical panel postive for c-kit. No mitotic index is reported. He saw Dr.Levine last in Dec 2012. [facility], and PET Jan 2011. He subsequently did not have scans until 12-2014 due to insurance constraints. CT CAP 12-30-14 had new 2.4 x 2 cm lesion in right hepatic lobe, stable 3 mm and 2 mm right middle lobe lung nodules and no axillary or mediastinal adenopathy. The  liver lesion was confirmed GIST by US biopsy 01-21-15. Gleevec was increased to 100 mg tic on ! 01-27-15. MRI at [facility] prior to  SBRT reportedly showed 1.8 cm lesion. He had stereotactic radiosurgery to the liver lesion at [facility] 5-23 thru 03-18-15.   Mr.Scafidi has needed B12 injections due to gastric/ bowel resection. He was iron deficient related to surgeries and blood draws, previously had marked skin reaction to feraheme, but did tolerate inferon on 12-10-2011 and 07-26-2014 with no difficulty.   RENAL CANCER history of T1a renal cell carcinoma, treated with partial right nephrectomy in 2008 and not active since then.    Review of systems as above, also: No fever or symptoms of infection Remainder of 10 point Review of Systems negative.  Objective:  Vital signs in last 24 hours:  BP 124/73 mmHg  Pulse 70  Temp(Src) 98.5 F (36.9 C) (Oral)  Resp 18  Ht 6' (1.829 m)  Wt 128 lb 14.4 oz (58.469 kg)  BMI 17.48 kg/m2 Weight down 3 lbs.Alert, oriented and appropriate. Ambulatory without difficulty.  Alopecia  HEENT:PERRL, sclerae not icteric. Oral mucosa moist without lesions, posterior pharynx clear.  Neck supple. No JVD.  Lymphatics:no cervical,supraclavicular adenopathy Resp: clear to auscultation bilaterally and normal percussion bilaterally Cardio: regular rate and rhythm. No gallop. GI: soft, nontender, not distended, no palpable mass.. Normally active bowel sounds. Surgical incisions not remarkable. Musculoskeletal/ Extremities: without pitting edema, cords, tenderness. No swelling LUE or LE Neuro: no peripheral neuropathy. Otherwise nonfocal Skin without rash, ecchymosis, petechiae Breasts: right without dominant mass, skin or nipple findings. Left lumpectomy scar not remarkable, no mass otherwise. Nothing right axilla. Left axilla with linear cluster of small mobile nodes medially and another 0.5 cm mobile soft node in high axilla. Note very little SQ fat.   Lab Results:  Results for orders placed or performed in visit on 05/30/15  CBC with Differential/Platelet  Result Value Ref Range   WBC  5.5 4.0 - 10.3 10e3/uL   NEUT# 3.0 1.5 - 6.5 10e3/uL   HGB 12.7 (L) 13.0 - 17.1 g/dL   HCT 38.3 (L) 38.4 - 49.9 %   Platelets 218 140 - 400 10e3/uL   MCV 96.5 79.3 - 98.0 fL   MCH 31.9 27.2 - 33.4 pg   MCHC 33.1 32.0 - 36.0 g/dL   RBC 3.97 (L) 4.20 - 5.82 10e6/uL   RDW 14.2 11.0 - 14.6 %   lymph# 1.4 0.9 - 3.3 10e3/uL   MONO# 0.9 0.1 - 0.9 10e3/uL   Eosinophils Absolute 0.1 0.0 - 0.5 10e3/uL   Basophils Absolute 0.1 0.0 - 0.1 10e3/uL   NEUT% 54.6 39.0 - 75.0 %   LYMPH% 25.7 14.0 - 49.0 %   MONO% 16.0 (H) 0.0 - 14.0 %   EOS% 2.4 0.0 - 7.0 %   BASO% 1.3 0.0 - 2.0 %  Comprehensive metabolic panel  Result Value Ref Range   Sodium 147 (H) 136 - 145 mEq/L   Potassium 3.6 3.5 - 5.1 mEq/L   Chloride 113 (H) 98 - 109 mEq/L   CO2 29 22 - 29 mEq/L   Glucose 49 (L) 70 - 140 mg/dl   BUN 10.4 7.0 - 26.0 mg/dL   Creatinine 0.9 0.7 - 1.3 mg/dL   Total Bilirubin 1.47 (H) 0.20 - 1.20 mg/dL   Alkaline Phosphatase 34 (L) 40 - 150 U/L   AST 28 5 - 34 U/L   ALT 17 0 - 55 U/L   Total Protein 6.2 (L) 6.4 -  8.3 g/dL   Albumin 3.5 3.5 - 5.0 g/dL   Calcium 8.4 8.4 - 10.4 mg/dL   Anion Gap 5 3 - 11 mEq/L   EGFR 89 (L) >90 ml/min/1.73 m2   Bili noted, minimally higher  Studies/Results:  No results found.  Medications: I have reviewed the patient's current medications. He asks if he can resume Prozac, which he used several years ago, however that should not be used with tamoxifen.  DISCUSSION: Patient plans to keep appointments for MRI and follow up at [facility]. He seems to be tolerating the gleeved at total 300 mg daily without problems, so potentially could increase further if needed. I will see him back coordinating with the B12 injections, and will be interested in the information from [facility].   Assessment/Plan:  1.Progression of GIST: single 2.4 x 2 cm right hepatic lesion found on CT 12-2013 and confirmed by biopsy, not clear if progressed when off gleevec for several months in last year or  progressed on gleevec. Post stereotactic radiation and on increased gleevec. For repeat MRI at [facility] in follow up of the radiation treatment.  2.GIST: initially diagnosed 2007 with subtotal gastrectomy with Roux-en-Y, then briefly on study with [place] which he did not tolerate due to marked elevations in LFTs. Recurrent GIST with surgery by Dr Clovis Riley March 2011, then rapid elevation of LFTs with 50% dose reduction of Gleevec. Further progression June 2012, tolerated gleevec at 100 mg daily with initial steroids, further surgery Dec 2012. 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 Nodularity in left anterior axillaappeared scarring + non-enlarged lymph nodes. Continue tamoxifen, spacing doses out from [place] due to potential decrease in active tamoxifen metabolite. Due mammograms 08-2015. 4.T1a renal cell carcinoma treated with right partial nephrectomy 2008, not recurrent. Last UA in this system 07-2014. 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 Given today. 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 Hemoglobin in good range today 7.long past tobacco. Stable tiny lung nodules, no other pulmonary findings of concern on CT  8.cataracts: recently diagnosis, may be increasing more quickly on tamoxifen.  9.atherosclerotic changes in aorta  10.difficult social situation: now lives ~ an hour from [place]. Insurance concerns. 11.reported hx manic depressive illness, tho I have never been clear about that diagnosis. Prozac contraindicated with tamoxifen 12.low weight: limited po intake since nearly total gastrectomy   Time spent 20 min including >50% counseling and coordination of care.  Byrd Terrero P, MD   05/30/2015, 5:45 PM

## 2015-05-30 NOTE — Patient Instructions (Signed)
  Appointment schedule at [facility]  MRI of liver 05-31-15 at 1 pm arrive at 12:45 pm   Dr. Turner Daniels. Register 05-31-15 at 2:15 for lab. Visit is at 3 pm.  Dr Morene Rankins radiation oncology  06-06-15 at 3 pm arive at 2:45 pm.

## 2015-05-30 NOTE — Progress Notes (Signed)
I have not recd application for asst with Gleevec back form the patient. It was mailed on 05/06/15

## 2015-05-30 NOTE — Telephone Encounter (Signed)
Gave avs & calendar for September/October. °

## 2015-06-02 ENCOUNTER — Telehealth: Payer: Self-pay | Admitting: Oncology

## 2015-06-02 NOTE — Telephone Encounter (Signed)
Called patient and he is aware of his NUT appointment per staff message from barb neff   anne

## 2015-06-05 DIAGNOSIS — R634 Abnormal weight loss: Secondary | ICD-10-CM | POA: Insufficient documentation

## 2015-06-28 ENCOUNTER — Encounter: Payer: Self-pay | Admitting: Oncology

## 2015-06-28 ENCOUNTER — Other Ambulatory Visit: Payer: Self-pay | Admitting: *Deleted

## 2015-06-28 ENCOUNTER — Ambulatory Visit: Payer: BLUE CROSS/BLUE SHIELD | Admitting: Nutrition

## 2015-06-28 ENCOUNTER — Ambulatory Visit (HOSPITAL_BASED_OUTPATIENT_CLINIC_OR_DEPARTMENT_OTHER): Payer: BLUE CROSS/BLUE SHIELD

## 2015-06-28 VITALS — BP 123/56 | HR 56 | Temp 99.4°F

## 2015-06-28 DIAGNOSIS — C169 Malignant neoplasm of stomach, unspecified: Secondary | ICD-10-CM

## 2015-06-28 DIAGNOSIS — E538 Deficiency of other specified B group vitamins: Secondary | ICD-10-CM

## 2015-06-28 DIAGNOSIS — C49A0 Gastrointestinal stromal tumor, unspecified site: Secondary | ICD-10-CM

## 2015-06-28 MED ORDER — CYANOCOBALAMIN 1000 MCG/ML IJ SOLN
1000.0000 ug | Freq: Once | INTRAMUSCULAR | Status: AC
  Administered 2015-06-28: 1000 ug via INTRAMUSCULAR

## 2015-06-28 MED ORDER — IMATINIB MESYLATE 100 MG PO TABS: 300.0000 mg | ORAL_TABLET | Freq: Every day | ORAL | Status: DC

## 2015-06-28 NOTE — Telephone Encounter (Signed)
PT. STATES HE HAS HAD INSURANCE WITH BLUE CROSS AND BLUE SHIELD AND NEEDS A PRESCRIPTION FOR HIS GLEEVEC. ON 05/30/15 THERE WAS DOCUMENTATION CONCERNING AN APPLICATION FOR ASSISTANCE WITH GLEEVEC. WHEN IT WAS MENTIONED ABOUT THIS APPLICATION THE PT. BECAME VERY IRATE. HE PROCEEDED TO GIVE A STEP BY STEP ACCOUNT OF HOW THE ENTIRE HEALTH CARE SYSTEM INCLUDING [employer] [facility] AND INSURANCE COMPANIES HAVE MADE IT DIFFICULT TO GET HIS GLEEVEC. PT. WENT AS FAR BACK AS 2012 WITH HIS EXPLANATION. TRIED TO HELP PT. UNDERSTANDING THAT EACH INSURANCE COMPANY USES A PARTICULAR SPECIALTY PHARMACY FOR ORAL CHEMOTHERAPY MEDICATIONS LIKE GLEEVEC. IF HE COULD TELL WHICH SPECIALTY PHARMACY BLUE CROSS AND BLUE SHIELD USED THE GLEEVEC PRESCRIPTION COULD BE SENT ELECTRONICALLY AND THE GLEEVEC WOULD BE DELIVERED TO HIS HOUSE. HE HUNG UP THE PHONE. NOTIFIED [place], [place]. SHE INSTRUCTED TO CONTACT MANAGED CARE. NOTIFIED MANAGED CARE, EBONY SMITH OF THE ABOVE SITUATION. SHE WILL CHECK INTO THIS PROBLEM.

## 2015-06-28 NOTE — Telephone Encounter (Signed)
Spoke with [name] while at [facility] for injection.  He has [employer] and needs the prescription sent for [employer] sent to [employer]. Called prescription in to [phone].  Fax: [phone]. Received a fax confirmation of receipt of prescription referral from pharmacy. Gave [name] the [place] number for pharmacy as well. 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 Pharmacy to contact [name] with co-pay information and ship to his home.

## 2015-06-28 NOTE — Progress Notes (Signed)
64 year old man with progression of GIST on Gleevec, status post subtotal gastrectomy, Roux-en-Y in 2007.  Past medical history includes left breast cancer, renal cell cancer status post left partial nephrectomy in 2008, B12 deficiency, iron deficiency, tobacco, and depression.  Medications include vitamin B12 tamoxifen and believe that  Labs include a 147, glucose 49, albumen 3.5, BUN 10.4 and creatinine 0.9, August 8.  Height: 6 feet 0 inches. Weight: 128 pounds. Usual body weight: 132 pounds per patient. BMI: 17.36.  Patient reports he eats most meals at restaurants states he typically eats 3 meals daily and sometimes has a snack in between. Reports he loves fruits and vegetables and is very knowledgeable about protein containing foods. Reports he eats beef liver for the iron content. Verbalized slight concern about 4 pound weight loss however this is within his usual range over the past several years. Patient spent majority of nutrition consult time verbalizing his anger and dissatisfaction towards the healthcare system in general.  Nutrition diagnosis: Underweight related to progression of GIST as evidenced by BMI of 17.36.  Intervention:  Recommended patient try oral nutrition supplements and provided samples of enu and boost simply complete. Patient accepted chocolate but refused Vanilla.  Educated patient on where these products could be purchased.  Patient denies problems with finances and states he can afford to purchase these. Educated patient to increase calories and protein to promote weight gain. Teach back method used.  Monitoring, evaluation, goals: Patient will work to increase calories and protein to minimize further weight loss.  Next visit: Patient will contact me for questions.  **Disclaimer: This note was dictated with voice recognition software. Similar sounding words can inadvertently be transcribed and this note may contain transcription errors which may not  have been corrected upon publication of note.**

## 2015-06-28 NOTE — Progress Notes (Signed)
I sent in box to [facility] to see my prev notes. The patient never sent back [employer] application for asst with Gleevec.It was mailed on 05/06/15.

## 2015-06-29 ENCOUNTER — Other Ambulatory Visit: Payer: Self-pay

## 2015-06-29 DIAGNOSIS — C50422 Malignant neoplasm of upper-outer quadrant of left male breast: Secondary | ICD-10-CM

## 2015-06-29 MED ORDER — TAMOXIFEN CITRATE 10 MG PO TABS: 10.0000 mg | ORAL_TABLET | Freq: Two times a day (BID) | ORAL | Status: DC

## 2015-07-20 ENCOUNTER — Other Ambulatory Visit: Payer: Self-pay | Admitting: Oncology

## 2015-07-25 ENCOUNTER — Telehealth: Payer: Self-pay | Admitting: Oncology

## 2015-07-25 ENCOUNTER — Other Ambulatory Visit (HOSPITAL_BASED_OUTPATIENT_CLINIC_OR_DEPARTMENT_OTHER): Payer: BLUE CROSS/BLUE SHIELD

## 2015-07-25 ENCOUNTER — Other Ambulatory Visit: Payer: Self-pay | Admitting: Oncology

## 2015-07-25 ENCOUNTER — Ambulatory Visit: Payer: BLUE CROSS/BLUE SHIELD

## 2015-07-25 ENCOUNTER — Encounter: Payer: Self-pay | Admitting: Oncology

## 2015-07-25 ENCOUNTER — Ambulatory Visit (HOSPITAL_BASED_OUTPATIENT_CLINIC_OR_DEPARTMENT_OTHER): Payer: BLUE CROSS/BLUE SHIELD | Admitting: Oncology

## 2015-07-25 VITALS — BP 129/79 | HR 73 | Temp 98.5°F | Resp 18 | Ht 72.0 in | Wt 130.3 lb

## 2015-07-25 DIAGNOSIS — I700 Atherosclerosis of aorta: Secondary | ICD-10-CM

## 2015-07-25 DIAGNOSIS — C494 Malignant neoplasm of connective and soft tissue of abdomen: Secondary | ICD-10-CM | POA: Diagnosis not present

## 2015-07-25 DIAGNOSIS — E538 Deficiency of other specified B group vitamins: Secondary | ICD-10-CM

## 2015-07-25 DIAGNOSIS — R160 Hepatomegaly, not elsewhere classified: Secondary | ICD-10-CM

## 2015-07-25 DIAGNOSIS — Z87891 Personal history of nicotine dependence: Secondary | ICD-10-CM

## 2015-07-25 DIAGNOSIS — Z23 Encounter for immunization: Secondary | ICD-10-CM

## 2015-07-25 DIAGNOSIS — C49A2 Gastrointestinal stromal tumor of stomach: Secondary | ICD-10-CM

## 2015-07-25 DIAGNOSIS — Z9081 Acquired absence of spleen: Secondary | ICD-10-CM

## 2015-07-25 DIAGNOSIS — R634 Abnormal weight loss: Secondary | ICD-10-CM

## 2015-07-25 DIAGNOSIS — C787 Secondary malignant neoplasm of liver and intrahepatic bile duct: Secondary | ICD-10-CM | POA: Diagnosis not present

## 2015-07-25 DIAGNOSIS — C50422 Malignant neoplasm of upper-outer quadrant of left male breast: Secondary | ICD-10-CM

## 2015-07-25 DIAGNOSIS — H539 Unspecified visual disturbance: Secondary | ICD-10-CM

## 2015-07-25 DIAGNOSIS — C641 Malignant neoplasm of right kidney, except renal pelvis: Secondary | ICD-10-CM

## 2015-07-25 DIAGNOSIS — D508 Other iron deficiency anemias: Secondary | ICD-10-CM

## 2015-07-25 DIAGNOSIS — D649 Anemia, unspecified: Secondary | ICD-10-CM

## 2015-07-25 DIAGNOSIS — J930 Spontaneous tension pneumothorax: Secondary | ICD-10-CM

## 2015-07-25 DIAGNOSIS — C49A0 Gastrointestinal stromal tumor, unspecified site: Secondary | ICD-10-CM

## 2015-07-25 DIAGNOSIS — Z85528 Personal history of other malignant neoplasm of kidney: Secondary | ICD-10-CM

## 2015-07-25 LAB — COMPREHENSIVE METABOLIC PANEL (CC13)
ALT: 10 U/L (ref 0–55)
AST: 19 U/L (ref 5–34)
Albumin: 3.4 g/dL — ABNORMAL LOW (ref 3.5–5.0)
Alkaline Phosphatase: 29 U/L — ABNORMAL LOW (ref 40–150)
Anion Gap: 3 mEq/L (ref 3–11)
BUN: 8.9 mg/dL (ref 7.0–26.0)
CHLORIDE: 110 meq/L — AB (ref 98–109)
CO2: 31 meq/L — AB (ref 22–29)
CREATININE: 0.9 mg/dL (ref 0.7–1.3)
Calcium: 8.6 mg/dL (ref 8.4–10.4)
EGFR: 86 mL/min/{1.73_m2} — ABNORMAL LOW (ref >90)
Glucose: 86 mg/dl (ref 70–140)
Potassium: 3.9 mEq/L (ref 3.5–5.1)
Sodium: 144 mEq/L (ref 136–145)
Total Bilirubin: 1.5 mg/dL — ABNORMAL HIGH (ref 0.20–1.20)
Total Protein: 6.1 g/dL — ABNORMAL LOW (ref 6.4–8.3)

## 2015-07-25 LAB — CBC WITH DIFFERENTIAL/PLATELET
BASO%: 1.1 % (ref 0.0–2.0)
Basophils Absolute: 0.1 10*3/uL (ref 0.0–0.1)
EOS%: 1.3 % (ref 0.0–7.0)
Eosinophils Absolute: 0.1 10*3/uL (ref 0.0–0.5)
HEMATOCRIT: 37.8 % — AB (ref 38.4–49.9)
HGB: 12.6 g/dL — ABNORMAL LOW (ref 13.0–17.1)
LYMPH#: 1.8 10*3/uL (ref 0.9–3.3)
LYMPH%: 32.3 % (ref 14.0–49.0)
MCH: 32.1 pg (ref 27.2–33.4)
MCHC: 33.4 g/dL (ref 32.0–36.0)
MCV: 96.2 fL (ref 79.3–98.0)
MONO#: 0.6 10*3/uL (ref 0.1–0.9)
MONO%: 11.2 % (ref 0.0–14.0)
NEUT#: 3.1 10*3/uL (ref 1.5–6.5)
NEUT%: 54.1 % (ref 39.0–75.0)
Platelets: 210 10*3/uL (ref 140–400)
RBC: 3.93 10*6/uL — AB (ref 4.20–5.82)
RDW: 14.4 % (ref 11.0–14.6)
WBC: 5.7 10*3/uL (ref 4.0–10.3)

## 2015-07-25 MED ORDER — INFLUENZA VAC SPLIT QUAD 0.5 ML IM SUSY
0.5000 mL | PREFILLED_SYRINGE | Freq: Once | INTRAMUSCULAR | Status: DC
  Filled 2015-07-25: qty 0.5

## 2015-07-25 MED ORDER — CYANOCOBALAMIN 1000 MCG/ML IJ SOLN: 1000.0000 ug | Freq: Once | INTRAMUSCULAR | Status: DC

## 2015-07-25 NOTE — Progress Notes (Signed)
This RN called patient by name x3 and paged x3 with no return answer. Patient never received flu shot or Vitamin B12 ordered by Dr. Marko Plume. Desk nurse , Barbaraann Share, notified, and she even couldn't locate the patient.

## 2015-07-25 NOTE — Progress Notes (Signed)
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 He had MRI liver at [facility] 05-31-15, follow up with both Drs Morene Rankins and Kendall Flack then and follow up imaging to be done there.  Oncologic history also significant for T1a renal cell carcinoma treated with partial right nephrectomy 2008.   Patient is feeling very well, with no symptoms of concern from GIST or breast cancer standpoints. Energy and appetite are excellent. He has no abdominal or chest wall pain, no other pain. He has had no bleeding. He denies SOB. He is not aware of any changes in either breast.    No central catheter BRCA negative Flu vaccine today  ONCOLOGIC HISTORY LEFT BREAST CANCER Patient had been aware of palpable mass adjacent to left areola for possibly 2-3 months, which had not changed that he could tell during that time. He had bilateral mammograms and left US done at [facility] on 08-28-13, then excisional biopsy by Dr Brantley Stage on 09-23-13. 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 He had re-excision with evaluation of sentinel nodes on 10-14-13 ([id number]), with no residual malignancy in the specimen and 6 sentinel nodes negative. MDBC consensus was that extent of surgery was felt to be adequate and radiation oncology did not recommend additional radiation therapy; genetics testing was negative 01-2014 and bilateral mastectomies not recommended. He began tamoxifen  10-20-14, taking doses away from gleevec in attempt to minimize possible decrease in active endoxifen metabolite of tamoxifen.  GIST was initially disgnosed 2007, with subtotal gastrectomy with Roux-en-Y, then briefly on study with [place] which he did not tolerate due to marked elevations in LFTs. He had recurrent GIST in late 2012 with further surgery by Dr.Levine March 2011, then rapid elevation of LFTs with 50% dosing (200 mg/d) gleevec, with gleevec again discontinued after short period in 2011. He had further progression in June 2012, did tolerate gleevec at 176m/d with initial prednisone, with adequate shrinkage of tumor to allow surgery by Dr. LClovis RileyDec 6, 2012. At laparotomy he had extensive adhesions but no gross peritoneal, hepatic or bowel involvement. The mass itself was cystic and soft and not densely adherent to any visceral structures. Pathology([facility] [phone] recurrent GIST 8.9 cm with resection margins not involved and comment that microscopically this exhibits an epithelial and glandular growth pattern which differs from previous specimens; nuclei medium-sized and bland; immunohistochemical panel postive for c-kit. No mitotic index is reported. He saw Dr.Levine last in Dec 2012. [facility], and PET Jan 2011. He subsequently did not have scans until 12-2014 due to insurance constraints. CT CAP 12-30-14 had new 2.4 x 2 cm lesion in right hepatic lobe, stable 3 mm and 2 mm right middle lobe lung nodules and no axillary or mediastinal adenopathy. The liver lesion was confirmed GIST by [facility] 01[facility]21[facility]. Gleevec was increased to 100 mg tid on ~ 01-27-15. [facility]. [facility], 5000cGy in 5 fractions completed 03-18-15.   Mr.Mccroskey has needed B12 injections due to gastric/ bowel resection. He was iron deficient related to surgeries and  blood draws, previously had marked skin reaction to feraheme, but did tolerate inferon on 12-10-2011 and 07-26-2014 with no difficulty.   RENAL CANCER history of T1a renal cell carcinoma, treated with partial right nephrectomy in 2008 and not active since then    Review of systems as above, also: No fever or symptoms of infection. Bowels move regularly. No extremity swelling Remainder of 10 point Review of Systems negative.  Objective:  Vital signs in last 24 hours:  BP 129/79 mmHg  Pulse 73  Temp(Src) 98.5 F (36.9 C) (Oral)  Resp 18  Ht 6' (1.829 m)  Wt 130 lb 4.8 oz (59.104 kg)  BMI 17.67 kg/m2  SpO2 100% Weight is up 1 lb Alert, oriented and appropriate. Easily and quickly mobile. Talkative, looks comfortable, respirations not labored.   HEENT:PERRL, sclerae not icteric. Oral mucosa moist without lesions, posterior pharynx clear.  Neck supple. No JVD.  Lymphatics:no cervical,supraclavicular, right axillary adenopathy. Small cluster of left axillary nodes medially not as noticeable now Resp: clear to auscultation bilaterally and normal percussion bilaterally Cardio: regular rate and rhythm. No gallop. GI: soft, nontender, not distended, no increased fullness RUQ. Normally active bowel sounds. Multiple surgical incisions not remarkable. Musculoskeletal/ Extremities: without pitting edema, cords, tenderness Neuro: no peripheral neuropathy. Otherwise nonfocal Skin without rash, ecchymosis, petechiae Breasts: right without dominant mass, skin or nipple findings. Left lumpectomy scar well healed, no palpable mass, no skin or nipple findings. Right axilla benign, left as above. No swelling either UE.   Lab Results:  Results for orders placed or performed in visit on 07/25/15  CBC with Differential  Result Value Ref Range   WBC 5.7 4.0 - 10.3 10e3/uL   NEUT# 3.1 1.5 - 6.5 10e3/uL   HGB 12.6 (L) 13.0 - 17.1 g/dL   HCT 37.8 (L) 38.4 - 49.9 %   Platelets 210 140 - 400 10e3/uL    MCV 96.2 79.3 - 98.0 fL   MCH 32.1 27.2 - 33.4 pg   MCHC 33.4 32.0 - 36.0 g/dL   RBC 3.93 (L) 4.20 - 5.82 10e6/uL   RDW 14.4 11.0 - 14.6 %   lymph# 1.8 0.9 - 3.3 10e3/uL   MONO# 0.6 0.1 - 0.9 10e3/uL   Eosinophils Absolute 0.1 0.0 - 0.5 10e3/uL   Basophils Absolute 0.1 0.0 - 0.1 10e3/uL   NEUT% 54.1 39.0 - 75.0 %   LYMPH% 32.3 14.0 - 49.0 %   MONO% 11.2 0.0 - 14.0 %   EOS% 1.3 0.0 - 7.0 %   BASO% 1.1 0.0 - 2.0 %  Comprehensive metabolic panel (Cmet) - [facility]  Result Value Ref Range   Sodium 144 136 - 145 mEq/L   Potassium 3.9 3.5 - 5.1 mEq/L   Chloride 110 (H) 98 - 109 mEq/L   CO2 31 (H) 22 - 29 mEq/L   Glucose 86 70 - 140 mg/dl   BUN 8.9 7.0 - 26.0 mg/dL   Creatinine 0.9 0.7 - 1.3 mg/dL   Total Bilirubin 1.50 (H) 0.20 - 1.20 mg/dL   Alkaline Phosphatase 29 (L) 40 - 150 U/L   AST 19 5 - 34 U/L   ALT 10 0 - 55 U/L   Total Protein 6.1 (L) 6.4 - 8.3 g/dL   Albumin 3.4 (L) 3.5 - 5.0 g/dL   Calcium 8.6 8.4 - 10.4  mg/dL   Anion Gap 3 3 - 11 mEq/L   EGFR 86 (L) >90 ml/min/1.73 m2     Studies/Results:   MR LIVER WWO CONTRAST8/06/2015  [facility]  Result Impression   Slightly increased size of the dominant metastatic cystic lesion in hepatic segment 7 which may reflect treatment related changes. Additional punctate cystic lesions best visualized on the coronal T2 sequence are unchanged. Continued attention on follow-up.  Hypoenhancement of the hepatic parenchyma in segments 7 and 8 adjacent to the above-described lesion likely reflects radiation related hepatocyte dysfunction with superimposed edema.     Result Narrative  MR LIVER WITH AND WITHOUT CONTRAST, 05/31/2015 1:54 PM INDICATION:  C49.4 GIST, malignant ([place])   COMPARISON: MR liver 03/01/2015     TECHNIQUE: Multiplanar, multisequence MR images of the upper abdomen were obtained before and after  intravenous administration 6 mL Eovist, a gadolinium-based contrast.  FINDINGS:  LOWER CHEST Heart:  Normal size. No pericardial effusion. Lungs/pleura: No masses, consolidations, or effusions.  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 Increased size of the T2 hyperintense round lesion measuring up to 2.6 x 3 cm in segment 7 (series 11, image 15), previously 2.3 x 2.1. Hepatic parenchyma surrounding the lesion in segments 7 and 8 demonstrates subtle T2 hyperintensity and marked hypoenhancement relative to adjacent parenchyma on delayed phase imaging.   A few tiny T2 hyperintense lesions near the hepatic dome are stable in size and most evident on the coronal T2 sequence (series 2) are indeterminate. Gallbladder/biliary: Cholecystectomy. Dilation of the common bile duct to 13 mm with smooth tapering distally compatible with the postcholecystectomy state. Spleen: Splenectomy. Pancreas: Normal. Adrenals: Normal. Kidneys: No masses, stones, or obstruction. 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      Medications: I have reviewed the patient's current medications. Continues gleevec reported 100 mg tid and tamoxifen reported 10 mg 2x daily between gleevec doses. Continue B12 monthly Flu vaccine today.   DISCUSSION: Interval history reviewed including [facility] information available in CareEverywhere  Discussed physiologic iron loss in sloughed skin, + blood draws etc. Following, last levels good in 12-2014.  Following visit I have sent message to oral chemo pharmacist re timing of gleevec and tamoxifen doses, which I did not discuss with patient at time of visit.   Assessment/Plan:  1.Progression of GIST with single 2.4 x 2 cm right hepatic lesion found on CT 12-2013 and confirmed by biopsy, not clear if progressed when off  gleevec for several months in last year or progressed on gleevec. Post stereotactic radiation at [facility] 02-2015 and on increased gleevec since then. Plan follow up scans at [facility], may be post treatment changes in liver and no new areas per that scan. 2.GIST: initially diagnosed 2007 with subtotal gastrectomy with Roux-en-Y, then briefly on study with [place] which he did not tolerate due to marked elevations in LFTs. Recurrent GIST with surgery by Dr Clovis Riley March 2011, then rapid elevation of LFTs with 50% dose reduction of Gleevec. Further progression June 2012, tolerated gleevec at 100 mg daily with initial steroids, further surgery Dec 2012. 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  Nodularity in left anterior axillaappeared scarring + non-enlarged lymph nodes. Continue tamoxifen, spacing doses out from [place] due to potential decrease in active tamoxifen metabolite. Due mammograms 08-2015. 4.T1a renal cell carcinoma treated with right partial nephrectomy 2008, not recurrent. Last UA in this system 07-2014. 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 Hemoglobin in good range today 7.long past tobacco. Stable tiny lung nodules, no other pulmonary findings of concern on CT  8.cataracts: recently diagnosed, may be increasing more quickly on tamoxifen.  9.atherosclerotic changes in aorta  10.difficult social situation: now lives ~ [place] from [place]. Insurance more stable now. 11.reported hx manic depressive illness, tho I have never been clear about that diagnosis. Prozac contraindicated with tamoxifen 12.low weight: limited po intake since nearly total gastrectomy 13.flu vaccine today   Patient is  in agreement with plans above. Will try to coordinate mammograms with B12 in Nov. He knows to call prior to scheduled appointments if needed. TIme spent 20 min including >50% counseling and coordination of care.  Cc Drs Morene Rankins and Karn Cassis, MD   07/25/2015, 3:24 PM

## 2015-07-25 NOTE — Telephone Encounter (Signed)
Appointments made and avs printed for patient,pof held for 11/2015

## 2015-07-28 ENCOUNTER — Telehealth: Payer: Self-pay | Admitting: Oncology

## 2015-07-28 ENCOUNTER — Ambulatory Visit (HOSPITAL_BASED_OUTPATIENT_CLINIC_OR_DEPARTMENT_OTHER): Payer: BLUE CROSS/BLUE SHIELD

## 2015-07-28 VITALS — BP 112/74 | HR 67 | Temp 98.4°F | Resp 16

## 2015-07-28 DIAGNOSIS — Z23 Encounter for immunization: Secondary | ICD-10-CM | POA: Diagnosis not present

## 2015-07-28 DIAGNOSIS — E538 Deficiency of other specified B group vitamins: Secondary | ICD-10-CM

## 2015-07-28 DIAGNOSIS — C787 Secondary malignant neoplasm of liver and intrahepatic bile duct: Secondary | ICD-10-CM

## 2015-07-28 MED ORDER — CYANOCOBALAMIN 1000 MCG/ML IJ SOLN
1000.0000 ug | Freq: Once | INTRAMUSCULAR | Status: AC
  Administered 2015-07-28: 1000 ug via INTRAMUSCULAR

## 2015-07-28 MED ORDER — INFLUENZA VAC SPLIT QUAD 0.5 ML IM SUSY
0.5000 mL | PREFILLED_SYRINGE | Freq: Once | INTRAMUSCULAR | Status: AC
  Administered 2015-07-28: 0.5 mL via INTRAMUSCULAR
  Filled 2015-07-28: qty 0.5

## 2015-07-28 MED ORDER — CYANOCOBALAMIN 1000 MCG/ML IJ SOLN
INTRAMUSCULAR | Status: AC
  Filled 2015-07-28: qty 1

## 2015-07-28 NOTE — Telephone Encounter (Signed)
Patient stopped by to make an inj appointment as he left Monday without his inj

## 2015-07-29 ENCOUNTER — Telehealth: Payer: Self-pay

## 2015-07-29 NOTE — Telephone Encounter (Signed)
-----   Message from Gordy Levan, MD sent at 07/25/2015  5:34 PM EDT ----- Labs seen and need follow up: please let him know bilirubin stable at 1.5 and other LFTs not elevated.

## 2015-07-29 NOTE — Telephone Encounter (Signed)
Told Mr. Eisenhour the results of the liver function and bilirubin as noted below by Dr. Marko Plume.

## 2015-07-31 ENCOUNTER — Other Ambulatory Visit: Payer: Self-pay | Admitting: Oncology

## 2015-07-31 DIAGNOSIS — Z1239 Encounter for other screening for malignant neoplasm of breast: Secondary | ICD-10-CM

## 2015-07-31 DIAGNOSIS — Z23 Encounter for immunization: Secondary | ICD-10-CM | POA: Insufficient documentation

## 2015-07-31 DIAGNOSIS — C50422 Malignant neoplasm of upper-outer quadrant of left male breast: Secondary | ICD-10-CM

## 2015-07-31 DIAGNOSIS — C49A2 Gastrointestinal stromal tumor of stomach: Secondary | ICD-10-CM | POA: Insufficient documentation

## 2015-08-03 ENCOUNTER — Encounter: Payer: Self-pay | Admitting: Pharmacist

## 2015-08-03 NOTE — Progress Notes (Signed)
Oral Chemotherapy Pharmacist Encounter   I spoke with patient regarding his dosing schedule for his Gleevec and Tamoxifen at the request of Dr. Marko Plume. Pt is doing well with no complaints. He is tolerating both medications. There is a potential interaction between gleevec and tamoxifen causing increased concentrations and toxicity of tamoxifen. Pt is trying to separate administration times of these two agents as much as possible.   He was taking Gleevec 100 mg TID (total of 300 mg per day) at 8am, 4 pm and Midnight. He was taking Tamoxifen 20 mg at lunch time (12pm).   Instructed pt to change Gleevec to 200 mg (2 tablets) in the morning at 100 mg at bedtime. He will then take tamoxifen midafternoon around 3-4pm. This will give most amount of time inbetween Gleevec and Tamoxifen. If patient tolerates can consider taking gleevec 300 mg once daily. Pt is interested in BID gleevec dosing at this time so will start there.    Thank you,  Montel Clock, PharmD, [facility]

## 2015-08-22 ENCOUNTER — Ambulatory Visit (HOSPITAL_BASED_OUTPATIENT_CLINIC_OR_DEPARTMENT_OTHER): Payer: BLUE CROSS/BLUE SHIELD

## 2015-08-22 VITALS — BP 137/82 | HR 82 | Temp 98.7°F | Resp 18

## 2015-08-22 DIAGNOSIS — C49A0 Gastrointestinal stromal tumor, unspecified site: Secondary | ICD-10-CM

## 2015-08-22 DIAGNOSIS — E538 Deficiency of other specified B group vitamins: Secondary | ICD-10-CM

## 2015-08-22 MED ORDER — CYANOCOBALAMIN 1000 MCG/ML IJ SOLN
1000.0000 ug | Freq: Once | INTRAMUSCULAR | Status: AC
  Administered 2015-08-22: 1000 ug via INTRAMUSCULAR

## 2015-09-07 ENCOUNTER — Telehealth: Payer: Self-pay | Admitting: Oncology

## 2015-09-07 NOTE — Telephone Encounter (Signed)
Called and left a message added dr Marko Plume and lab 12/12/2015

## 2015-09-19 ENCOUNTER — Ambulatory Visit (HOSPITAL_BASED_OUTPATIENT_CLINIC_OR_DEPARTMENT_OTHER): Payer: BLUE CROSS/BLUE SHIELD

## 2015-09-19 VITALS — BP 115/68 | HR 67 | Temp 99.2°F

## 2015-09-19 DIAGNOSIS — E538 Deficiency of other specified B group vitamins: Secondary | ICD-10-CM

## 2015-09-19 MED ORDER — CYANOCOBALAMIN 1000 MCG/ML IJ SOLN
1000.0000 ug | Freq: Once | INTRAMUSCULAR | Status: AC
  Administered 2015-09-19: 1000 ug via INTRAMUSCULAR

## 2015-10-18 ENCOUNTER — Ambulatory Visit (HOSPITAL_BASED_OUTPATIENT_CLINIC_OR_DEPARTMENT_OTHER): Payer: BLUE CROSS/BLUE SHIELD

## 2015-10-18 VITALS — BP 112/66 | HR 72 | Temp 98.9°F

## 2015-10-18 DIAGNOSIS — E538 Deficiency of other specified B group vitamins: Secondary | ICD-10-CM

## 2015-10-18 MED ORDER — CYANOCOBALAMIN 1000 MCG/ML IJ SOLN
1000.0000 ug | Freq: Once | INTRAMUSCULAR | Status: AC
  Administered 2015-10-18: 1000 ug via INTRAMUSCULAR

## 2015-11-14 ENCOUNTER — Ambulatory Visit: Payer: BLUE CROSS/BLUE SHIELD

## 2015-12-08 ENCOUNTER — Telehealth: Payer: Self-pay

## 2015-12-08 DIAGNOSIS — C49A2 Gastrointestinal stromal tumor of stomach: Secondary | ICD-10-CM

## 2015-12-08 MED ORDER — IMATINIB MESYLATE 100 MG PO TABS: 300.0000 mg | ORAL_TABLET | Freq: Every day | ORAL | Status: DC

## 2015-12-08 NOTE — Telephone Encounter (Signed)
Pt is requesting refill on gleevec. He has a helping program from [employer] for brand name, this will be $10/month for 3 months.  S/w Eliezer Lofts pharmD with [facility]. Told her about the helping program. She will update their computer and set up shipment to pt. She stated she will make note of the helping program.

## 2015-12-11 ENCOUNTER — Other Ambulatory Visit: Payer: Self-pay | Admitting: Oncology

## 2015-12-12 ENCOUNTER — Telehealth: Payer: Self-pay | Admitting: Oncology

## 2015-12-12 ENCOUNTER — Encounter: Payer: Self-pay | Admitting: Oncology

## 2015-12-12 ENCOUNTER — Ambulatory Visit (HOSPITAL_BASED_OUTPATIENT_CLINIC_OR_DEPARTMENT_OTHER): Payer: BLUE CROSS/BLUE SHIELD | Admitting: Oncology

## 2015-12-12 ENCOUNTER — Other Ambulatory Visit (HOSPITAL_BASED_OUTPATIENT_CLINIC_OR_DEPARTMENT_OTHER): Payer: BLUE CROSS/BLUE SHIELD

## 2015-12-12 ENCOUNTER — Ambulatory Visit: Payer: BLUE CROSS/BLUE SHIELD

## 2015-12-12 ENCOUNTER — Ambulatory Visit (HOSPITAL_BASED_OUTPATIENT_CLINIC_OR_DEPARTMENT_OTHER): Payer: BLUE CROSS/BLUE SHIELD

## 2015-12-12 VITALS — BP 130/75 | HR 75 | Temp 98.1°F | Resp 18 | Ht 72.0 in | Wt 130.1 lb

## 2015-12-12 DIAGNOSIS — C787 Secondary malignant neoplasm of liver and intrahepatic bile duct: Secondary | ICD-10-CM

## 2015-12-12 DIAGNOSIS — C641 Malignant neoplasm of right kidney, except renal pelvis: Secondary | ICD-10-CM

## 2015-12-12 DIAGNOSIS — C49A2 Gastrointestinal stromal tumor of stomach: Secondary | ICD-10-CM

## 2015-12-12 DIAGNOSIS — C50422 Malignant neoplasm of upper-outer quadrant of left male breast: Secondary | ICD-10-CM

## 2015-12-12 DIAGNOSIS — Z85528 Personal history of other malignant neoplasm of kidney: Secondary | ICD-10-CM

## 2015-12-12 DIAGNOSIS — E538 Deficiency of other specified B group vitamins: Secondary | ICD-10-CM

## 2015-12-12 DIAGNOSIS — D508 Other iron deficiency anemias: Secondary | ICD-10-CM

## 2015-12-12 DIAGNOSIS — Z79899 Other long term (current) drug therapy: Secondary | ICD-10-CM

## 2015-12-12 DIAGNOSIS — Z9081 Acquired absence of spleen: Secondary | ICD-10-CM

## 2015-12-12 DIAGNOSIS — C49A9 Gastrointestinal stromal tumor of other sites: Secondary | ICD-10-CM

## 2015-12-12 DIAGNOSIS — D509 Iron deficiency anemia, unspecified: Secondary | ICD-10-CM | POA: Diagnosis not present

## 2015-12-12 LAB — CBC WITH DIFFERENTIAL/PLATELET
BASO%: 0.8 % (ref 0.0–2.0)
BASOS ABS: 0.1 10*3/uL (ref 0.0–0.1)
EOS ABS: 0.2 10*3/uL (ref 0.0–0.5)
EOS%: 1.7 % (ref 0.0–7.0)
HEMATOCRIT: 40.2 % (ref 38.4–49.9)
HEMOGLOBIN: 13.2 g/dL (ref 13.0–17.1)
LYMPH#: 2.2 10*3/uL (ref 0.9–3.3)
LYMPH%: 23.9 % (ref 14.0–49.0)
MCH: 30.8 pg (ref 27.2–33.4)
MCHC: 32.7 g/dL (ref 32.0–36.0)
MCV: 94.2 fL (ref 79.3–98.0)
MONO#: 1.2 10*3/uL — AB (ref 0.1–0.9)
MONO%: 12.9 % (ref 0.0–14.0)
NEUT%: 60.7 % (ref 39.0–75.0)
NEUTROS ABS: 5.7 10*3/uL (ref 1.5–6.5)
PLATELETS: 261 10*3/uL (ref 140–400)
RBC: 4.27 10*6/uL (ref 4.20–5.82)
RDW: 13.7 % (ref 11.0–14.6)
WBC: 9.4 10*3/uL (ref 4.0–10.3)

## 2015-12-12 LAB — URINALYSIS, MICROSCOPIC - CHCC
BILIRUBIN (URINE): NEGATIVE
Blood: NEGATIVE
GLUCOSE UR CHCC: 1000 mg/dL
KETONES: NEGATIVE mg/dL
Leukocyte Esterase: NEGATIVE
Nitrite: NEGATIVE
PH: 6 (ref 4.6–8.0)
Protein: <30 mg/dL
Specific Gravity, Urine: 1.02 (ref 1.003–1.035)
Urobilinogen, UR: 0.2 mg/dL (ref 0.2–1)

## 2015-12-12 LAB — COMPREHENSIVE METABOLIC PANEL
ALBUMIN: 3.5 g/dL (ref 3.5–5.0)
ALK PHOS: 29 U/L — AB (ref 40–150)
ALT: 11 U/L (ref 0–55)
AST: 17 U/L (ref 5–34)
Anion Gap: 7 mEq/L (ref 3–11)
BILIRUBIN TOTAL: 1.03 mg/dL (ref 0.20–1.20)
BUN: 11.1 mg/dL (ref 7.0–26.0)
CO2: 30 meq/L — AB (ref 22–29)
CREATININE: 1 mg/dL (ref 0.7–1.3)
Calcium: 8.7 mg/dL (ref 8.4–10.4)
Chloride: 103 mEq/L (ref 98–109)
EGFR: 80 mL/min/{1.73_m2} — ABNORMAL LOW (ref >90)
GLUCOSE: 90 mg/dL (ref 70–140)
Potassium: 4 mEq/L (ref 3.5–5.1)
SODIUM: 140 meq/L (ref 136–145)
TOTAL PROTEIN: 6.9 g/dL (ref 6.4–8.3)

## 2015-12-12 LAB — IRON AND TIBC
%SAT: 12 % — ABNORMAL LOW (ref 20–55)
Iron: 44 ug/dL (ref 42–163)
TIBC: 381 ug/dL (ref 202–409)
UIBC: 337 ug/dL (ref 117–376)

## 2015-12-12 MED ORDER — CYANOCOBALAMIN 1000 MCG/ML IJ SOLN
1000.0000 ug | Freq: Once | INTRAMUSCULAR | Status: AC
  Administered 2015-12-12: 1000 ug via INTRAMUSCULAR

## 2015-12-12 NOTE — Progress Notes (Signed)
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 He continues monthly B12 due to bowel resection with GIST. Last IV iron as Infed 07-2014. History of   Last mammograms were at [facility] 08-31-14; he agrees with having these scheduled again now. Last  GIST imaging was MRI liver at [facility] 10-11-15, after which he was seen by both Dr Kendall Flack and Dr Morene Rankins, report reviewed in [facility] and copied in this note below, no progressive disease apparent. He has follow up planned at [facility], including repeat scan.  [facility] pharmacist had discussed timing of gleevec and tamoxifen with patient after my last visit, with recommendation to change gleevec from 100 mg tid to 200 mg AM and 100 mg hs, and subsequently consolidate to once daily dosing if tolerated. Needs to separate tamoxifen out from imatinib as far as possible. Due to ongoing concerns about insurance coverage and price of imatinib, patient tells me that he has been taking only 200 mg of that drug "for a few months", as single daily dose. It is not clear to me how long prior to the Dec 2-16 liver MRI that he may have been on this lower dose.  He hopes to get an alternate supply of imatinib when present assistance from pharmaceutical company completes.   Patient has felt well until plant stake impaled into left thenar eminence area on ~ 12-09-15 while he was gardening. He was seen at ED, begun on Augmentin, no foreign body on xray of the hand. He reports improvement in hand in last 36 hrs, no fever, no increased swelling or erythema,  very little bleeding at time of the injury, not as painful. Otherwise no noted changes in breasts. No abdominal pain or  distension. Appetite good, energy good. No diarrhea with augmentin thus far (3 doses). No respiratory symptoms. No LE swelling. No urinary symptoms. No streaking up left arm. NOTE only sentinel left axillary nodes and no radiation with left breast ca. No bleeding Remainder of 10 point Review of Systems unchanged/ negative  No central catheter BRCA negative Flu vaccine today Infed (inferon) 12-10-11 and 07-26-2014.  (intolerant to feraheme with marked skin reaction)    ONCOLOGIC HISTORY LEFT BREAST CANCER Patient had been aware of palpable mass adjacent to left areola for possibly 2-3 months, which had not changed that he could tell during that time. He had bilateral mammograms and left US done at [facility] on 08-28-13, then excisional biopsy by Dr Brantley Stage on 09-23-13. 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 He had re-excision with evaluation of sentinel nodes on 10-14-13 ([id number]), with no residual malignancy in the specimen and 6 sentinel nodes negative. MDBC consensus was that extent of surgery was felt to be adequate and radiation oncology did not recommend additional radiation therapy; genetics testing was negative 01-2014 and bilateral mastectomies not recommended. He began tamoxifen 10-20-14, taking doses away from gleevec in attempt to minimize possible decrease in active endoxifen metabolite of tamoxifen.  GIST was initially disgnosed 2007, with subtotal gastrectomy with Roux-en-Y, then briefly on study with [place] which he did not tolerate due to marked elevations in LFTs.  He had recurrent GIST in late 2012 with further surgery by Dr.Levine March 2011, then rapid elevation of LFTs with 50% dosing (200 mg/d) gleevec, with gleevec again discontinued after short period in 2011. He had further progression in June 2012, did tolerate  gleevec at 129m/d with initial prednisone, with adequate shrinkage of tumor to allow surgery by Dr. LClovis RileyDec 6, 2012. At laparotomy he had extensive adhesions but no gross peritoneal, hepatic or bowel involvement. The mass itself was cystic and soft and not densely adherent to any visceral structures. Pathology([facility] [id number] recurrent GIST 8.9 cm with resection margins not involved and comment that microscopically this exhibits an epithelial and glandular growth pattern which differs from previous specimens; nuclei medium-sized and bland; immunohistochemical panel postive for c-kit. No mitotic index is reported. He saw Dr.Levine last in Dec 2012. [facility], and PET Jan 2011. He subsequently did not have scans until 12-2014 due to insurance constraints. CT CAP 12-30-14 had new 2.4 x 2 cm lesion in right hepatic lobe, stable 3 mm and 2 mm right middle lobe lung nodules and no axillary or mediastinal adenopathy. The liver lesion was confirmed GIST by [facility] 01[facility]. Gleevec was increased to 100 mg tid on ~ 01-27-15. [facility].8 [facility]. [facility], 5000cGy in 5 fractions completed 03-18-15.   RENAL CANCER history of T1a renal cell carcinoma, treated with partial right nephrectomy in 2008 and not active since then   Mr.Zettel has needed B12 injections due to gastric/ bowel resection. He was iron deficient related to surgeries and blood draws, previously had marked skin reaction to feraheme, but did tolerate inferon on 12-10-2011 and 07-26-2014 with no difficulty.   Objective:  Vital signs in last 24 hours:  BP 130/75 mmHg  Pulse 75  Temp(Src) 98.1 F (36.7 C) (Oral)  Resp 18  Ht 6' (1.829 m)  Wt 130 lb 1.6 oz (59.013 kg)  BMI 17.64 kg/m2  SpO2 99% Weight stable Alert, oriented and appropriate. Easily mobile. Does not appear in any distress  with left hand markedly swollen. Respirations not labored RA. Talkative.   HEENT:PERRL, sclerae not icteric. Oral mucosa moist without lesions, posterior pharynx clear.  Neck supple. No JVD.  Lymphatics:no cervical,supraclavicular, axillary or inguinal adenopathy Resp: clear to auscultation bilaterally and normal percussion bilaterally Cardio: regular rate and rhythm. No gallop. GI: soft, nontender, not distended, RUQ full as previously. Normally active bowel sounds. Surgical incisions not remarkable. Musculoskeletal/ Extremities:left hand site of laceration no drainage or erythema, hand and fingers all swollen tho not erythematous, no streaking or swelling into left forearm. LE without pitting edema, cords, tenderness Neuro: no peripheral neuropathy. Otherwise nonfocal. PSYCH appropriate mood and affect Skin without rash, ecchymosis, petechiae Breasts: right without dominant mass, skin or nipple findings. Left lumpectomy scar not remarkable, no palpable mass, no skn or nipple findings. Axillae benign.   Lab Results:  Results for orders placed or performed in visit on 12/12/15  CBC with Differential  Result Value Ref Range   WBC 9.4 4.0 - 10.3 10e3/uL   NEUT# 5.7 1.5 - 6.5 10e3/uL   HGB 13.2 13.0 - 17.1 g/dL   HCT 40.2 38.4 - 49.9 %   Platelets 261 140 - 400 10e3/uL   MCV 94.2 79.3 - 98.0 fL   MCH 30.8 27.2 - 33.4 pg   MCHC 32.7 32.0 - 36.0  g/dL   RBC 4.27 4.20 - 5.82 10e6/uL   RDW 13.7 11.0 - 14.6 %   lymph# 2.2 0.9 - 3.3 10e3/uL   MONO# 1.2 (H) 0.1 - 0.9 10e3/uL   Eosinophils Absolute 0.2 0.0 - 0.5 10e3/uL   Basophils Absolute 0.1 0.0 - 0.1 10e3/uL   NEUT% 60.7 39.0 - 75.0 %   LYMPH% 23.9 14.0 - 49.0 %   MONO% 12.9 0.0 - 14.0 %   EOS% 1.7 0.0 - 7.0 %   BASO% 0.8 0.0 - 2.0 %  Urinalysis with microscopic - [facility]  Result Value Ref Range   Glucose 1000 Negative mg/dL   Bilirubin (Urine) Negative Negative   Ketones Negative Negative mg/dL   Specific Gravity, Urine 1.020 1.003  - 1.035   Blood Negative Negative   pH 6.0 4.6 - 8.0   Protein < 30 Negative- <30 mg/dL   Urobilinogen, UR 0.2 0.2 - 1 mg/dL   Nitrite Negative Negative   Leukocyte Esterase Negative Negative   RBC / HPF 0-2 0 - 2   WBC, UA 0-2 0 - 2   Bacteria, UA Few Negative- Trace   Epithelial Cells Occasional Negative- Few   Mucus, UA Small Negative- Small  Comprehensive metabolic panel  Result Value Ref Range   Sodium 140 136 - 145 mEq/L   Potassium 4.0 3.5 - 5.1 mEq/L   Chloride 103 98 - 109 mEq/L   CO2 30 (H) 22 - 29 mEq/L   Glucose 90 70 - 140 mg/dl   BUN 11.1 7.0 - 26.0 mg/dL   Creatinine 1.0 0.7 - 1.3 mg/dL   Total Bilirubin 1.03 0.20 - 1.20 mg/dL   Alkaline Phosphatase 29 (L) 40 - 150 U/L   AST 17 5 - 34 U/L   ALT 11 0 - 55 U/L   Total Protein 6.9 6.4 - 8.3 g/dL   Albumin 3.5 3.5 - 5.0 g/dL   Calcium 8.7 8.4 - 10.4 mg/dL   Anion Gap 7 3 - 11 mEq/L   EGFR 80 (L) >90 ml/min/1.73 m2  Iron and TIBC  Result Value Ref Range   Iron 44 42 - 163 ug/dL   TIBC 381 202 - 409 ug/dL   UIBC 337 117 - 376 ug/dL   %SAT 12 (L) 20 - 55 %     Studies/Results:   MR LIVER WWO CONTRAST12/20/2016  [facility]  Result Impression   1.  Similar size of posterior right hepatic lobe cystic lesion with adjacent parenchymal signal changes compatible with treatment-related change. Similar appearance of punctate cystic lesions throughout the right hepatic lobe. Continued attention on follow-up recommended. 2.  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        Result Narrative  MR LIVER WITH AND WITHOUT CONTRAST, 10/11/2015 1:45 PM  INDICATION: Malignant gastrointestinal stromal tumor  COMPARISON: MRI abdomen 05/31/2015  TECHNIQUE: Multiplanar, multisequence MR images of the upper abdomen were obtained before and after intravenous administration of 10 mL MultiHance gadolinium-based  contrast.   FINDINGS:  LOWER CHEST Heart: Normal size. No pericardial effusion. Lungs/pleura: No masses, consolidations, or effusions.  ABDOMEN Liver: Similar appearance of 2.6 x 2.2 cm cystic lesion in the posterior right hepatic lobe with interval decrease adjacent T2 hyperintense parenchymal changes. Adjacent parenchyma demonstrates heterogeneous enhancement, compatible with treatment change. Similar segment 8 hepatic dome lipoma. Similar appearance of few punctate T2 hyperintense lesions throughout the right hepatic lobe.  Gallbladder: Cholecystectomy.  Biliary: Similar fullness of the common bile duct measuring up to 12 mm in diameter. Similar mild fullness of the central intrahepatic biliary tree and residual cystic duct. Distal common duct demonstrates normal tapered. Spleen: Splenectomy. Pancreas: Main pancreatic duct is normal in caliber. 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 Adrenals: Within normal limits. Kidneys: Right renal ptosis with similar surgical changes status post partial nephrectomy. Similar appearance of few tiny bilateral renal cysts. Stomach/bowel: Partial gastrectomy with gastric bypass. No bowel obstruction. Mesentery/peritoneum: Within normal limits. Vasculature: Within normal limits.  MSK: Benign marrow signal.      Medications: I have reviewed the patient's current medications. Dosing of imatinib discussed. Continue tamoxifen as far from imatinib as possible, such that once daily dosing of imatinib preferred. Continue monthly B12 as he cannot absorb po IV iron needs to be Infed (inferon) NOT feraheme.due to severe allergic skin reaction to feraheme. Told him to take the Augmentin every 12 hrs by clock each dose with food    DISCUSSION Iron studies available after visit, will let him know these are just low normal now, expect will do  best with IV iron again in next several months, tho hemoglobin is still in good range.   Mammogram order placed again.  Letter written for him to have available if needed as he looks into other ways to obtain imatinib.  If possible, seems best to continue imatinib at 300 mg daily.     Assessment/Plan:  1.Progression of GIST with single 2.4 x 2 cm right hepatic lesion found on CT 12-2013 and confirmed by biopsy, not clear if progressed when off gleevec for several months prior year or progressed on gleevec. Post stereotactic radiation at [facility] 02-2015 and on increased gleevec at least mostly since then, with MRI liver 09-2015 no apparent progression. Continue Gleevec, extremely expensive even with assistance programs for name brand and for generic.   2.GIST: initially diagnosed 2007 with subtotal gastrectomy with Roux-en-Y, then briefly on study with [place] which he did not tolerate due to marked elevations in LFTs. Recurrent GIST with surgery by Dr Clovis Riley March 2011, then rapid elevation of LFTs with 50% dose reduction of Gleevec. Further progression June 2012, tolerated gleevec at 100 mg daily with initial steroids, further surgery Dec 2012. 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 Nodularity in left anterior axillaappeared scarring + non-enlarged lymph nodes. Continue tamoxifen, spacing doses out from [place] due to potential decrease in active tamoxifen metabolite. Overdue mammograms, last 08-2014, ordered again today. 4.T1a renal cell carcinoma treated with right partial nephrectomy 2008, not recurrent. No blood in urine today. 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 Hemoglobin in good range today  however iron studies resulting after visit lower again. Expect he will need IV iron in next several months. 7.long past tobacco. Stable tiny lung nodules, no other pulmonary findings of concern on last chest imaging 8.left hand impaled on gardening stake 4 days ago, reportedly improving on augmentin after evaluation in ED. No evidence of cellulitis in the arm, only sentinel nodes and no RT on that side 9.cataracts: recently diagnosed, may be increasing more quickly on tamoxifen.  10.atherosclerotic changes in  aorta by imaging 11.difficult social situation: now lives ~ an hour from [place]. Insurance more stable now but coverage for gleevec/ imatinib very difficult 12.reported hx manic depressive illness, tho I have never been clear about that diagnosis. Prozac contraindicated with tamoxifen 13.low weight: limited po intake since nearly total gastrectomy, but stable 14.flu vaccine 07-25-15 15.post splenectomy with GIST surgery  All questions answered. B12 orders in place. Will let him know results of iron. Cc Drs Kendall Flack and [place] Time spent 20 min including >50% counseling and coordination of care.    Henleigh Robello P, MD   12/12/2015, 10:06 PM

## 2015-12-12 NOTE — Patient Instructions (Signed)

## 2015-12-12 NOTE — Telephone Encounter (Signed)
Appointments made and avs printed °

## 2015-12-14 ENCOUNTER — Telehealth: Payer: Self-pay

## 2015-12-14 DIAGNOSIS — Z79899 Other long term (current) drug therapy: Secondary | ICD-10-CM | POA: Insufficient documentation

## 2015-12-14 DIAGNOSIS — C649 Malignant neoplasm of unspecified kidney, except renal pelvis: Secondary | ICD-10-CM | POA: Insufficient documentation

## 2015-12-14 DIAGNOSIS — D500 Iron deficiency anemia secondary to blood loss (chronic): Secondary | ICD-10-CM | POA: Insufficient documentation

## 2015-12-14 DIAGNOSIS — C49A0 Gastrointestinal stromal tumor, unspecified site: Secondary | ICD-10-CM

## 2015-12-14 DIAGNOSIS — D508 Other iron deficiency anemias: Secondary | ICD-10-CM | POA: Insufficient documentation

## 2015-12-14 DIAGNOSIS — C49A2 Gastrointestinal stromal tumor of stomach: Secondary | ICD-10-CM

## 2015-12-14 NOTE — Telephone Encounter (Signed)
-----   Message from Gordy Levan, MD sent at 12/14/2015 12:36 PM EST ----- Labs seen and need follow up please let him know no elevated liver chemistries and no blood in urine. Iron is just barely in normal range (serum iron 44 with normal range 42-163, and %saturation 12, with that normal 20-55%), so likely will need to do some IV iron in next several months; hemoglobin still good at 13.  If he wants to set up IV iron with one of the B12 injections in next several months, that is fine, or we can wait until we see his hemoglobin trending down. (IV iron needs to be Infed as he had reaction to [facility] but does tolerate Infed)

## 2015-12-14 NOTE — Telephone Encounter (Signed)
Discussed results of labs as noted below by Dr. Marko Plume. Henry [name] is agreeable to IV Infed ~the week of 01-09-16 with Lb and B12 injection. Sent POF to scheduling.

## 2015-12-16 ENCOUNTER — Telehealth: Payer: Self-pay | Admitting: Oncology

## 2015-12-16 NOTE — Telephone Encounter (Signed)
S/w pt, gave apt for 3/21 iron and confirmed appt for 3/20.

## 2015-12-19 ENCOUNTER — Telehealth: Payer: Self-pay | Admitting: Oncology

## 2015-12-19 NOTE — Telephone Encounter (Signed)
Returned patient call re moving 3/31 infusion to 3/20 with lab/injection due to conflict with mri appointment at [facility]. Moved 3/21 inf to 3/20 and adjusted 3/20 lab/inj appointments. Spoke with patient confirming the change and new date/time for lab/inf/inj 3/20 @ 8 am.

## 2015-12-20 ENCOUNTER — Ambulatory Visit
Admission: RE | Admit: 2015-12-20 | Discharge: 2015-12-20 | Disposition: A | Discharge status: Home / Self Care | Payer: BLUE CROSS/BLUE SHIELD | Source: Ambulatory Visit | Attending: Oncology | Admitting: Oncology

## 2015-12-20 ENCOUNTER — Other Ambulatory Visit: Payer: Self-pay | Admitting: Oncology

## 2015-12-20 ENCOUNTER — Encounter: Payer: Self-pay | Admitting: Oncology

## 2015-12-20 DIAGNOSIS — C50422 Malignant neoplasm of upper-outer quadrant of left male breast: Secondary | ICD-10-CM

## 2015-12-22 ENCOUNTER — Other Ambulatory Visit: Payer: Self-pay | Admitting: Oncology

## 2016-01-04 NOTE — Telephone Encounter (Addendum)
Mr. Fallone is fine with cancelling Iron infusion for 01-09-16. Mr. Manjarres decreased the dose of gleevec to 200mg   In December of 2016. Requested that Mr. hanchey call back to the office to let Dr. Mariana Kaufman nurse know if he is going to keep lab appointment 01-09-16 at 3:00pm followed by B-12 injection. Mr. Hedges and delay his MRI from 01-10-16 for 1-2 moths per Dr. Marko Plume.

## 2016-01-04 NOTE — Telephone Encounter (Signed)
Left a message in patient's VM requesting that he call back to the office to let Dr. Mariana Kaufman nurse know if he is agreeable to not receiving the IV iron 01-09-16 and as to when he decreased his Gleevec dose to 200 mg as noted in My chart message above.

## 2016-01-05 ENCOUNTER — Encounter: Payer: Self-pay | Admitting: Oncology

## 2016-01-05 NOTE — Telephone Encounter (Signed)
LM in Henry [name] vm that His lab appointment on 01-09-16 is at 3 pm and B-12 Injection is at 3:30pm. Dr. Marko Plume will not give the iron on 01-09-16  as his Hgb is too  high to dose the Iron.

## 2016-01-05 NOTE — Telephone Encounter (Signed)
Left a message in Mr. Leising VM that Dr. Marko Plume is fine with keeping the MRI on 01-10-16 if he chooses.

## 2016-01-09 ENCOUNTER — Other Ambulatory Visit: Payer: BLUE CROSS/BLUE SHIELD

## 2016-01-09 ENCOUNTER — Other Ambulatory Visit (HOSPITAL_BASED_OUTPATIENT_CLINIC_OR_DEPARTMENT_OTHER): Payer: BLUE CROSS/BLUE SHIELD

## 2016-01-09 ENCOUNTER — Ambulatory Visit: Payer: BLUE CROSS/BLUE SHIELD

## 2016-01-09 ENCOUNTER — Ambulatory Visit (HOSPITAL_BASED_OUTPATIENT_CLINIC_OR_DEPARTMENT_OTHER): Payer: BLUE CROSS/BLUE SHIELD

## 2016-01-09 VITALS — BP 118/76 | HR 55 | Temp 98.2°F

## 2016-01-09 DIAGNOSIS — E538 Deficiency of other specified B group vitamins: Secondary | ICD-10-CM | POA: Diagnosis not present

## 2016-01-09 DIAGNOSIS — C49A2 Gastrointestinal stromal tumor of stomach: Secondary | ICD-10-CM | POA: Diagnosis not present

## 2016-01-09 LAB — CBC WITH DIFFERENTIAL/PLATELET
BASO%: 0.7 % (ref 0.0–2.0)
Basophils Absolute: 0 10*3/uL (ref 0.0–0.1)
EOS ABS: 0.2 10*3/uL (ref 0.0–0.5)
EOS%: 3 % (ref 0.0–7.0)
HEMATOCRIT: 37.2 % — AB (ref 38.4–49.9)
HGB: 12 g/dL — ABNORMAL LOW (ref 13.0–17.1)
LYMPH#: 2.3 10*3/uL (ref 0.9–3.3)
LYMPH%: 37.6 % (ref 14.0–49.0)
MCH: 30.5 pg (ref 27.2–33.4)
MCHC: 32.3 g/dL (ref 32.0–36.0)
MCV: 94.4 fL (ref 79.3–98.0)
MONO#: 0.6 10*3/uL (ref 0.1–0.9)
MONO%: 9.9 % (ref 0.0–14.0)
NEUT%: 48.8 % (ref 39.0–75.0)
NEUTROS ABS: 2.9 10*3/uL (ref 1.5–6.5)
PLATELETS: 231 10*3/uL (ref 140–400)
RBC: 3.94 10*6/uL — AB (ref 4.20–5.82)
RDW: 14 % (ref 11.0–14.6)
WBC: 6 10*3/uL (ref 4.0–10.3)

## 2016-01-09 LAB — COMPREHENSIVE METABOLIC PANEL
ALT: 13 U/L (ref 0–55)
ANION GAP: 6 meq/L (ref 3–11)
AST: 21 U/L (ref 5–34)
Albumin: 3.4 g/dL — ABNORMAL LOW (ref 3.5–5.0)
Alkaline Phosphatase: 26 U/L — ABNORMAL LOW (ref 40–150)
BILIRUBIN TOTAL: 0.9 mg/dL (ref 0.20–1.20)
BUN: 9.4 mg/dL (ref 7.0–26.0)
CALCIUM: 8.3 mg/dL — AB (ref 8.4–10.4)
CO2: 31 meq/L — AB (ref 22–29)
CREATININE: 0.9 mg/dL (ref 0.7–1.3)
Chloride: 105 mEq/L (ref 98–109)
EGFR: 86 mL/min/{1.73_m2} — AB (ref >90)
Glucose: 96 mg/dl (ref 70–140)
Potassium: 3.6 mEq/L (ref 3.5–5.1)
Sodium: 142 mEq/L (ref 136–145)
TOTAL PROTEIN: 6.4 g/dL (ref 6.4–8.3)

## 2016-01-09 MED ORDER — CYANOCOBALAMIN 1000 MCG/ML IJ SOLN
1000.0000 ug | Freq: Once | INTRAMUSCULAR | Status: AC
  Administered 2016-01-09: 1000 ug via INTRAMUSCULAR

## 2016-01-10 ENCOUNTER — Ambulatory Visit: Payer: BLUE CROSS/BLUE SHIELD

## 2016-01-10 ENCOUNTER — Other Ambulatory Visit: Payer: Self-pay | Admitting: Oncology

## 2016-01-10 ENCOUNTER — Ambulatory Visit
Admission: RE | Admit: 2016-01-10 | Discharge: 2016-01-10 | Disposition: A | Discharge status: Home / Self Care | Payer: BLUE CROSS/BLUE SHIELD | Source: Ambulatory Visit | Attending: Oncology | Admitting: Oncology

## 2016-01-10 DIAGNOSIS — C50422 Malignant neoplasm of upper-outer quadrant of left male breast: Secondary | ICD-10-CM

## 2016-01-16 ENCOUNTER — Ambulatory Visit
Admission: RE | Admit: 2016-01-16 | Discharge: 2016-01-16 | Disposition: A | Discharge status: Home / Self Care | Payer: BLUE CROSS/BLUE SHIELD | Source: Ambulatory Visit | Attending: Oncology | Admitting: Oncology

## 2016-01-16 DIAGNOSIS — C50422 Malignant neoplasm of upper-outer quadrant of left male breast: Secondary | ICD-10-CM

## 2016-02-03 ENCOUNTER — Other Ambulatory Visit: Payer: Self-pay

## 2016-02-03 DIAGNOSIS — C49A0 Gastrointestinal stromal tumor, unspecified site: Secondary | ICD-10-CM

## 2016-02-06 ENCOUNTER — Other Ambulatory Visit: Payer: Self-pay

## 2016-02-06 ENCOUNTER — Ambulatory Visit (HOSPITAL_BASED_OUTPATIENT_CLINIC_OR_DEPARTMENT_OTHER): Payer: BLUE CROSS/BLUE SHIELD

## 2016-02-06 ENCOUNTER — Other Ambulatory Visit (HOSPITAL_BASED_OUTPATIENT_CLINIC_OR_DEPARTMENT_OTHER): Payer: BLUE CROSS/BLUE SHIELD

## 2016-02-06 VITALS — BP 128/82 | HR 69 | Temp 98.2°F | Resp 18

## 2016-02-06 DIAGNOSIS — C49A0 Gastrointestinal stromal tumor, unspecified site: Secondary | ICD-10-CM | POA: Diagnosis not present

## 2016-02-06 DIAGNOSIS — E538 Deficiency of other specified B group vitamins: Secondary | ICD-10-CM | POA: Diagnosis not present

## 2016-02-06 DIAGNOSIS — C50422 Malignant neoplasm of upper-outer quadrant of left male breast: Secondary | ICD-10-CM

## 2016-02-06 LAB — CBC WITH DIFFERENTIAL/PLATELET
BASO%: 0.9 % (ref 0.0–2.0)
Basophils Absolute: 0.1 10*3/uL (ref 0.0–0.1)
EOS%: 1.7 % (ref 0.0–7.0)
Eosinophils Absolute: 0.1 10*3/uL (ref 0.0–0.5)
HCT: 34.5 % — ABNORMAL LOW (ref 38.4–49.9)
HGB: 11.4 g/dL — ABNORMAL LOW (ref 13.0–17.1)
LYMPH%: 37.4 % (ref 14.0–49.0)
MCH: 30.6 pg (ref 27.2–33.4)
MCHC: 33 g/dL (ref 32.0–36.0)
MCV: 92.7 fL (ref 79.3–98.0)
MONO#: 0.8 10*3/uL (ref 0.1–0.9)
MONO%: 12.6 % (ref 0.0–14.0)
NEUT#: 3.1 10*3/uL (ref 1.5–6.5)
NEUT%: 47.4 % (ref 39.0–75.0)
Platelets: 239 10*3/uL (ref 140–400)
RBC: 3.72 10*6/uL — ABNORMAL LOW (ref 4.20–5.82)
RDW: 14.3 % (ref 11.0–14.6)
WBC: 6.4 10*3/uL (ref 4.0–10.3)
lymph#: 2.4 10*3/uL (ref 0.9–3.3)

## 2016-02-06 LAB — COMPREHENSIVE METABOLIC PANEL
ALT: <9 U/L (ref 0–55)
AST: 18 U/L (ref 5–34)
Albumin: 3.4 g/dL — ABNORMAL LOW (ref 3.5–5.0)
Alkaline Phosphatase: 26 U/L — ABNORMAL LOW (ref 40–150)
Anion Gap: 7 mEq/L (ref 3–11)
BUN: 9.9 mg/dL (ref 7.0–26.0)
CO2: 30 mEq/L — ABNORMAL HIGH (ref 22–29)
Calcium: 8.6 mg/dL (ref 8.4–10.4)
Chloride: 109 mEq/L (ref 98–109)
Creatinine: 1.1 mg/dL (ref 0.7–1.3)
EGFR: 74 mL/min/{1.73_m2} — ABNORMAL LOW (ref >90)
Glucose: 71 mg/dl (ref 70–140)
Potassium: 3.4 mEq/L — ABNORMAL LOW (ref 3.5–5.1)
Sodium: 146 mEq/L — ABNORMAL HIGH (ref 136–145)
Total Bilirubin: 1.23 mg/dL — ABNORMAL HIGH (ref 0.20–1.20)
Total Protein: 6.3 g/dL — ABNORMAL LOW (ref 6.4–8.3)

## 2016-02-06 MED ORDER — CYANOCOBALAMIN 1000 MCG/ML IJ SOLN
1000.0000 ug | Freq: Once | INTRAMUSCULAR | Status: AC
  Administered 2016-02-06: 1000 ug via INTRAMUSCULAR

## 2016-02-06 MED ORDER — TAMOXIFEN CITRATE 10 MG PO TABS: 10.0000 mg | ORAL_TABLET | Freq: Two times a day (BID) | ORAL | Status: DC

## 2016-02-06 NOTE — Telephone Encounter (Signed)
Was refilling tamoxifen and noted last Rx was written in Sept. Was asking him if he is taking tamoxifen BID. The connection was horrible and pt has an appt today at [facility]. Will ask him at that time.

## 2016-02-06 NOTE — Patient Instructions (Signed)

## 2016-03-09 ENCOUNTER — Other Ambulatory Visit: Payer: Self-pay

## 2016-03-09 DIAGNOSIS — C49A0 Gastrointestinal stromal tumor, unspecified site: Secondary | ICD-10-CM

## 2016-03-12 ENCOUNTER — Ambulatory Visit (HOSPITAL_BASED_OUTPATIENT_CLINIC_OR_DEPARTMENT_OTHER): Payer: BLUE CROSS/BLUE SHIELD

## 2016-03-12 VITALS — BP 122/70 | HR 60 | Resp 18

## 2016-03-12 DIAGNOSIS — C49A0 Gastrointestinal stromal tumor, unspecified site: Secondary | ICD-10-CM

## 2016-03-12 DIAGNOSIS — E538 Deficiency of other specified B group vitamins: Secondary | ICD-10-CM

## 2016-03-12 LAB — COMPREHENSIVE METABOLIC PANEL
ALT: 10 U/L (ref 0–55)
AST: 18 U/L (ref 5–34)
Albumin: 3.1 g/dL — ABNORMAL LOW (ref 3.5–5.0)
Alkaline Phosphatase: 23 U/L — ABNORMAL LOW (ref 40–150)
Anion Gap: 6 mEq/L (ref 3–11)
BUN: 8.4 mg/dL (ref 7.0–26.0)
CHLORIDE: 108 meq/L (ref 98–109)
CO2: 28 meq/L (ref 22–29)
Calcium: 8 mg/dL — ABNORMAL LOW (ref 8.4–10.4)
Creatinine: 1 mg/dL (ref 0.7–1.3)
EGFR: 78 mL/min/{1.73_m2} — ABNORMAL LOW (ref >90)
GLUCOSE: 146 mg/dL — AB (ref 70–140)
POTASSIUM: 3.8 meq/L (ref 3.5–5.1)
SODIUM: 142 meq/L (ref 136–145)
Total Bilirubin: 0.94 mg/dL (ref 0.20–1.20)
Total Protein: 5.7 g/dL — ABNORMAL LOW (ref 6.4–8.3)

## 2016-03-12 LAB — CBC WITH DIFFERENTIAL/PLATELET
BASO%: 1 % (ref 0.0–2.0)
BASOS ABS: 0.1 10*3/uL (ref 0.0–0.1)
EOS%: 4 % (ref 0.0–7.0)
Eosinophils Absolute: 0.2 10*3/uL (ref 0.0–0.5)
HCT: 33.8 % — ABNORMAL LOW (ref 38.4–49.9)
HGB: 11 g/dL — ABNORMAL LOW (ref 13.0–17.1)
LYMPH%: 43.5 % (ref 14.0–49.0)
MCH: 30 pg (ref 27.2–33.4)
MCHC: 32.5 g/dL (ref 32.0–36.0)
MCV: 92.1 fL (ref 79.3–98.0)
MONO#: 0.7 10*3/uL (ref 0.1–0.9)
MONO%: 13.8 % (ref 0.0–14.0)
NEUT#: 1.9 10*3/uL (ref 1.5–6.5)
NEUT%: 37.7 % — AB (ref 39.0–75.0)
Platelets: 204 10*3/uL (ref 140–400)
RBC: 3.67 10*6/uL — AB (ref 4.20–5.82)
RDW: 15.2 % — ABNORMAL HIGH (ref 11.0–14.6)
WBC: 4.9 10*3/uL (ref 4.0–10.3)
lymph#: 2.2 10*3/uL (ref 0.9–3.3)

## 2016-03-12 MED ORDER — CYANOCOBALAMIN 1000 MCG/ML IJ SOLN
1000.0000 ug | Freq: Once | INTRAMUSCULAR | Status: AC
  Administered 2016-03-12: 1000 ug via INTRAMUSCULAR

## 2016-03-13 ENCOUNTER — Telehealth: Payer: Self-pay

## 2016-03-13 ENCOUNTER — Encounter: Payer: Self-pay | Admitting: Oncology

## 2016-03-13 NOTE — Telephone Encounter (Signed)
New PCP information attached tr phone call with lab results from 03-13-16.

## 2016-03-13 NOTE — Telephone Encounter (Signed)
Non-Urgent Medical Question  Message [phone]   From  KELII WARMATH   To  Gordy Levan, MD   Sent  03/13/2016 11:25 AM     New family physician:   Darla Lesches, MD  [place] [address]  [address]   [phone] office telephone  [phone] office fax      Responsible Party    [place] Nurse Cc No one has taken responsibility for this message.     No actions have been taken on this message.

## 2016-03-13 NOTE — Telephone Encounter (Signed)
-----   Message from Gordy Levan, MD sent at 03/13/2016  9:29 AM EDT ----- Please let him know hemoglobin is drifting down, now 11. Doubt he is symptomatic from this yet, but I am checking with our pharmacists to see if Infed appropriate yet. No elevated liver chemistries, albumin and protein a little lower (calcium corrects for the albumin). If you cannot reach him, please mail him CBC and CMET results with these comments, and he can call us back if questions    thanks

## 2016-03-13 NOTE — Telephone Encounter (Signed)
Told [name] the results of the labs as noted below by Dr. Marko [name]. [name] states that he occasionally experiences days where he is more fatigued then others. He has a new PCP on [place].  Dr. Georgiana Spinner. [name] will send the contact information via MyChart to Dr. Marko [name]. [name] inquired about having labs for Dr. Marko [name] and B12  injections done at PCP so he does not have to drive to [place] as often.  He would not pursue this if it would jeopardize being  followed by Dr. Marko [name].

## 2016-03-13 NOTE — Telephone Encounter (Signed)
Below is PCP contact information. Next Lab and B-12 injection appointments are 6-19 and 05-07-16. Should he schedule these with Dr. Jaclyn Prime?   Keep lab, visit Dr. Edwyna Shell. , and injection appointments at [facility] on 06-21-16?

## 2016-03-13 NOTE — Telephone Encounter (Signed)
Fine to have labs and B12 by new PCP locally - that is a good idea. Still can be followed otherwise at [facility]. We will let him know about the IV iron when pharmacists and I figure it out. Need to send records to new PCP when we get that contact information. I am glad to let HIM know what to send, so please ask me to do that when we have contact information  thanks

## 2016-03-16 ENCOUNTER — Encounter: Payer: Self-pay | Admitting: Oncology

## 2016-03-16 ENCOUNTER — Other Ambulatory Visit: Payer: Self-pay | Admitting: Oncology

## 2016-03-16 DIAGNOSIS — D508 Other iron deficiency anemias: Secondary | ICD-10-CM

## 2016-03-16 MED ORDER — SODIUM CHLORIDE 0.9 % IV SOLN: 1500.0000 mg | Freq: Once | INTRAVENOUS | Status: DC

## 2016-03-16 NOTE — Progress Notes (Signed)
Medical Oncology  MD discussed Infed dosing with [facility] pharmacist. Calculations now appropriate to repeat Infed, dose suggested 1500 mg if done in next 1-2 months based on rate of decline in Hgb and vial size Infed (usual doses 1200-2000 mg, vials 100 mg).   Message to RN to follow up with patient next week.  Orders placed, message to managed care in case preauth needed. Note severe rash to Feraheme in past but has tolerated Infed, medically necessary for Infed as ordered with this history.  Dx: other iron deficiency anemia due to extensive GI resection and inability to absorb oral iron.  Godfrey Pick, MD

## 2016-03-27 NOTE — Telephone Encounter (Signed)
Message  Received: 5 days ago    Gordy Levan, MD  Baruch Merl, RN           In addition to other scheduling with him, I don't know if you have this Infed message:   Per [facility] pharmacist, reasonable based on present calculations to give IV Infed in next 1-2 months.   I have put in orders so managed care can preauth if required, fine to move date if needed.  Can coordinate with B12 +/- lab here if he wants, or just do the Infed and have PCP do the other.   No POF sent now, so RN can discuss with patient 5-31 or after  thanks

## 2016-03-29 ENCOUNTER — Encounter: Payer: Self-pay | Admitting: Oncology

## 2016-03-30 ENCOUNTER — Telehealth: Payer: Self-pay

## 2016-03-30 NOTE — Telephone Encounter (Signed)
ENCOUNTER OPENED IN ERROR

## 2016-03-30 NOTE — Telephone Encounter (Signed)
Faxed orders for labs and B12 injection to be done in June and July of 2017 ay Dr. Raul Del office as requested by Mr. Amesbury. Pt is in agreement to IV Infed with Dr. Mariana Kaufman visit on 8-21-1. POF sent to scheduling.

## 2016-04-09 ENCOUNTER — Ambulatory Visit: Payer: BLUE CROSS/BLUE SHIELD

## 2016-04-09 ENCOUNTER — Other Ambulatory Visit: Payer: BLUE CROSS/BLUE SHIELD

## 2016-04-16 ENCOUNTER — Other Ambulatory Visit: Payer: Self-pay | Admitting: *Deleted

## 2016-04-16 DIAGNOSIS — C49A2 Gastrointestinal stromal tumor of stomach: Secondary | ICD-10-CM

## 2016-04-16 MED ORDER — IMATINIB MESYLATE 100 MG PO TABS: 300.0000 mg | ORAL_TABLET | Freq: Every day | ORAL | Status: DC

## 2016-05-07 ENCOUNTER — Ambulatory Visit: Payer: BLUE CROSS/BLUE SHIELD

## 2016-05-07 ENCOUNTER — Other Ambulatory Visit: Payer: BLUE CROSS/BLUE SHIELD

## 2016-05-11 ENCOUNTER — Other Ambulatory Visit: Payer: Self-pay | Admitting: Oncology

## 2016-05-11 DIAGNOSIS — C50422 Malignant neoplasm of upper-outer quadrant of left male breast: Secondary | ICD-10-CM

## 2016-05-12 ENCOUNTER — Other Ambulatory Visit: Payer: Self-pay | Admitting: Oncology

## 2016-05-14 ENCOUNTER — Other Ambulatory Visit: Payer: Self-pay

## 2016-05-14 DIAGNOSIS — C49A2 Gastrointestinal stromal tumor of stomach: Secondary | ICD-10-CM

## 2016-05-14 MED ORDER — IMATINIB MESYLATE 100 MG PO TABS: 300.0000 mg | ORAL_TABLET | Freq: Every day | ORAL | 6 refills | Status: DC

## 2016-06-10 ENCOUNTER — Other Ambulatory Visit: Payer: Self-pay | Admitting: Oncology

## 2016-06-11 ENCOUNTER — Ambulatory Visit (HOSPITAL_BASED_OUTPATIENT_CLINIC_OR_DEPARTMENT_OTHER): Payer: BLUE CROSS/BLUE SHIELD | Admitting: Oncology

## 2016-06-11 ENCOUNTER — Ambulatory Visit: Payer: BLUE CROSS/BLUE SHIELD

## 2016-06-11 ENCOUNTER — Other Ambulatory Visit (HOSPITAL_BASED_OUTPATIENT_CLINIC_OR_DEPARTMENT_OTHER): Payer: BLUE CROSS/BLUE SHIELD

## 2016-06-11 ENCOUNTER — Encounter: Payer: Self-pay | Admitting: Oncology

## 2016-06-11 VITALS — BP 127/65 | HR 84 | Temp 97.9°F | Resp 18 | Wt 130.0 lb

## 2016-06-11 DIAGNOSIS — C49A0 Gastrointestinal stromal tumor, unspecified site: Secondary | ICD-10-CM

## 2016-06-11 DIAGNOSIS — Z9081 Acquired absence of spleen: Secondary | ICD-10-CM

## 2016-06-11 DIAGNOSIS — E538 Deficiency of other specified B group vitamins: Secondary | ICD-10-CM | POA: Diagnosis not present

## 2016-06-11 DIAGNOSIS — D508 Other iron deficiency anemias: Secondary | ICD-10-CM

## 2016-06-11 DIAGNOSIS — C49A2 Gastrointestinal stromal tumor of stomach: Secondary | ICD-10-CM | POA: Diagnosis not present

## 2016-06-11 DIAGNOSIS — C641 Malignant neoplasm of right kidney, except renal pelvis: Secondary | ICD-10-CM

## 2016-06-11 DIAGNOSIS — C787 Secondary malignant neoplasm of liver and intrahepatic bile duct: Secondary | ICD-10-CM

## 2016-06-11 DIAGNOSIS — C50422 Malignant neoplasm of upper-outer quadrant of left male breast: Secondary | ICD-10-CM

## 2016-06-11 DIAGNOSIS — C49A9 Gastrointestinal stromal tumor of other sites: Secondary | ICD-10-CM | POA: Diagnosis not present

## 2016-06-11 DIAGNOSIS — Z79899 Other long term (current) drug therapy: Secondary | ICD-10-CM

## 2016-06-11 LAB — COMPREHENSIVE METABOLIC PANEL
ALK PHOS: 27 U/L — AB (ref 40–150)
ALT: 11 U/L (ref 0–55)
AST: 22 U/L (ref 5–34)
Albumin: 3.3 g/dL — ABNORMAL LOW (ref 3.5–5.0)
Anion Gap: 8 mEq/L (ref 3–11)
BUN: 11.8 mg/dL (ref 7.0–26.0)
CALCIUM: 8.5 mg/dL (ref 8.4–10.4)
CHLORIDE: 111 meq/L — AB (ref 98–109)
CO2: 24 mEq/L (ref 22–29)
Creatinine: 1 mg/dL (ref 0.7–1.3)
EGFR: 75 mL/min/{1.73_m2} — ABNORMAL LOW (ref >90)
Glucose: 135 mg/dl (ref 70–140)
POTASSIUM: 3.4 meq/L — AB (ref 3.5–5.1)
SODIUM: 143 meq/L (ref 136–145)
Total Bilirubin: 1.23 mg/dL — ABNORMAL HIGH (ref 0.20–1.20)
Total Protein: 6.2 g/dL — ABNORMAL LOW (ref 6.4–8.3)

## 2016-06-11 LAB — CBC WITH DIFFERENTIAL/PLATELET
BASO%: 1.1 % (ref 0.0–2.0)
BASOS ABS: 0.1 10*3/uL (ref 0.0–0.1)
EOS%: 4 % (ref 0.0–7.0)
Eosinophils Absolute: 0.2 10*3/uL (ref 0.0–0.5)
HEMATOCRIT: 36.8 % — AB (ref 38.4–49.9)
HGB: 11.9 g/dL — ABNORMAL LOW (ref 13.0–17.1)
LYMPH#: 1.7 10*3/uL (ref 0.9–3.3)
LYMPH%: 36.1 % (ref 14.0–49.0)
MCH: 29.4 pg (ref 27.2–33.4)
MCHC: 32.5 g/dL (ref 32.0–36.0)
MCV: 90.4 fL (ref 79.3–98.0)
MONO#: 0.6 10*3/uL (ref 0.1–0.9)
MONO%: 12.3 % (ref 0.0–14.0)
NEUT#: 2.2 10*3/uL (ref 1.5–6.5)
NEUT%: 46.5 % (ref 39.0–75.0)
Platelets: 212 10*3/uL (ref 140–400)
RBC: 4.07 10*6/uL — AB (ref 4.20–5.82)
RDW: 15.6 % — ABNORMAL HIGH (ref 11.0–14.6)
WBC: 4.7 10*3/uL (ref 4.0–10.3)

## 2016-06-11 NOTE — Progress Notes (Signed)
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 He also had T1a renal cell carcinoma treated with right partial nephrectomy 2008. Last imaging was MRI liver at [facility] 01-10-16, with next MRI liver at [facility] planned 06-2016. He continues gleevec ~ 300 mg once daily, this dose due to prior marked elevations in LFTs with full dosing; because of insurance concerns, he tells me that he has recently been taking the gleevec at 200 mg once daily in order to stretch out his supply.He has done a great deal of research into how to obtain the gleevec, with some options in progress. He denies abdominal pain, appetite is excellent (small amounts ongoing as essentially no stomach remaining from surgeries), no nausea.  He had bilateral tomo mammograms at [facility] 12-20-15 and [place] left axillary lymph node biopsy 01-17-16. He has continued to take tamoxifen 20m bid, tho [facility] as 20 mg 12 hours away from gleevec. [facility] that [facility] timing [facility], and his schedule sometimes does not allow this. He has not noticed any changes in either breast or axilla. He has not had problems with cellulitis or swelling LUE where he has ongoing problems with hand from a piece of wood that impaled into thenar area.  Last infed was 07-2014. He had severe skin reaction to Feraheme, but tolerated Infed without difficulty. He does not absorb oral iron, gradually loses with blood draws etc. Last iron studies 12-12-15 had serum  iron 44 and %sat 12, however hemoglobin today is better at 11.9 so will not give IV iron today. He has not had any overt bleeding. His energy is good, no symptoms of anemia.  B12 injections now are done by PCP monthly, need to continue indefinitely.  Renal cancer: no flank pain, no hematuria.   Otherwise, he has seen hand surgeon for the traumatic injury to left hand 11-2015, still open area not painful, no drainage, minimal swelling. He reports culture negative recently, silver nitrite tried after unable to remove any foreign body again at open procedure.  He hurt right hand last pm hitting a chair, swelling there improving and able to move fingers. Energy is excellent, no other new or different pain, no respiratory symptoms. No fever or infectious illness. SUccessful cataract surgery on right 05-16-16 Remainder of 10 point Review of Systems negative  BRCA negative Infed (inferon) 12-10-11 and 07-26-2014.  (intolerant to feraheme with marked skin reaction)  ONCOLOGIC HISTORY LEFT BREAST CANCER Patient had been aware of palpable mass adjacent to left areola for possibly 2-3 months, which had not changed that he could tell during that time. [facility], then excisional biopsy by Dr CBrantley Stageon 09-23-13. 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 He had re-excision with evaluation of sentinel nodes on 10-14-13 ([id number]), with no residual malignancy in the specimen and 6 sentinel  nodes negative. MDBC consensus was that extent of surgery was felt to be adequate and radiation oncology did not recommend additional radiation therapy; genetics testing was negative 01-2014 and bilateral mastectomies not recommended. He began tamoxifen 10-20-14, taking doses away from gleevec in attempt to  minimize possible decrease in active endoxifen metabolite of tamoxifen.  GIST was initially disgnosed 2007, with subtotal gastrectomy with Roux-en-Y, then briefly on study with [place] which he did not tolerate due to marked elevations in LFTs. He had recurrent GIST in late 2012 with further surgery by Dr.Levine March 2011, then rapid elevation of LFTs with 50% dosing (200 mg/d) gleevec, with gleevec again discontinued after short period in 2011. He had further progression in June 2012, did tolerate gleevec at 171m/d with initial prednisone, with adequate shrinkage of tumor to allow surgery by Dr. LClovis RileyDec 6, 2012. At laparotomy he had extensive adhesions but no gross peritoneal, hepatic or bowel involvement. The mass itself was cystic and soft and not densely adherent to any visceral structures. Pathology([facility] [phone] recurrent GIST 8.9 cm with resection margins not involved and comment that microscopically this exhibits an epithelial and glandular growth pattern which differs from previous specimens; nuclei medium-sized and bland; immunohistochemical panel postive for c-kit. No mitotic index is reported. He saw Dr.Levine last in Dec 2012. [facility], and PET Jan 2011. He subsequently did not have scans until 12-2014 due to insurance constraints. CT CAP 12-30-14 had new 2.4 x 2 cm lesion in right hepatic lobe, stable 3 mm and 2 mm right middle lobe lung nodules and no axillary or mediastinal adenopathy. The liver lesion was confirmed GIST by UKoreabiopsy 01-21-15. Gleevec was increased to 100 mg tid on ~ 01-27-15. [facility] 1.8 cm lesion. [facility], 5000cGy in 5 fractions completed 03-18-15.   RENAL CANCER history of T1a renal cell carcinoma, treated with partial right nephrectomy in 2008 and not active since then   Objective:  Vital signs in last 24  hours:  BP 127/65 (BP Location: Left Arm, Patient Position: Sitting)   Pulse 84   Temp 97.9 F (36.6 C) (Oral)   Resp 18   Wt 130 lb (59 kg)   SpO2 99%   BMI 17.63 kg/m  Weight stable from 11-2015  Alert, oriented and appropriate, talkative. Easily ambulatory  HEENT:PERRL, sclerae not icteric. Oral mucosa moist without lesions, posterior pharynx clear.  Neck supple. No JVD.  Lymphatics:no cervical,supraclavicular, right axillary adenopathy. Left axilla has same mobile 1x 1 cm nodes medially, not hard and not tender. Resp: clear to auscultation bilaterally and normal percussion bilaterally Cardio: regular rate and rhythm. No gallop. GI: soft, nontender, not distended, no clear mass or organomegaly. Normally active bowel sounds. Surgical incisions not remarkable. Musculoskeletal/ Extremities: UE/ LE without pitting edema, cords, tenderness. Left thenar eminence with 1.5 cm open area dressed, base clean, no surrounding erythema. Right hand with swelling mostly laterally, no bruising, moves fingers. Neuro: no peripheral neuropathy. Otherwise nonfocal Skin without rash, ecchymosis, petechiae Breasts: left lumpectomy site not remarkable, bilaterally without dominant mass, skin or nipple findings.   Lab Results:  Results for orders placed or performed in visit on 06/11/16  CBC with Differential  Result Value Ref Range   WBC 4.7 4.0 - 10.3 10e3/uL   NEUT# 2.2 1.5 - 6.5 10e3/uL   HGB 11.9 (L) 13.0 - 17.1 g/dL  HCT 36.8 (L) 38.4 - 49.9 %   Platelets 212 140 - 400 10e3/uL   MCV 90.4 79.3 - 98.0 fL   MCH 29.4 27.2 - 33.4 pg   MCHC 32.5 32.0 - 36.0 g/dL   RBC 4.07 (L) 4.20 - 5.82 10e6/uL   RDW 15.6 (H) 11.0 - 14.6 %   lymph# 1.7 0.9 - 3.3 10e3/uL   MONO# 0.6 0.1 - 0.9 10e3/uL   Eosinophils Absolute 0.2 0.0 - 0.5 10e3/uL   Basophils Absolute 0.1 0.0 - 0.1 10e3/uL   NEUT% 46.5 39.0 - 75.0 %   LYMPH% 36.1 14.0 - 49.0 %   MONO% 12.3 0.0 - 14.0 %   EOS% 4.0 0.0 - 7.0 %   BASO% 1.1 0.0  - 2.0 %  Comprehensive metabolic panel  Result Value Ref Range   Sodium 143 136 - 145 mEq/L   Potassium 3.4 (L) 3.5 - 5.1 mEq/L   Chloride 111 (H) 98 - 109 mEq/L   CO2 24 22 - 29 mEq/L   Glucose 135 70 - 140 mg/dl   BUN 11.8 7.0 - 26.0 mg/dL   Creatinine 1.0 0.7 - 1.3 mg/dL   Total Bilirubin 1.23 (H) 0.20 - 1.20 mg/dL   Alkaline Phosphatase 27 (L) 40 - 150 U/L   AST 22 5 - 34 U/L   ALT 11 0 - 55 U/L   Total Protein 6.2 (L) 6.4 - 8.3 g/dL   Albumin 3.3 (L) 3.5 - 5.0 g/dL   Calcium 8.5 8.4 - 10.4 mg/dL   Anion Gap 8 3 - 11 mEq/L   EGFR 75 (L) >90 ml/min/1.73 m2     Studies/Results:  MR LIVER WWO CONTRAST6/20/2017 [facility] Result Impression    1.Similar appearance of the right posterior hepatic lobe cystic lesion and surrounding parenchymal signal changes, compatible with posttreatment change. No new suspicious liver lesions are appreciated. Continued attention on follow-up. 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  Result Narrative  MR LIVER WITH AND WITHOUT CONTRAST, 04/10/2016 3:05 PM  INDICATION:C49.A0 Malignant gastrointestinal stromal tumor, unspecified site  ADDITIONAL HISTORY: GIST tumor and an RCC of the right kidney. Status post S/P RT the liver: 5000 cGy out of planned 5000 cGy delivered at 1000 cGy per fraction in 5 fractions completed 03/18/2015. Also treated with Gleevec. COMPARISON: MR abdomen 01/10/2016.  TECHNIQUE: Multiplanar, multisequence MR images of the upper abdomen were obtained before and after intravenous administration of gadolinium-based contrast.   FINDINGS:  LOWER CHEST Heart: Normal size. No pericardial effusion. Lungs/pleura: No masses, consolidations, or effusions.  ABDOMEN Liver: Similar appearance of cystic lesion in the posterior right hepatic lobe, which is not significantly changed in size  when measured in a similar fashion. It measures approximately 2.6 x 2.2 cm compared to 2.9 x 2.3 cm previously. No significant change in heterogeneous enhancement in the surrounding parenchyma, which is likely due to perfusional posttreatment changes. Stable punctate T2 hyperintense/T1 hypointense structure posterior to the ablation zone in the posterior right hepatic lobe. Unchanged small hepatic dome lipoma/pseudolipoma at segment 8.  Gallbladder: Cholecystectomy. Biliary: No obstruction. Spleen: Splenectomy. Pancreas: Similar appearance of a few scattered 1 to 2 mm T2 hyperintensities in the pancreatic tail, which are favored represent side branch IPMNs versus postinflammatory dilation of side branch pancreatic ducts. The main pancreatic duct is normal in appearance. Adrenals: Normal. Kidneys: Similar right renal ptosis and status post partial nephrectomy. No evidence of  recurrent enhancing mass. Similar appearance of a few tiny bilateral renal cysts. No hydronephrosis. Stomach/bowel: Unremarkable      He will be due mammograms at [place] ~ 12-20-16  Medications: I have reviewed the patient's current medications. Gleevec prescribed as 300 mg once daily Tamoxifen suggested 20 mg once daily 12 hrs from gleevec (see above) B12 injections monthly by PCP - thank you! IV iron as infed if iron deficiency anemia warrants  DISCUSSION All of interval history reviewed. Complicated oncologic history, but continues to do remarkable well.  Recommendations and follow up as noted, several physicians involved. I will ask financial staff if any other options to obtain gleevec that he has not investigated.  ~ q 6 month follow up for breast cancer appropriate, may be able to coordinate wth mammograms as he comes from a distance.   Assessment/Plan:  1.Progression of GIST with single 2.4 x 2 cm right hepatic lesion found on CT 12-2013 and confirmed by biopsy, not clear if progressed when off gleevec for  several months prior year or progressed on gleevec. Post stereotactic radiation at [facility] 02-2015 and on increased gleevec at least mostly since then, with MRI liver 01-10-16 still  no apparent progression. Continue Gleevec, extremely expensive even with assistance programs for name brand and for generic.  For MRI liver and to see Dr Kendall Flack again 06-2016. (GIST: initially diagnosed 2007 with subtotal gastrectomy with Roux-en-Y, then briefly on study with [place] which he did not tolerate due to marked elevations in LFTs. Recurrent GIST with surgery by Dr Clovis Riley March 2011, then rapid elevation of LFTs with 50% dose reduction of Gleevec. Further progression June 2012, tolerated gleevec at 100 mg daily with initial steroids, further surgery Dec 2012. GIST liver met ablated with [facility] 02-2015). 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 Nodularity in left anterior axillaappeared scarring + non-enlarged lymph nodes. Biopsy of lymph node 12-2015 benign, thought related to the hand problem. Consider spacing tamoxifen doses out from [place] due to potential decrease in active tamoxifen metabolite. Due mammograms 12-20-16 3..T1a renal cell carcinoma treated with right partial nephrectomy 2008, not recurrent. No blood in UA 11-2015. 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 Hemoglobin in good range today  6..long past tobacco. Stable tiny lung nodules, no other pulmonary findings of concern on last chest imaging 7..left hand impaled on gardening stake 11-2015. Copy at [facility] (?) involved. Still not resolved tho improved. 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 Insurance more stable now but coverage for gleevec/ imatinib very difficult 11.reported hx manic depressive illness, tho I have never been clear about that diagnosis. Prozac contraindicated with tamoxifen 12..low weight: limited po intake since nearly total gastrectomy, but stable 13.flu vaccine needed this fall 14.post splenectomy with GIST surgery   All questions answered. Mammograms ordered. Message to financial advocate re gleevec. Time spent 20 min including >50% counseling and coordination of care. CC Dr Darla Lesches, Dr  Kendall Flack, Dr Morene Rankins   Evlyn Clines, MD   06/11/2016, 1:59 PM

## 2016-06-12 ENCOUNTER — Telehealth: Payer: Self-pay

## 2016-06-12 NOTE — Telephone Encounter (Signed)
Mailed [name] a copy of his labs from 06-11-16 to address listed in EMR as requested below by Dr. Marti Sleigh.

## 2016-06-12 NOTE — Telephone Encounter (Signed)
-----   Message from Gordy Levan, MD sent at 06/12/2016 11:21 AM EDT ----- Labs seen and need follow up : please mail him a copy of CBC CMET thanks

## 2016-06-16 DIAGNOSIS — C49A2 Gastrointestinal stromal tumor of stomach: Secondary | ICD-10-CM | POA: Insufficient documentation

## 2016-06-19 ENCOUNTER — Telehealth: Payer: Self-pay | Admitting: Oncology

## 2016-06-19 NOTE — Telephone Encounter (Signed)
Faxed office note to Dr. Jaclyn Prime [phone] Changed pt's pcp to Dr. Jaclyn Prime

## 2016-08-10 ENCOUNTER — Telehealth: Payer: Self-pay | Admitting: *Deleted

## 2016-08-10 NOTE — Telephone Encounter (Signed)
Call from [facility] with [facility] reporting they have been unable to reach pt. The insurance they have on file is no longer current.  They are closing pt's account. Pt will need to call them for Gleevec refill. Message forwarded to collaborative RN.

## 2016-09-14 ENCOUNTER — Other Ambulatory Visit: Payer: Self-pay | Admitting: Oncology

## 2016-09-17 ENCOUNTER — Other Ambulatory Visit: Payer: Self-pay

## 2016-09-17 DIAGNOSIS — C50422 Malignant neoplasm of upper-outer quadrant of left male breast: Secondary | ICD-10-CM

## 2016-09-17 MED ORDER — TAMOXIFEN CITRATE 10 MG PO TABS: 20.0000 mg | ORAL_TABLET | Freq: Every day | ORAL | 3 refills | Status: DC

## 2016-09-21 ENCOUNTER — Encounter: Payer: Self-pay | Admitting: Oncology

## 2016-09-28 ENCOUNTER — Encounter: Payer: Self-pay | Admitting: Oncology

## 2016-09-28 NOTE — Progress Notes (Signed)
Medical Oncology  Message from patient that he is having monthly labs by Dr Almon Hercules in [place]  and will have next MRI at [facility] 03-22-16. Mammograms due ~ 12-19-16.  Message to [facility] HIM to get labs from PCP. Message to schedulers for [facility] 12-22 or in Jan, asked that schedulers try to speak with him to be sure appointment correct.  Godfrey Pick, MD

## 2016-10-10 ENCOUNTER — Other Ambulatory Visit: Payer: Self-pay | Admitting: Oncology

## 2016-10-12 ENCOUNTER — Ambulatory Visit (HOSPITAL_BASED_OUTPATIENT_CLINIC_OR_DEPARTMENT_OTHER): Payer: Medicare Other | Admitting: Oncology

## 2016-10-12 VITALS — BP 149/68 | HR 94 | Temp 98.0°F | Resp 17 | Ht 72.0 in | Wt 130.5 lb

## 2016-10-12 DIAGNOSIS — C641 Malignant neoplasm of right kidney, except renal pelvis: Secondary | ICD-10-CM

## 2016-10-12 DIAGNOSIS — C50422 Malignant neoplasm of upper-outer quadrant of left male breast: Secondary | ICD-10-CM

## 2016-10-12 DIAGNOSIS — C49A2 Gastrointestinal stromal tumor of stomach: Secondary | ICD-10-CM | POA: Diagnosis present

## 2016-10-12 DIAGNOSIS — Z170 Estrogen receptor positive status [ER+]: Secondary | ICD-10-CM

## 2016-10-12 DIAGNOSIS — Z9081 Acquired absence of spleen: Secondary | ICD-10-CM | POA: Diagnosis not present

## 2016-10-12 DIAGNOSIS — E538 Deficiency of other specified B group vitamins: Secondary | ICD-10-CM | POA: Diagnosis not present

## 2016-10-12 DIAGNOSIS — Z79899 Other long term (current) drug therapy: Secondary | ICD-10-CM

## 2016-10-12 NOTE — Progress Notes (Signed)
OFFICE PROGRESS NOTE   October 12, 2016   Physicians: Henry [name] (PCP [place], [phone]), Henry [name], Henry [name], Henry [name], Henry [name], Henry [name], Henry [name] (radiation oncology [facility] PCP Henry [name] [place] [phone]  INTERVAL HISTORY:  Patient is seen, alone for visit, in follow up of recurrent GIST continuing gleevec, and [name] 1 left breast cancer on tamoxifen. He is on monthly B12 injections and has needed IV iron in past, both related to extensive GI resections for GIST. He has history of renal cell carcinoma T1a treated with partial right nephrectomy 2008.   Patient feels that he is doing well from oncologic standpoint. Interval history as follows:  GIST:  Last MRI liver was at [facility] 09-11-16, stable area right hepatic lobe since [facility] 02-2015. He is taking gleevec 300 mg daily (dose due to previous problems with LFTs). He has not qualified for gleevec assistance in past, and at times has had difficulty getting the medication, tho is ok with that now. I will ask financial staff if any new options for gleevec as he is now on medicare. Patient has looked into gleevec assistance extensively himself. He denies abdominal discomfort, eats small amounts very regularly, has kept weight stable. Bowels ok.  He asks if imaging and follow up for the GIST can be done now in [place].   Left breast cancer: ingrown hair adjacent to nipple, no other changes that he notices, nothing in axilla and no swelling LUE. No changes or discomfort right breast. Note foreign body in left thenar area x months, but no cellulitis or swelling LUE even with this. He has been on tamoxifen since 10-20-14,  which he tries to take away from gleevec. BRCA negative. Last bilateral [facility]mo mammograms at [facility] 12-20-15, due again early March 2018  B12 deficiency: now receiving the monthly B12 injections by PCP Henry [name] in [place], which is much more convenient for him. That  office does monthly labs, tho last CBC and CMET received prior to this visit were from 07-20-16; will request more recent labs if available (labs not done here today)  Right renal cell carcinoma post partial right nephrectomy 2008. No gross hematuria, no pain. He has unchanged nocturia ~ 4x at night, but also drinks liquids at night. He is known to Henry [name], but probably has not seen him recently. Kidneys without concern on most recent MRI abdomen, did not image pelvis.   Iron deficiency:  Unable to absorb oral iron due to GI resections for GIST. Last Infed (inferon) 12-10-11 and 07-26-2014. (intolerant to feraheme with marked skin reaction). Hgb from outside lab 07-20-16 11.7 with MCV 91.   ADDITIONAL ONCOLOGIC HISTORY LEFT BREAST CANCER Patient had been aware of palpable mass adjacent to left areola for possibly 2-3 months, which had not changed that he could tell during that time. He had bilateral mammograms and left US done at [facility] on 08-28-13, then excisional biopsy by Henry [name] on 09-23-13. 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 He had re-excision with evaluation of sentinel nodes on 10-14-13 ([id number]), with no residual malignancy in the specimen and 6 sentinel nodes negative. Henry [name] consensus was that extent of surgery was felt to be adequate and radiation oncology did not recommend additional radiation therapy; genetics testing was negative 01-2014 and bilateral mastectomies not recommended. He began  tamoxifen 10-20-14, taking doses away from gleevec in attempt to minimize possible decrease in active endoxifen metabolite of tamoxifen. Biopsy of palpable left axillary node 01-16-16 benign.    GIST initially disgnosed 2007, with subtotal gastrectomy with Roux-en-Y, then briefly on study with [place] which he did not tolerate  due to marked elevations in LFTs. He had recurrent GIST in late 2012 with further surgery by Henry [name] March 2011, then rapid elevation of LFTs with 50% dosing (200 mg/d) gleevec, with gleevec again discontinued after short period in 2011. He had further progression in June 2012, did tolerate gleevec at 129m/d with initial prednisone, with adequate shrinkage of tumor to allow surgery by Henry. LClovis RileyDec 6, 2012. At laparotomy he had extensive adhesions but no gross peritoneal, hepatic or bowel involvement. The mass itself was cystic and soft and not densely adherent to any visceral structures. Pathology([facility] [phone] recurrent GIST 8.9 cm with resection margins not involved and comment that microscopically this exhibits an epithelial and glandular growth pattern which differs from previous specimens; nuclei medium-sized and bland; immunohistochemical panel postive for c-kit. No mitotic index is reported. He saw Henry [name] last in Dec 2012. [facility], and PET Jan 2011. He subsequently did not have scans until 12-2014 due to insurance constraints. CT CAP 12-30-14 had new 2.4 x 2 cm lesion in right hepatic lobe, stable 3 mm and 2 mm right middle lobe lung nodules and no axillary or mediastinal adenopathy. The liver lesion was confirmed GIST by [facility] 01-21[facility]15. Gleevec was increased to 100 mg tid on ~ 01-27-15. [facility] showed 1.8 cm lesion. [facility], 5000cGy in 5 fractions completed 03-18-15.   RENAL CANCER history of T1a renal cell carcinoma, treated with partial right nephrectomy in 2008 (Henry LHessie Dienerand Henry [name])   Review of Systems otherwise: Foreign body in left thenar area since gardening accident 11-2015 with imbedded fragment of wood and wound has not healed despite several interventions. [facility].  2 episodes of near syncope when he [place], no vertigo with this, no other neurologic symptoms. No recent infectious illness. No respiratory problems. No bleeding, no UE or LE swelling. Appetite and energy good.   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 Widowed, wife died with lung cancer, was EVanuatuprofessor. No close family, no children, step children have not been involved. [place]  Objective:  Vital signs in last 24 hours:  BP (!) 149/68 (BP Location: Left Arm, Patient Position: Sitting)   Pulse 94   Temp 98 F (36.7 C) (Oral)   Resp 17   Ht 6' (1.829 m)   Wt 130 lb 8 oz (59.2 kg)   SpO2 100%   BMI 17.70 kg/m  Weight stable, very thin but not cachectic. Alert, oriented and appropriate, looks comfortable, very talkative and delightful as always. Ambulatory quickly and easily. Respirations not labored.   HEENT:PERRL, sclerae not icteric. Oral mucosa moist without lesions, posterior pharynx clear.  Neck supple. No JVD.  Lymphatics:no cervical,supraclavicular, right axillary adenopathy. Mobile lower medial left axillary node seems softer today (benign by biopsy 12-2015) Resp: clear to auscultation bilaterally and normal percussion bilaterally Cardio: regular rate and  rhythm. No gallop. GI: soft, nontender, not distended, no mass or organomegaly. Normally active bowel sounds. Surgical incision not remarkable. Musculoskeletal/ Extremities: UE / LE without pitting edema, cords, tenderness. Open wound left thenar area ~ 1 cm diameter, no drainage, no streaking, swelling or erythema. Neuro: nonfocal PSYCH appropriate mood and affect Skin without rash, ecchymosis, petechiae Breasts: right unremarkable. Left with well healed surgical scar at lateral areola, no mass or other findings of concern. Superficial partially ingrown hair at  scar. Axillae as noted above  Lab Results from [facility] in [place] 08-17-16 B12 363  (no other labs that date) 07-20-16:  WBC 5.5, Hgb 11.7, plt 247, MCV 91, ANC 2.5 07-20-16:  Na 143, K 4.6, Cl 104, glu 115, BUN 9, creat 0.99, EGFR 90, Tprot 6.0, alb 3.5, Tbili 1.0, AP 23, AST 19, ALT 10   Studies/Results: MRI liver [facility] 09-11-16 Result Impression   1.Stable right posterior hepatic lobe lesion with surrounding parenchymal signal abnormalities. No new hepatic lesion identified. 2.Other unchanged findings as described above.  Result Narrative  Henry LIVER WITH AND WITHOUT CONTRAST, 09/11/2016 6:54 PM  INDICATION: restaging \ C49.A0 Malignant gastrointestinal stromal tumor, unspecified site [facility])   ADDITIONAL HISTORY: None.  COMPARISON: Multiple prior, most recent Henry abdomen dated 04/10/2016.  TECHNIQUE: Multiplanar, multisequence Henry images of the upper abdomen were obtained before and after intravenous administration of gadolinium-based contrast.  FINDINGS:  LOWER CHEST Heart: Normal size. No pericardial effusion. Lungs/pleura: No masses, consolidations, or effusions.  ABDOMEN Liver: No interval change in size of cystic lesion in posterior right hepatic lobe measuring 2.6 x 2 cm(series 13, image 52) with similar peripheral heterogeneous parenchymal enhancement and punctate T2 hyperintensities in the ablation zone. Unchanged hepatic dome lipoma/pseudolipoma in segment 8. Gallbladder: Cholecystectomy. Biliary: Persistent extrahepatic biliary dilatation, unchanged from prior. Similar mild dilatation of intrahepatic biliary ducts in left lobe. Spleen: Splenectomy. Pancreas: Similar subcentimeter scattered hyperintensities in pancreatic head and tail favoring side branch IPMN. Pancreatic duct appears nondilated. Adrenals: Normal. Kidneys: Status right partial nephrectomy. Similar bilateral subcentimeter hyperintensities which are too small to characterize but  statistically benign. No evidence of local recurrence. No evidence of hydronephrosis. Stomach/bowel: Status post partial gastrectomy. No evidence of bowel obstruction.     Medications: I have reviewed the patient's current medications. Continue tamoxifen, continue gleevec. Cannot absorb oral B12 or oral iron. Severe skin reaction to feraheme, tolerates infed.  DISCUSSION Interval history reviewed. Clinically doing well from standpoint of complicated oncologic history.  He prefers to be followed for oncology in [place] rather than with Henry Kendall Flack (and Henry Clovis [name]) at [facility] now, including scans.  Plan per those physicians was for repeat MRI abdomen in 6 months. He understands that I will not be working after Jan, however Henry Alen Blew has kindly agreed to take over his care for oncology. I have spoken with Henry Alen Blew now, and first appointment with him will be set up probably in next 2 months.  Henry [name] will need mammograms ~ 12-20-16.  Will request more recent labs if available from PCP, otherwise likely needs labs here with next visit.   Henry [name] will discuss the positional lightheaded sensation with Henry [name], may want carotid dopplers or other. He will avoid the triggering head position.   Assessment/Plan: 1.Progression of GIST with single 2.4 x 2 cm right hepatic lesion found on CT 12-2013 and confirmed by biopsy, not clear if progressed when off gleevec intermittently due to difficulty getting med, or progressed on that previous dose of  gleevec. Post stereotactic radiation at [facility] 02-2015 and on increased gleevec subsequently, which he is tolerating, with MRI liver 08-2016 stable.  Continue Gleevec at 300 mg daily.   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 Due tomo mammograms at [place] 12-20-16  Nodularity in left anterior axillaappeared scarring + non-enlarged lymph nodes, biopsy of lymph node  12-2015 benign, thought related to the hand problem.  3..T1a renal cell carcinoma treated with right partial nephrectomy 2008, not recurrent. No blood in UA 11-2015. 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 Not symptomatic now, not sure if more recent outside labs, may need repeat with next visit here 6..long past tobacco. Stable tiny lung nodules, no other pulmonary findings of concern on last chest imaging 7..left hand impaled on gardening stake 11-2015 with foreign body still embedded.  Hand surgeon to do procedure 11-01-16 8..cataracts:may have increased  more quickly on tamoxifen, post right cataract extraction 04-2016  9.Marland Kitchenatherosclerotic changes in aorta by imaging 10.difficult social situation: now lives ~ an hour from [place]. Insurance more stable now but coverage for gleevec/ imatinib very difficult 11.reported hx manic depressive illness, tho I have never been clear about that diagnosis. Prozac contraindicated with tamoxifen 12..low weight but stable, related to gastrectomy for GIST. Eats very regularly.  13.flu vaccine 08-17-16 14.post splenectomy with GIST surgery 15. Positional light headedness or near syncope, when neck flexed far back to look at top bookshelf. Will discuss with Henry [name], avoid that position.   All questions answered and he knows to call prior to next appointment if needed. This office to be back in touch with him re appointment with Henry Alen Blew - thank you. Message to collaborative RN for outside labs. Time spent 20 min including >50% counseling and coordination of care. Cc Henry [name]     Evlyn Clines, MD   10/12/2016, 3:19 PM

## 2016-10-14 ENCOUNTER — Encounter: Payer: Self-pay | Admitting: Oncology

## 2016-10-18 ENCOUNTER — Encounter: Payer: Self-pay | Admitting: Oncology

## 2016-10-18 ENCOUNTER — Other Ambulatory Visit: Payer: Self-pay | Admitting: Oncology

## 2016-10-18 NOTE — Progress Notes (Signed)
Medical Oncology  Labs received from Dr Jaclyn Prime, reportedly done for MRI 09-12-16 (no date on labs). As follows, and will be scanned into this EMR:  WBC 6.0, ANC 2.6, Hgb 11.2, plt 213k, MCV 90.4 Na 142, K 3.3, BUN 12, creat 0.99, glu 99, Ca 8.2, T prot 5.6, alb 3.4, AP 19, AST 18, ALT 9, Tbili 0.8  L.Marko Plume, MD

## 2016-10-24 ENCOUNTER — Telehealth: Payer: Self-pay | Admitting: Oncology

## 2016-10-24 NOTE — Telephone Encounter (Signed)
Left message on voicemail about 11/27/16 appointment.  Also left our number if he had any questions to call us back

## 2016-10-29 ENCOUNTER — Telehealth: Payer: Self-pay | Admitting: Oncology

## 2016-10-29 NOTE — Telephone Encounter (Signed)
LEFT MESSAGE RE 2/6 FU. PATIENT ALSO [place].

## 2016-11-27 ENCOUNTER — Ambulatory Visit: Payer: No Typology Code available for payment source | Admitting: Oncology

## 2016-12-06 ENCOUNTER — Encounter: Payer: Self-pay | Admitting: Oncology

## 2016-12-07 ENCOUNTER — Telehealth: Payer: Self-pay | Admitting: Oncology

## 2016-12-07 NOTE — Telephone Encounter (Signed)
lvm to inform pt of 3/15 appt at 12 pm per LOS

## 2017-01-03 ENCOUNTER — Ambulatory Visit (HOSPITAL_BASED_OUTPATIENT_CLINIC_OR_DEPARTMENT_OTHER): Payer: Medicare Other | Admitting: Oncology

## 2017-01-03 ENCOUNTER — Telehealth: Payer: Self-pay | Admitting: Oncology

## 2017-01-03 ENCOUNTER — Other Ambulatory Visit: Payer: Self-pay | Admitting: *Deleted

## 2017-01-03 DIAGNOSIS — C49A2 Gastrointestinal stromal tumor of stomach: Secondary | ICD-10-CM

## 2017-01-03 DIAGNOSIS — D6489 Other specified anemias: Secondary | ICD-10-CM

## 2017-01-03 DIAGNOSIS — C50422 Malignant neoplasm of upper-outer quadrant of left male breast: Secondary | ICD-10-CM

## 2017-01-03 DIAGNOSIS — E538 Deficiency of other specified B group vitamins: Secondary | ICD-10-CM | POA: Diagnosis not present

## 2017-01-03 MED ORDER — IMATINIB MESYLATE 100 MG PO TABS: 200.0000 mg | ORAL_TABLET | Freq: Every day | ORAL | 6 refills | Status: DC

## 2017-01-03 NOTE — Progress Notes (Signed)
Hematology and Oncology Follow Up Visit  Henry [name] [id number] [birth year] 66 y.o. 01/03/2017 12:36 PM [name], [name], MDGennosa, [name], MD   Principle Diagnosis: 66 year old gentleman with the following diagnoses:  1. 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  2. 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 His tumor is ER PR positive and currently on tamoxifen since 10/20/2014.  3. Multifactorial anemia: Related to B12 deficiency as well as iron deficiency.  4. Renal cell carcinoma status post partial nephrectomy in 2008.  Prior Therapy: He is S/Psubtotal gastrectomy with Roux-en-Y, then briefly on study with [place] which he did not tolerate due to marked elevations in LFTs.   He had recurrent GIST in late 2012 with further surgery by Dr.Levine March 2011, then rapid elevation of LFTs with 50% dosing (200 mg/d) gleevec, with gleevec again discontinued after short period in 2011. He had further progression in June 2012, did tolerate gleevec at 17m/d with initial prednisone, with adequate shrinkage of tumor to allow surgery by Dr. LClovis RileyDec 6, 2012. At laparotomy he had extensive adhesions but no gross peritoneal, hepatic or bowel involvement. The mass itself was cystic and soft and not densely adherent to any visceral structures. Pathology([facility] [phone] recurrent GIST 8.9 cm with resection margins not involved,   CT CAP 12-30-14 had new 2.4 x 2 cm lesion in right hepatic lobe, stable 3 mm and 2 mm right middle lobe lung nodules and no axillary or mediastinal adenopathy. The liver lesion was confirmed GIST by [facility] 01-21-15. Gleevec was increased to 100 mg tid on ~ 01-27-15.   [facility]. [facility], 5000cGy in 5  fractions completed 03-18-15.   Current therapy:   Gleevec 200 mg daily   Tamoxifen 20 mg daily since 2015.  Vitamin B12 injection 1000 g every 30 days given at his primary care office.  Interim History: Henry [name] today for a follow-up visit. He is a pleasant gentleman with a rather complex history have been followed with Dr. LMarko Plumesince 2007. [facility]. His most recent MRI in November 2017 showed no changes in his liver lesion. Clinically has not reported any recent complaints. He denied any decline in his performance status or activity level. Do not any excessive fatigue or tiredness. He denied any abdominal pain or discomfort. He continues to drive and make his appointments despite living 2 and 1/2 hours away from this area.  He did have an operation on his center more of a foreign body and have received occupational therapy from that standpoint.  He does not report any headaches, blurry vision, syncope or seizures. He does not report any fevers, chills or sweats. He does not report any cough, wheezing or hemoptysis. He does not report any nausea, vomiting or abdominal pain. He does not report any frequency urgency or hesitancy. He is not report any skeletal complaints. Remaining review of systems unremarkable.  Medications: I have reviewed the patient's current medications.  Current Outpatient Prescriptions  Medication Sig Dispense Refill  . Cyanocobalamin (VITAMIN B-12 IJ) Inject 1,000 mcg as directed every 30 (thirty) days. Reported on 12/12/2015    . imatinib (GLEEVEC) 100 MG tablet Take 2 tablets (  200 mg total) by mouth daily. Take with meals and large glass of water.Caution:Chemotherapy 90 tablet 6  . tamoxifen (NOLVADEX) 10 MG tablet Take 2 tablets (20 mg total) by mouth daily. 60 tablet 3   No current facility-administered medications for this visit.      Allergies:  Allergies  Allergen  Reactions  . Feraheme [Ferumoxytol] Hives    Past Medical History, Surgical history, Social history, and Family History were reviewed and updated.  Physical Exam: Blood pressure 124/72, pulse 74, temperature 98.3 F (36.8 C), temperature source Oral, resp. rate 16, height 6' (1.829 m), weight 131 lb 9.6 oz (59.7 kg), SpO2 99 %. ECOG: 0 General appearance: alert and cooperative Head: Normocephalic, without obvious abnormality Neck: no adenopathy Lymph nodes: Cervical, supraclavicular, and axillary nodes normal. Heart:regular rate and rhythm, S1, S2 normal, no murmur, click, rub or gallop Lung:chest clear, no wheezing, rales, normal symmetric air entry Abdomin: soft, non-tender, without masses or organomegaly EXT:no erythema, induration, or nodules   Lab Results: Lab Results  Component Value Date   WBC 4.7 06/11/2016   HGB 11.9 (L) 06/11/2016   HCT 36.8 (L) 06/11/2016   MCV 90.4 06/11/2016   PLT 212 06/11/2016     Chemistry      Component Value Date/Time   NA 143 06/11/2016 0804   K 3.4 (L) 06/11/2016 0804   CL 105 04/13/2013 0922   CO2 24 06/11/2016 0804   BUN 11.8 06/11/2016 0804   CREATININE 1.0 06/11/2016 0804      Component Value Date/Time   CALCIUM 8.5 06/11/2016 0804   ALKPHOS 27 (L) 06/11/2016 0804   AST 22 06/11/2016 0804   ALT 11 06/11/2016 0804   BILITOT 1.23 (H) 06/11/2016 0804     MRI 08/2016  Radiological Studies: ABDOMEN Liver: No interval change in size of cystic lesion in posterior right hepatic lobe measuring 2.6 x 2 cm(series 13, image 52) with similar peripheral heterogeneous parenchymal enhancement and punctate T2 hyperintensities in the ablation zone. Unchanged hepatic dome lipoma/pseudolipoma in segment 8. Gallbladder: Cholecystectomy. Biliary: Persistent extrahepatic biliary dilatation, unchanged from prior. Similar mild dilatation of intrahepatic biliary ducts in left lobe. Spleen: Splenectomy. Pancreas: Similar subcentimeter scattered  hyperintensities in pancreatic head and tail favoring side branch IPMN. Pancreatic duct appears nondilated. Adrenals: Normal. Kidneys: Status right partial nephrectomy. Similar bilateral subcentimeter hyperintensities which are too small to characterize but statistically benign. No evidence of local recurrence. No evidence of hydronephrosis. Stomach/bowel: Status post partial gastrectomy. No evidence of bowel obstruction. Additional comments: None.  Impression and Plan:  66 year old gentleman with the following issues:  1.GIST: He is status post multiple surgeries and recurrences and currently with single 2.4 x 2 cm right hepatic lesion found on CT 12-2013 and confirmed by biopsy, not clear if progressed when off gleevec intermittently due to difficulty getting med, or progressed on that previous dose of gleevec.   He is S/P stereotactic radiation at [facility] 02-2015 and currently on Gleevec 200 mg daily.  The plan is to continue with the current dose and he'll have repeat MRI in May 2018 to be done at [facility]. He is having difficulties with his copayments for Gleevec at this time. We will refill his medication at the current dose and assist him in finding copayment assistant.  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 I have recommended continuing tamoxifen for at least 5 years total.   Nodularity in left  anterior axillaappeared scarring + non-enlarged lymph nodes, biopsy of lymph node 12-2015 benign, thought related to the hand problem.  3.T1a renal cell carcinoma treated with right partial nephrectomy 2008. No evidence of relapse noted since that time.  4. B12 deficiency due to gastrectomy, on monthly B12 now by PCP  5. 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 This will be repeated in the future as needed.  6. Left hand  impaled on gardening stake 11-2015 with foreign body still embedded.   he is status post hand surgery and have recovered reasonably well with occupational therapy.  7. Follow-up: Will be in 6 months to check on his clinical status. He will have his blood results done with his primary care physician faxed to me periodically.      Zola Button, MD 3/15/201812:36 PM

## 2017-01-03 NOTE — Telephone Encounter (Signed)
Gave patient AVS and calender per 01/03/2017 los 

## 2017-01-07 ENCOUNTER — Encounter: Payer: Self-pay | Admitting: Pharmacist

## 2017-01-07 MED FILL — IMATINIB MESYLATE 100 MG TA: 100 | 30 days supply | Qty: 60 | Fill #0

## 2017-01-07 NOTE — Progress Notes (Signed)
Oral Chemotherapy Pharmacist Encounter  A prescription for Gleevec has been sent to the [facility] outpatient pharmacy for benefit analysis and approval.   Pt is on maintenance Gleevec for GIST.  Will follow up with patient regarding insurance and pharmacy.  Thank you, Kennith Center, Pharm.D., CPP 01/07/2017@4 :32 PM Oral Chemotherapy Clinic

## 2017-01-08 NOTE — Progress Notes (Signed)
[place] copay is [phone]. needs pt asst. Will start process to find assistance.  Kennith Center, Pharm.D., CPP 01/08/2017@4 :[facility]

## 2017-01-10 NOTE — Progress Notes (Signed)
Oral Chemotherapy Pharmacist Encounter  Received notification from [facility] that [place] copay is now $[phone] once they changed some product information around. The pharmacy contacted patient who is able to afford this new copay. No copay assistance needed. They will contact patient when ready for pick-up.  Oral oncology Clinic will continue to follow.  Johny Drilling, PharmD, BCPS, BCOP 01/10/2017  4:21 PM Oral Oncology Clinic [phone]

## 2017-01-29 ENCOUNTER — Encounter: Payer: Self-pay | Admitting: *Deleted

## 2017-02-08 ENCOUNTER — Other Ambulatory Visit: Payer: Self-pay | Admitting: *Deleted

## 2017-02-08 DIAGNOSIS — C50422 Malignant neoplasm of upper-outer quadrant of left male breast: Secondary | ICD-10-CM

## 2017-02-08 MED ORDER — TAMOXIFEN CITRATE 10 MG PO TABS: 20.0000 mg | ORAL_TABLET | Freq: Every day | ORAL | 3 refills | Status: DC

## 2017-02-12 ENCOUNTER — Encounter: Payer: Self-pay | Admitting: *Deleted

## 2017-02-14 MED FILL — IMATINIB MESYLATE 100 MG TA: 100 | 30 days supply | Qty: 60 | Fill #1

## 2017-02-20 ENCOUNTER — Other Ambulatory Visit: Payer: Self-pay | Admitting: *Deleted

## 2017-02-20 DIAGNOSIS — C50422 Malignant neoplasm of upper-outer quadrant of left male breast: Secondary | ICD-10-CM

## 2017-02-20 MED ORDER — TAMOXIFEN CITRATE 10 MG PO TABS: 20.0000 mg | ORAL_TABLET | Freq: Every day | ORAL | 3 refills | Status: DC

## 2017-03-11 MED FILL — IMATINIB MESYLATE 100 MG TA: 100 | 30 days supply | Qty: 60 | Fill #2

## 2017-04-08 MED FILL — IMATINIB MESYLATE 100 MG TA: 100 | 30 days supply | Qty: 60 | Fill #3

## 2017-05-02 MED FILL — IMATINIB MESYLATE 100 MG TA: 100 | 30 days supply | Qty: 60 | Fill #4

## 2017-05-31 MED FILL — IMATINIB MESYLATE 100 MG TA: 100 | 30 days supply | Qty: 60 | Fill #5

## 2017-07-09 ENCOUNTER — Ambulatory Visit (HOSPITAL_BASED_OUTPATIENT_CLINIC_OR_DEPARTMENT_OTHER): Payer: Medicare Other | Admitting: Oncology

## 2017-07-09 ENCOUNTER — Ambulatory Visit (HOSPITAL_BASED_OUTPATIENT_CLINIC_OR_DEPARTMENT_OTHER): Payer: Medicare Other

## 2017-07-09 ENCOUNTER — Telehealth: Payer: Self-pay | Admitting: Oncology

## 2017-07-09 VITALS — BP 143/65 | HR 77 | Temp 98.1°F | Resp 18 | Ht 72.0 in | Wt 137.0 lb

## 2017-07-09 DIAGNOSIS — C649 Malignant neoplasm of unspecified kidney, except renal pelvis: Secondary | ICD-10-CM

## 2017-07-09 DIAGNOSIS — C50422 Malignant neoplasm of upper-outer quadrant of left male breast: Secondary | ICD-10-CM

## 2017-07-09 DIAGNOSIS — C49A0 Gastrointestinal stromal tumor, unspecified site: Secondary | ICD-10-CM

## 2017-07-09 DIAGNOSIS — E538 Deficiency of other specified B group vitamins: Secondary | ICD-10-CM

## 2017-07-09 DIAGNOSIS — C49A2 Gastrointestinal stromal tumor of stomach: Secondary | ICD-10-CM

## 2017-07-09 DIAGNOSIS — D649 Anemia, unspecified: Secondary | ICD-10-CM | POA: Diagnosis not present

## 2017-07-09 LAB — COMPREHENSIVE METABOLIC PANEL
ALT: 8 U/L (ref 0–55)
AST: 16 U/L (ref 5–34)
Albumin: 3.4 g/dL — ABNORMAL LOW (ref 3.5–5.0)
Alkaline Phosphatase: 25 U/L — ABNORMAL LOW (ref 40–150)
Anion Gap: 7 mEq/L (ref 3–11)
BUN: 13.4 mg/dL (ref 7.0–26.0)
CALCIUM: 8.7 mg/dL (ref 8.4–10.4)
CHLORIDE: 109 meq/L (ref 98–109)
CO2: 27 mEq/L (ref 22–29)
Creatinine: 1.1 mg/dL (ref 0.7–1.3)
EGFR: 74 mL/min/{1.73_m2} — ABNORMAL LOW (ref >90)
GLUCOSE: 123 mg/dL (ref 70–140)
POTASSIUM: 3.7 meq/L (ref 3.5–5.1)
SODIUM: 142 meq/L (ref 136–145)
Total Bilirubin: 1 mg/dL (ref 0.20–1.20)
Total Protein: 6.6 g/dL (ref 6.4–8.3)

## 2017-07-09 LAB — CBC WITH DIFFERENTIAL/PLATELET
BASO%: 1.4 % (ref 0.0–2.0)
BASOS ABS: 0.1 10*3/uL (ref 0.0–0.1)
EOS%: 2.1 % (ref 0.0–7.0)
Eosinophils Absolute: 0.1 10*3/uL (ref 0.0–0.5)
HCT: 29.9 % — ABNORMAL LOW (ref 38.4–49.9)
HGB: 9.6 g/dL — ABNORMAL LOW (ref 13.0–17.1)
LYMPH%: 41.6 % (ref 14.0–49.0)
MCH: 26.8 pg — AB (ref 27.2–33.4)
MCHC: 32.2 g/dL (ref 32.0–36.0)
MCV: 83.4 fL (ref 79.3–98.0)
MONO#: 0.6 10*3/uL (ref 0.1–0.9)
MONO%: 11.7 % (ref 0.0–14.0)
NEUT#: 2.3 10*3/uL (ref 1.5–6.5)
NEUT%: 43.2 % (ref 39.0–75.0)
Platelets: 272 10*3/uL (ref 140–400)
RBC: 3.59 10*6/uL — ABNORMAL LOW (ref 4.20–5.82)
RDW: 16.1 % — ABNORMAL HIGH (ref 11.0–14.6)
WBC: 5.3 10*3/uL (ref 4.0–10.3)
lymph#: 2.2 10*3/uL (ref 0.9–3.3)

## 2017-07-09 LAB — IRON AND TIBC
%SAT: 7 % — AB (ref 20–55)
Iron: 32 ug/dL — ABNORMAL LOW (ref 42–163)
TIBC: 485 ug/dL — AB (ref 202–409)
UIBC: 453 ug/dL — AB (ref 117–376)

## 2017-07-09 LAB — FERRITIN: FERRITIN: 9 ng/mL — AB (ref 22–316)

## 2017-07-09 MED FILL — IMATINIB MESYLATE 100 MG TA: 100 | 30 days supply | Qty: 60 | Fill #6

## 2017-07-09 NOTE — Progress Notes (Signed)
Hematology and Oncology Follow Up Visit  [name] [id number] 1951-01-02 66 y.o. 07/09/2017 1:11 PM [name], MDGennosa, [name], MD   Principle Diagnosis: 66 year old gentleman with the following diagnoses:  1. 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  2. 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 His tumor is ER PR positive and currently on tamoxifen since 10/20/2014.  3. Multifactorial anemia: Related to B12 deficiency as well as iron deficiency.  4. Renal cell carcinoma status post partial nephrectomy in 2008.  Prior Therapy: He is S/Psubtotal gastrectomy with Roux-en-Y, then briefly on study with [place] which he did not tolerate due to marked elevations in LFTs.   He had recurrent GIST in late 2012 with further surgery by Dr.Levine March 2011, then rapid elevation of LFTs with 50% dosing (200 mg/d) gleevec, with gleevec again discontinued after short period in 2011. He had further progression in June 2012, did tolerate gleevec at 151m/d with initial prednisone, with adequate shrinkage of tumor to allow surgery by Dr. LClovis RileyDec 6, 2012. At laparotomy he had extensive adhesions but no gross peritoneal, hepatic or bowel involvement. The mass itself was cystic and soft and not densely adherent to any visceral structures. Pathology([facility] [phone] recurrent GIST 8.9 cm with resection margins not involved,   CT CAP 12-30-14 had new 2.4 x 2 cm lesion in right hepatic lobe, stable 3 mm and 2 mm right middle lobe lung nodules and no axillary or mediastinal adenopathy. The liver lesion was confirmed GIST by [facility] 01-21[facility]. Gleevec was increased to 100 mg tid on ~ 01-27-15.   [facility] showed 1.8 cm lesion. [facility], 5000cGy in 5  fractions completed 03-18-15.   Current therapy:   Gleevec 200 mg daily   Tamoxifen 20 mg daily since 2015.  Vitamin B12 injection 1000 g every 30 days given at his primary care office.  Interim History: Mr. Henry Medianpresents today for a follow-up visit. Since the last visit, he reports no complaints. He denied any decline in his performance status or activity level. Do not any excessive fatigue or tiredness. He denied any abdominal pain or discomfort. He continues to drive and attends to activities of daily living. [facility] one was done in June 2018.   He does not report any headaches, blurry vision, syncope or seizures. He does not report any fevers, chills or sweats. He does not report any cough, wheezing or hemoptysis. He does not report any nausea, vomiting or abdominal pain. He does not report any frequency urgency or hesitancy. He is not report any skeletal complaints. Remaining review of systems unremarkable.  Medications: I have reviewed the patient's current medications.  Current Outpatient Prescriptions  Medication Sig Dispense Refill  . Cyanocobalamin (VITAMIN B-12 IJ) Inject 1,000 mcg as directed every 30 (thirty) days. Reported on 12/12/2015    . imatinib (GLEEVEC) 100 MG tablet Take 2 tablets (200 mg total) by mouth daily. Take with meals and large glass of water.Caution:Chemotherapy 90 tablet 6  . tamoxifen (NOLVADEX) 10 MG tablet Take 2 tablets (20 mg total) by mouth daily. 180 tablet 3   No current facility-administered medications for this visit.      Allergies:  Allergies  Allergen Reactions  . Feraheme [Ferumoxytol] Hives    Past Medical History, Surgical history, Social history, and Family History were reviewed and updated.  Physical Exam: Blood pressure (!) 143/65, pulse 77, temperature 98.1 F (36.7 C), temperature source Oral, resp. rate 18, height 6' (1.829 m),  weight 137 lb (62.1 kg), SpO2 100 %. ECOG: 0 General appearance: alert and cooperative appeared without distress. Head: Normocephalic, without obvious abnormality no oral thrush or ulcers. Neck: no adenopathy Lymph nodes: Cervical, supraclavicular, and axillary nodes normal. Heart:regular rate and rhythm, S1, S2 normal, no murmur, click, rub or gallop Lung:chest clear, no wheezing, rales, normal symmetric air entry Abdomin: soft, non-tender, without masses or organomegaly EXT:no erythema, induration, or nodules   Lab Results: Lab Results  Component Value Date   WBC 4.7 06/11/2016   HGB 11.9 (L) 06/11/2016   HCT 36.8 (L) 06/11/2016   MCV 90.4 06/11/2016   PLT 212 06/11/2016     Chemistry      Component Value Date/Time   NA 143 06/11/2016 0804   K 3.4 (L) 06/11/2016 0804   CL 105 04/13/2013 0922   CO2 24 06/11/2016 0804   BUN 11.8 06/11/2016 0804   CREATININE 1.0 06/11/2016 0804      Component Value Date/Time   CALCIUM 8.5 06/11/2016 0804   ALKPHOS 27 (L) 06/11/2016 0804   AST 22 06/11/2016 0804   ALT 11 06/11/2016 0804   BILITOT 1.23 (H) 06/11/2016 0804      Impression and Plan:  66 year old gentleman with the following issues:  1.GIST: He is status post multiple surgeries and recurrences and currently with single 2.4 x 2 cm right hepatic lesion found on CT 12-2013 and confirmed by biopsy, not clear if progressed when off gleevec intermittently due to difficulty getting med, or progressed on that previous dose of gleevec.   He is S/P stereotactic radiation at [facility] 02-2015 and currently on Gleevec 200 mg daily.  Breast MRI obtained in June 2018 showed no evidence of recurrent disease.  The plan is to continue with the current dose and schedule. Repeat laboratory testing will be done today to assess for any complications related to [place].   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 I have  recommended continuing tamoxifen for at least 5 years total.   3.T1a renal cell carcinoma treated with right partial nephrectomy 2008. No evidence of relapse noted since that time.  4. B12 deficiency due to gastrectomy, on monthly B12 now by PCP  5. 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  6. Follow-up: Will be in 6 months to check on his clinical status.      Zola Button, MD 9/18/20181:11 PM

## 2017-07-09 NOTE — Telephone Encounter (Signed)
Gave patient avs report and appointments for March  °

## 2017-08-08 MED FILL — IMATINIB MESYLATE 100 MG TA: 100 | 30 days supply | Qty: 60 | Fill #7

## 2017-09-10 MED FILL — IMATINIB MESYLATE 100 MG TA: 100 | 30 days supply | Qty: 60 | Fill #8

## 2017-09-11 ENCOUNTER — Encounter: Payer: Self-pay | Admitting: *Deleted

## 2017-10-16 MED FILL — IMATINIB MESYLATE 100 MG TA: 100 | 30 days supply | Qty: 60 | Fill #9

## 2017-11-12 ENCOUNTER — Other Ambulatory Visit: Payer: Self-pay | Admitting: *Deleted

## 2017-11-12 DIAGNOSIS — C50422 Malignant neoplasm of upper-outer quadrant of left male breast: Secondary | ICD-10-CM

## 2017-11-12 MED ORDER — TAMOXIFEN CITRATE 10 MG PO TABS: 20.0000 mg | ORAL_TABLET | Freq: Every day | ORAL | 3 refills | Status: DC

## 2017-11-14 MED FILL — IMATINIB MESYLATE 100 MG TA: 100 | 15 days supply | Qty: 30 | Fill #10

## 2017-12-04 ENCOUNTER — Other Ambulatory Visit: Payer: Self-pay | Admitting: Oncology

## 2017-12-04 DIAGNOSIS — C49A2 Gastrointestinal stromal tumor of stomach: Secondary | ICD-10-CM

## 2017-12-10 MED FILL — IMATINIB MESYLATE 100 MG TA: 100 | 30 days supply | Qty: 60 | Fill #0

## 2018-01-03 ENCOUNTER — Encounter: Payer: Self-pay | Admitting: *Deleted

## 2018-01-07 ENCOUNTER — Inpatient Hospital Stay: Payer: Medicare Other

## 2018-01-07 ENCOUNTER — Telehealth: Payer: Self-pay

## 2018-01-07 ENCOUNTER — Inpatient Hospital Stay: Payer: Medicare Other | Attending: Oncology | Admitting: Oncology

## 2018-01-07 VITALS — BP 127/76 | HR 76 | Temp 98.6°F | Resp 20 | Ht 72.0 in | Wt 136.2 lb

## 2018-01-07 DIAGNOSIS — D649 Anemia, unspecified: Secondary | ICD-10-CM

## 2018-01-07 DIAGNOSIS — C49A0 Gastrointestinal stromal tumor, unspecified site: Secondary | ICD-10-CM

## 2018-01-07 DIAGNOSIS — Z905 Acquired absence of kidney: Secondary | ICD-10-CM | POA: Diagnosis not present

## 2018-01-07 DIAGNOSIS — D6489 Other specified anemias: Secondary | ICD-10-CM

## 2018-01-07 DIAGNOSIS — C49A2 Gastrointestinal stromal tumor of stomach: Secondary | ICD-10-CM | POA: Insufficient documentation

## 2018-01-07 DIAGNOSIS — C787 Secondary malignant neoplasm of liver and intrahepatic bile duct: Secondary | ICD-10-CM | POA: Insufficient documentation

## 2018-01-07 DIAGNOSIS — Z79899 Other long term (current) drug therapy: Secondary | ICD-10-CM | POA: Insufficient documentation

## 2018-01-07 DIAGNOSIS — C649 Malignant neoplasm of unspecified kidney, except renal pelvis: Secondary | ICD-10-CM

## 2018-01-07 DIAGNOSIS — Z8553 Personal history of malignant neoplasm of renal pelvis: Secondary | ICD-10-CM | POA: Diagnosis not present

## 2018-01-07 DIAGNOSIS — C50922 Malignant neoplasm of unspecified site of left male breast: Secondary | ICD-10-CM | POA: Insufficient documentation

## 2018-01-07 DIAGNOSIS — Z79810 Long term (current) use of selective estrogen receptor modulators (SERMs): Secondary | ICD-10-CM | POA: Insufficient documentation

## 2018-01-07 DIAGNOSIS — E611 Iron deficiency: Secondary | ICD-10-CM | POA: Insufficient documentation

## 2018-01-07 DIAGNOSIS — E538 Deficiency of other specified B group vitamins: Secondary | ICD-10-CM | POA: Diagnosis not present

## 2018-01-07 DIAGNOSIS — D509 Iron deficiency anemia, unspecified: Secondary | ICD-10-CM

## 2018-01-07 DIAGNOSIS — Z170 Estrogen receptor positive status [ER+]: Secondary | ICD-10-CM | POA: Insufficient documentation

## 2018-01-07 LAB — CBC WITH DIFFERENTIAL/PLATELET
BASOS ABS: 0.1 10*3/uL (ref 0.0–0.1)
Basophils Relative: 1 %
EOS ABS: 0.2 10*3/uL (ref 0.0–0.5)
EOS PCT: 3 %
HCT: 30.4 % — ABNORMAL LOW (ref 38.4–49.9)
Hemoglobin: 9.3 g/dL — ABNORMAL LOW (ref 13.0–17.1)
Lymphocytes Relative: 38 %
Lymphs Abs: 2.3 10*3/uL (ref 0.9–3.3)
MCH: 24.9 pg — ABNORMAL LOW (ref 27.2–33.4)
MCHC: 30.6 g/dL — ABNORMAL LOW (ref 32.0–36.0)
MCV: 81.5 fL (ref 79.3–98.0)
Monocytes Absolute: 0.6 10*3/uL (ref 0.1–0.9)
Monocytes Relative: 10 %
NEUTROS PCT: 48 %
Neutro Abs: 3 10*3/uL (ref 1.5–6.5)
PLATELETS: 324 10*3/uL (ref 140–400)
RBC: 3.73 MIL/uL — AB (ref 4.20–5.82)
RDW: 18 % — ABNORMAL HIGH (ref 11.0–14.6)
WBC: 6.1 10*3/uL (ref 4.0–10.3)

## 2018-01-07 LAB — COMPREHENSIVE METABOLIC PANEL
AST: 13 U/L (ref 5–34)
Albumin: 3.2 g/dL — ABNORMAL LOW (ref 3.5–5.0)
Alkaline Phosphatase: 24 U/L — ABNORMAL LOW (ref 40–150)
Anion gap: 5 (ref 3–11)
BILIRUBIN TOTAL: 1 mg/dL (ref 0.2–1.2)
BUN: 11 mg/dL (ref 7–26)
CO2: 28 mmol/L (ref 22–29)
CREATININE: 1.1 mg/dL (ref 0.70–1.30)
Calcium: 8.3 mg/dL — ABNORMAL LOW (ref 8.4–10.4)
Chloride: 108 mmol/L (ref 98–109)
Glucose, Bld: 121 mg/dL (ref 70–140)
Potassium: 3.8 mmol/L (ref 3.5–5.1)
Sodium: 141 mmol/L (ref 136–145)
TOTAL PROTEIN: 6.3 g/dL — AB (ref 6.4–8.3)

## 2018-01-07 NOTE — Telephone Encounter (Signed)
Printed avs and calender of upcoming appointment per orders 3/19 los

## 2018-01-07 NOTE — Progress Notes (Signed)
Hematology and Oncology Follow Up Visit  Henry [name] [id number] Mar 21, 1951 67 y.o. 01/07/2018 1:06 PM Henry [name], MDGennosa, [name], MD   Principle Diagnosis: 67 year old with  1. Gastrointestinal stromal tumor and subsequently developed metastatic disease in the liver.  This was discovered and in 2007 but currently has no active disease.  2. 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 His tumor is ER/PR positive and currently on tamoxifen since 10/20/2014.  3. Multifactorial anemia: Iron deficiency and B12 deficiency has been documented.  4. Renal cell carcinoma status post partial nephrectomy in 2008.  Prior Therapy: He is S/Psubtotal gastrectomy with Roux-en-Y in 2007 then briefly on study with [place] which he did not tolerate due to marked elevations in LFTs.   He had recurrent GIST in late 2012 with further surgery by Dr.Levine March 2011. Gleevec started at 200 mg after that.   Surgery by Dr. Clovis Riley Sep 27, 2011.  The pathology showed recurrent GIST 8.9 cm with resection margins not involved.     MRI at [facility] prior to SBRT reportedly showed 1.8 cm lesion. He had stereotactic radiosurgery to the liver lesion at [facility] thru 03-18-15, 5000cGy in 5 fractions completed 03-18-15.   Current therapy:   Gleevec 200 mg daily   Tamoxifen 20 mg daily since 2015.  Vitamin B12 injection 1000 g every 30 days given at his primary care office.   Interim History: [name] reports no major changes in his health.  He remains on Gleevec without any evidence of new toxicities.  He denies any nausea, fatigue or edema.  He remains active and continues to attend activities of daily living.  He travels frequently without any decline in ability to do so.  He reports he is eating well without any issues at this time.  He has no difficulties obtaining tamoxifen or Gleevec.  He does not report any fatigue or tiredness.  He denies any hematochezia or  melena.  He has been on iron supplements in the past but have discontinued it.  He does not report any headaches, blurry vision, syncope or seizures. He does not report any fevers, chills or sweats. He does not report any cough, wheezing or hemoptysis.  He does not report any chest pain, palpitation orthopnea.  He does not report any nausea, vomiting or abdominal pain. He does not report any frequency urgency or hesitancy. He is not report any skeletal complaints. Remaining review of systems is negative.  Medications: I have reviewed the patient's current medications.  Current Outpatient Medications  Medication Sig Dispense Refill  . Cyanocobalamin (VITAMIN B-12 IJ) Inject 1,000 mcg as directed every 30 (thirty) days. Reported on 12/12/2015    . imatinib (GLEEVEC) 100 MG tablet TAKE 2 TABLETS BY MOUTH DAILY. TAKE WITH MEALS AND LARGE GLASS OF WATER (CAUTION:CHEMO) 90 tablet 6  . tamoxifen (NOLVADEX) 10 MG tablet Take 2 tablets (20 mg total) by mouth daily. 180 tablet 3   No current facility-administered medications for this visit.      Allergies:  Allergies  Allergen Reactions  . Feraheme [Ferumoxytol] Hives    Past Medical History, Surgical history, Social history, and Family History were reviewed and updated.  Physical Exam: Blood pressure 127/76, pulse 76, temperature 98.6 F (37 C), temperature source Oral, resp. rate 20, height 6' (1.829 m), weight 136 lb 3.2 oz (61.8 kg), SpO2 99 %.   ECOG: 0 General appearance: Well-appearing gentleman without distress. Head: Atraumatic without abnormalities. Oropharynx: Without thrush  or ulcers. Eyes: No scleral icterus. Lymph nodes: Cervical, supraclavicular, and axillary nodes normal. Heart: Regular rate and rhythm without murmurs or gallops. Lung: Clear to auscultation without any rhonchi, wheezes or dullness to percussion. Abdomin: Soft, nontender without any rebound or guarding. Musculoskeletal: No joint deformity or effusion. Skin:  No rashes or lesions.  Lab Results: Lab Results  Component Value Date   WBC 5.3 07/09/2017   HGB 9.6 (L) 07/09/2017   HCT 29.9 (L) 07/09/2017   MCV 83.4 07/09/2017   PLT 272 07/09/2017     Chemistry      Component Value Date/Time   NA 142 07/09/2017 1336   K 3.7 07/09/2017 1336   CL 105 04/13/2013 0922   CO2 27 07/09/2017 1336   BUN 13.4 07/09/2017 1336   CREATININE 1.1 07/09/2017 1336      Component Value Date/Time   CALCIUM 8.7 07/09/2017 1336   ALKPHOS 25 (L) 07/09/2017 1336   AST 16 07/09/2017 1336   ALT 8 07/09/2017 1336   BILITOT 1.00 07/09/2017 1336      Impression and Plan:  67 year old man with  1.GIST diagnosed in 2007.  He had multiple surgeries and interventions outlined above.  He remains on Gleevec without any evidence of recurrent disease.    He continues to follow-up at [facility] where he gotten most of his care.  No complications related to [place] at this time.  Risks and benefits of continuing this medication was reviewed today and is agreeable to continue.   2. Breast cancer presented with T1N0M0  ductal carcinoma of left breast ER PR + HER2 negative. He is currently on tamoxifen since 2015 and the plan is to continue 5 years of therapy.  Risks and benefits of continuing this medication was reviewed today and is agreeable to continue.  3.T1a renal cell carcinoma in 2008.  He remains disease-free after nephrectomy in 2008.  No signs or symptoms of recurrent disease.  4.  Multifactorial anemia: He continues to receive B12 injection with his primary care physician.  He does have some microcytosis and iron deficiency but has not been on iron supplements.  I recommended restarting oral iron supplements on a daily basis.  Intravenous iron can be used in the future as needed.  6. Follow-up: Will be in 6 months repeat labs and follow clinically.  15  minutes was spent with the patient face-to-face today.  More than 50% of time was  dedicated to patient counseling, education and coordination of his multifaceted care.      Zola Button, MD 3/19/20191:06 PM

## 2018-01-08 MED FILL — IMATINIB MESYLATE 100 MG TA: 100 | 30 days supply | Qty: 60 | Fill #1

## 2018-02-03 MED FILL — GLEEVEC 100 MG TABLET: 100 | 30 days supply | Qty: 60 | Fill #2

## 2018-03-05 MED FILL — GLEEVEC 100 MG TABLET: 100 | 30 days supply | Qty: 60 | Fill #3

## 2018-04-15 MED FILL — GLEEVEC 100 MG TABLET: 100 | 30 days supply | Qty: 60 | Fill #4

## 2018-05-14 MED FILL — GLEEVEC 100 MG TABLET: 100 | 30 days supply | Qty: 60 | Fill #5

## 2018-06-16 MED FILL — GLEEVEC 100 MG TABLET: 100 | 30 days supply | Qty: 60 | Fill #6

## 2018-07-08 ENCOUNTER — Telehealth: Payer: Self-pay

## 2018-07-08 ENCOUNTER — Inpatient Hospital Stay: Payer: Medicare Other | Attending: Oncology | Admitting: Oncology

## 2018-07-08 ENCOUNTER — Inpatient Hospital Stay: Payer: Medicare Other

## 2018-07-08 VITALS — BP 103/73 | HR 71 | Temp 98.5°F | Resp 17 | Ht 72.0 in | Wt 127.8 lb

## 2018-07-08 DIAGNOSIS — D649 Anemia, unspecified: Secondary | ICD-10-CM

## 2018-07-08 DIAGNOSIS — C649 Malignant neoplasm of unspecified kidney, except renal pelvis: Secondary | ICD-10-CM

## 2018-07-08 DIAGNOSIS — Z79811 Long term (current) use of aromatase inhibitors: Secondary | ICD-10-CM | POA: Insufficient documentation

## 2018-07-08 DIAGNOSIS — D509 Iron deficiency anemia, unspecified: Secondary | ICD-10-CM

## 2018-07-08 DIAGNOSIS — Z79899 Other long term (current) drug therapy: Secondary | ICD-10-CM | POA: Diagnosis not present

## 2018-07-08 DIAGNOSIS — E538 Deficiency of other specified B group vitamins: Secondary | ICD-10-CM | POA: Diagnosis not present

## 2018-07-08 DIAGNOSIS — Z85528 Personal history of other malignant neoplasm of kidney: Secondary | ICD-10-CM | POA: Diagnosis not present

## 2018-07-08 DIAGNOSIS — C49A3 Gastrointestinal stromal tumor of small intestine: Secondary | ICD-10-CM | POA: Insufficient documentation

## 2018-07-08 DIAGNOSIS — Z170 Estrogen receptor positive status [ER+]: Secondary | ICD-10-CM | POA: Diagnosis not present

## 2018-07-08 DIAGNOSIS — C7B02 Secondary carcinoid tumors of liver: Secondary | ICD-10-CM | POA: Diagnosis not present

## 2018-07-08 DIAGNOSIS — C50929 Malignant neoplasm of unspecified site of unspecified male breast: Secondary | ICD-10-CM | POA: Diagnosis not present

## 2018-07-08 LAB — CMP (CANCER CENTER ONLY)
ALK PHOS: 22 U/L — AB (ref 38–126)
ALT: 10 U/L (ref 0–44)
AST: 17 U/L (ref 15–41)
Albumin: 3.4 g/dL — ABNORMAL LOW (ref 3.5–5.0)
Anion gap: 5 (ref 5–15)
BILIRUBIN TOTAL: 1.4 mg/dL — AB (ref 0.3–1.2)
BUN: 11 mg/dL (ref 8–23)
CALCIUM: 8.7 mg/dL — AB (ref 8.9–10.3)
CO2: 31 mmol/L (ref 22–32)
Chloride: 109 mmol/L (ref 98–111)
Creatinine: 0.94 mg/dL (ref 0.61–1.24)
GFR, Est AFR Am: >60 mL/min (ref >60)
GFR, Estimated: >60 mL/min (ref >60)
Glucose, Bld: 95 mg/dL (ref 70–99)
Potassium: 3.5 mmol/L (ref 3.5–5.1)
Sodium: 145 mmol/L (ref 135–145)
Total Protein: 6.2 g/dL — ABNORMAL LOW (ref 6.5–8.1)

## 2018-07-08 LAB — CBC WITH DIFFERENTIAL (CANCER CENTER ONLY)
Basophils Absolute: 0 10*3/uL (ref 0.0–0.1)
Basophils Relative: 0 %
EOS PCT: 2 %
Eosinophils Absolute: 0.1 10*3/uL (ref 0.0–0.5)
HCT: 37.6 % — ABNORMAL LOW (ref 38.4–49.9)
HEMOGLOBIN: 12.3 g/dL — AB (ref 13.0–17.1)
LYMPHS PCT: 29 %
Lymphs Abs: 2.1 10*3/uL (ref 0.9–3.3)
MCH: 32.3 pg (ref 27.2–33.4)
MCHC: 32.7 g/dL (ref 32.0–36.0)
MCV: 98.7 fL — AB (ref 79.3–98.0)
MONOS PCT: 15 %
Monocytes Absolute: 1.1 10*3/uL — ABNORMAL HIGH (ref 0.1–0.9)
NEUTROS PCT: 54 %
Neutro Abs: 3.9 10*3/uL (ref 1.5–6.5)
Platelet Count: 217 10*3/uL (ref 140–400)
RBC: 3.81 MIL/uL — AB (ref 4.20–5.82)
RDW: 15.2 % — ABNORMAL HIGH (ref 11.0–14.6)
WBC: 7.2 10*3/uL (ref 4.0–10.3)

## 2018-07-08 NOTE — Progress Notes (Signed)
Hematology and Oncology Follow Up Visit  Henry [name] [id number] 12-07-1950 67 y.o. 07/08/2018 2:38 PM [name], MDGennosa, [name], MD   Principle Diagnosis: 67 year old with  1. Gastrointestinal stromal tumor (GIST) of the small intestine with metastatic disease in the liver.  He was diagnosed in 2007 and underwent surgical resection and currently on maintenance imatinib.  2.  Breast cancer diagnosed in 2015.  He was found to have T1 N0 invasive ductal carcinoma of. His tumor is ER/PR positive and currently on tamoxifen since 10/20/2014.  3.  Anemia: Related to iron deficiency and B12 deficiency.  4.  Early stage renal cell carcinoma diagnosed in 2008.  Prior Therapy: He is S/Psubtotal gastrectomy with Roux-en-Y in 2007 then briefly on study with [place] which he did not tolerate due to marked elevations in LFTs.   He had recurrent GIST in late 2012 with further surgery by Dr.Levine March 2011. Gleevec started at 200 mg after that.   Surgery by Dr. Clovis Riley Sep 27, 2011.  The pathology showed recurrent GIST 8.9 cm with resection margins not involved.     He had stereotactic radiosurgery to the liver lesion at [facility] with a total 5000cGy in 5 fractions completed 03-18-15.   Current therapy:   Gleevec 200 mg daily   Tamoxifen 20 mg daily since 2015 to complete 5 years.  Vitamin B12 injection 1000 g every 30 days.   Interim History: [name] for a follow-up visit.  The last visit, he reports no major change in his health.  He does report some mild fatigue but otherwise continues to be active and attends to activities of daily living.  He denies any complications related to [place].  He denies any lower extremity edema or easy bruising.  He denies any issues related to tamoxifen intake at this time.  He denies any fluid retention or weight gain.  His appetite has been the same although he of lost few pounds since last visit.  He continues to live independently and  attends to activities of daily living.   He does not report any headaches, blurry vision, syncope or seizures.  He denies any alteration mental status or confusion.  He does not report any fevers, chills or sweats. He does not report any cough, wheezing or hemoptysis.  He does not report any chest pain, palpitation orthopnea.  He does not report any nausea, vomiting or abdominal pain.  He denies any changes in bowel habits.  He does not report any frequency urgency or hesitancy. He is not report any arthralgias or myalgias.  Remaining review of systems is negative.  Medications: I have reviewed the patient's current medications.  Current Outpatient Medications  Medication Sig Dispense Refill  . Cyanocobalamin (VITAMIN B-12 IJ) Inject 1,000 mcg as directed every 30 (thirty) days. Reported on 12/12/2015    . imatinib (GLEEVEC) 100 MG tablet TAKE 2 TABLETS BY MOUTH DAILY. TAKE WITH MEALS AND LARGE GLASS OF WATER (CAUTION:CHEMO) 90 tablet 6  . tamoxifen (NOLVADEX) 10 MG tablet Take 2 tablets (20 mg total) by mouth daily. 180 tablet 3   No current facility-administered medications for this visit.      Allergies:  Allergies  Allergen Reactions  . Feraheme [Ferumoxytol] Hives    Past Medical History, Surgical history, Social history, and Family History were reviewed and updated.  Physical Exam: Blood pressure 103/73, pulse 71, temperature 98.5 F (36.9 C), temperature source Oral, resp. rate 17, height 6' (1.829 m), weight 127 lb 12.8 oz (  58 kg), SpO2 99 %.    ECOG: 0   General appearance: Comfortable appearing without any discomfort Head: Normocephalic without any trauma Oropharynx: Mucous membranes are moist and pink without any thrush or ulcers. Eyes: Pupils are equal and round reactive to light. Lymph nodes: No cervical, supraclavicular, inguinal or axillary lymphadenopathy.   Heart:regular rate and rhythm.  S1 and S2 without leg edema. Lung: Clear without any rhonchi or wheezes.   No dullness to percussion. Abdomin: Soft, nontender, nondistended with good bowel sounds.  No hepatosplenomegaly. Musculoskeletal: No joint deformity or effusion.  Full range of motion noted. Neurological: No deficits noted on motor, sensory and deep tendon reflex exam. Skin: No petechial rash or dryness.   Chest wall examination did not show any breast masses or lesions.    Lab Results: Lab Results  Component Value Date   WBC 6.1 01/07/2018   HGB 9.3 (L) 01/07/2018   HCT 30.4 (L) 01/07/2018   MCV 81.5 01/07/2018   PLT 324 01/07/2018     Chemistry      Component Value Date/Time   NA 141 01/07/2018 1255   NA 142 07/09/2017 1336   K 3.8 01/07/2018 1255   K 3.7 07/09/2017 1336   CL 108 01/07/2018 1255   CL 105 04/13/2013 0922   CO2 28 01/07/2018 1255   CO2 27 07/09/2017 1336   BUN 11 01/07/2018 1255   BUN 13.4 07/09/2017 1336   CREATININE 1.10 01/07/2018 1255   CREATININE 1.1 07/09/2017 1336      Component Value Date/Time   CALCIUM 8.3 (L) 01/07/2018 1255   CALCIUM 8.7 07/09/2017 1336   ALKPHOS 24 (L) 01/07/2018 1255   ALKPHOS 25 (L) 07/09/2017 1336   AST 13 01/07/2018 1255   AST 16 07/09/2017 1336   ALT <6 01/07/2018 1255   ALT 8 07/09/2017 1336   BILITOT 1.0 01/07/2018 1255   BILITOT 1.00 07/09/2017 1336      Impression and Plan:  67 year old man with  1.GIST small intestines and stomach diagnosed in 2007.  He has documented hepatic metastasis that has been under control with Gleevec.  He continues to tolerate Gleevec therapy without any complications.  Last imaging studies obtained on Mar 21, 2018 included MRI of the liver at [facility] which showed no new lesions identified.  He does have a treated metastatic liver lesion.  The rationale for continuing [place] was reviewed today and risks and benefits of long-term therapy was reviewed.  Long-term complications were also discussed.  At this time is agreeable to continue.   2.  T1N0  left breast cancer diagnosed in 2015.  He has no evidence of recurrent disease at this time.  I have recommended continuing tamoxifen to complete 5 years of therapy which will conclude around the end of 2020.  3. Renal cell carcinoma diagnosed in 2008.  He was found to have T1a disease.  4.  Anemia: His anemia appears to be related to iron deficiency as well as B12 deficiency.  His hemoglobin appears adequate and he is mildly symptomatic.  I recommended continuing oral iron supplements as well as continuing B12 injections.  6. Follow-up: Will be in 6 months for repeat evaluation.  15  minutes was spent with the patient face-to-face today.  More than 50% of time was dedicated to reviewing the natural course of his disease, treatment options and long-term complications associated with this therapy.      Zola Button, MD 9/17/20192:38 PM

## 2018-07-08 NOTE — Telephone Encounter (Signed)
Printed avs and calender of upcoming appointment. Per 9/17 los 

## 2018-07-09 LAB — IRON AND TIBC
Iron: 149 ug/dL (ref 42–163)
Saturation Ratios: 44 % (ref 42–163)
TIBC: 336 ug/dL (ref 202–409)
UIBC: 187 ug/dL

## 2018-07-09 LAB — FERRITIN: FERRITIN: 26 ng/mL (ref 24–336)

## 2018-08-08 MED FILL — GLEEVEC 100 MG TABLET: 100 | 30 days supply | Qty: 60 | Fill #8

## 2018-09-01 MED FILL — GLEEVEC 100 MG TABLET: 100 | 30 days supply | Qty: 60 | Fill #9

## 2018-09-23 ENCOUNTER — Other Ambulatory Visit: Payer: Self-pay | Admitting: Oncology

## 2018-09-23 DIAGNOSIS — C49A2 Gastrointestinal stromal tumor of stomach: Secondary | ICD-10-CM

## 2018-09-25 MED FILL — GLEEVEC 100 MG TABLET: 100 | 30 days supply | Qty: 60 | Fill #0

## 2018-10-23 MED FILL — GLEEVEC 100 MG TABLET: 100 | 30 days supply | Qty: 60 | Fill #1

## 2018-11-19 MED FILL — GLEEVEC 100 MG TABLET: 100 | 30 days supply | Qty: 60 | Fill #2

## 2018-12-17 MED FILL — GLEEVEC 100 MG TABLET: 100 | 30 days supply | Qty: 60 | Fill #3

## 2018-12-25 ENCOUNTER — Telehealth: Payer: Self-pay | Admitting: Oncology

## 2018-12-25 NOTE — Telephone Encounter (Signed)
Call day, moved appt from 3/18 to 3/19. Called patient to confirm, per pt will be in [place] after 3/19.

## 2019-01-07 ENCOUNTER — Ambulatory Visit: Payer: Medicare Other | Admitting: Oncology

## 2019-01-07 ENCOUNTER — Other Ambulatory Visit: Payer: Medicare Other

## 2019-01-08 ENCOUNTER — Telehealth: Payer: Self-pay | Admitting: Oncology

## 2019-01-08 ENCOUNTER — Inpatient Hospital Stay (HOSPITAL_BASED_OUTPATIENT_CLINIC_OR_DEPARTMENT_OTHER): Payer: Medicare Other | Admitting: Oncology

## 2019-01-08 ENCOUNTER — Other Ambulatory Visit: Payer: Self-pay

## 2019-01-08 ENCOUNTER — Inpatient Hospital Stay: Payer: Medicare Other | Attending: Oncology

## 2019-01-08 VITALS — BP 100/65 | HR 80 | Temp 97.7°F | Resp 18 | Ht 72.0 in | Wt 112.3 lb

## 2019-01-08 DIAGNOSIS — R634 Abnormal weight loss: Secondary | ICD-10-CM

## 2019-01-08 DIAGNOSIS — Z79899 Other long term (current) drug therapy: Secondary | ICD-10-CM | POA: Diagnosis not present

## 2019-01-08 DIAGNOSIS — D509 Iron deficiency anemia, unspecified: Secondary | ICD-10-CM | POA: Insufficient documentation

## 2019-01-08 DIAGNOSIS — K529 Noninfective gastroenteritis and colitis, unspecified: Secondary | ICD-10-CM | POA: Diagnosis not present

## 2019-01-08 DIAGNOSIS — C649 Malignant neoplasm of unspecified kidney, except renal pelvis: Secondary | ICD-10-CM

## 2019-01-08 DIAGNOSIS — E538 Deficiency of other specified B group vitamins: Secondary | ICD-10-CM | POA: Diagnosis not present

## 2019-01-08 DIAGNOSIS — C49A3 Gastrointestinal stromal tumor of small intestine: Secondary | ICD-10-CM

## 2019-01-08 DIAGNOSIS — K629 Disease of anus and rectum, unspecified: Secondary | ICD-10-CM

## 2019-01-08 DIAGNOSIS — C50929 Malignant neoplasm of unspecified site of unspecified male breast: Secondary | ICD-10-CM

## 2019-01-08 DIAGNOSIS — D649 Anemia, unspecified: Secondary | ICD-10-CM

## 2019-01-08 DIAGNOSIS — Z170 Estrogen receptor positive status [ER+]: Secondary | ICD-10-CM | POA: Insufficient documentation

## 2019-01-08 DIAGNOSIS — Z853 Personal history of malignant neoplasm of breast: Secondary | ICD-10-CM

## 2019-01-08 DIAGNOSIS — R160 Hepatomegaly, not elsewhere classified: Secondary | ICD-10-CM

## 2019-01-08 DIAGNOSIS — R918 Other nonspecific abnormal finding of lung field: Secondary | ICD-10-CM

## 2019-01-08 LAB — CMP (CANCER CENTER ONLY)
ALT: 21 U/L (ref 0–44)
AST: 38 U/L (ref 15–41)
Albumin: 3.2 g/dL — ABNORMAL LOW (ref 3.5–5.0)
Alkaline Phosphatase: 23 U/L — ABNORMAL LOW (ref 38–126)
Anion gap: 12 (ref 5–15)
BUN: 13 mg/dL (ref 8–23)
CO2: 28 mmol/L (ref 22–32)
CREATININE: 1.12 mg/dL (ref 0.61–1.24)
Calcium: 8.1 mg/dL — ABNORMAL LOW (ref 8.9–10.3)
Chloride: 101 mmol/L (ref 98–111)
GFR, Est AFR Am: >60 mL/min (ref >60)
Glucose, Bld: 121 mg/dL — ABNORMAL HIGH (ref 70–99)
Potassium: 3.3 mmol/L — ABNORMAL LOW (ref 3.5–5.1)
Sodium: 141 mmol/L (ref 135–145)
Total Bilirubin: 1 mg/dL (ref 0.3–1.2)
Total Protein: 6.4 g/dL — ABNORMAL LOW (ref 6.5–8.1)

## 2019-01-08 LAB — CBC WITH DIFFERENTIAL (CANCER CENTER ONLY)
Abs Immature Granulocytes: 0.02 10*3/uL (ref 0.00–0.07)
Basophils Absolute: 0 10*3/uL (ref 0.0–0.1)
Basophils Relative: 1 %
EOS PCT: 0 %
Eosinophils Absolute: 0 10*3/uL (ref 0.0–0.5)
HCT: 43.4 % (ref 39.0–52.0)
Hemoglobin: 14.3 g/dL (ref 13.0–17.0)
IMMATURE GRANULOCYTES: 0 %
Lymphocytes Relative: 44 %
Lymphs Abs: 2.3 10*3/uL (ref 0.7–4.0)
MCH: 31.7 pg (ref 26.0–34.0)
MCHC: 32.9 g/dL (ref 30.0–36.0)
MCV: 96.2 fL (ref 80.0–100.0)
Monocytes Absolute: 0.4 10*3/uL (ref 0.1–1.0)
Monocytes Relative: 8 %
Neutro Abs: 2.4 10*3/uL (ref 1.7–7.7)
Neutrophils Relative %: 47 %
Platelet Count: 175 10*3/uL (ref 150–400)
RBC: 4.51 MIL/uL (ref 4.22–5.81)
RDW: 13.7 % (ref 11.5–15.5)
WBC Count: 5.1 10*3/uL (ref 4.0–10.5)
nRBC: 0 % (ref 0.0–0.2)

## 2019-01-08 LAB — IRON AND TIBC
Iron: 54 ug/dL (ref 42–163)
SATURATION RATIOS: 20 % (ref 20–55)
TIBC: 275 ug/dL (ref 202–409)
UIBC: 221 ug/dL (ref 117–376)

## 2019-01-08 LAB — FERRITIN: Ferritin: 242 ng/mL (ref 24–336)

## 2019-01-08 LAB — VITAMIN B12: Vitamin B-12: 1967 pg/mL — ABNORMAL HIGH (ref 180–914)

## 2019-01-08 NOTE — Progress Notes (Signed)
Hematology and Oncology Follow Up Visit  Henry [name] [id number] 11/04/1950 68 y.o. 01/08/2019 10:39 AM [name], MDGennosa, [name], MD   Principle Diagnosis: 68 year old with  1. 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  He status post surgical resection and currently on maintenance imatinib.   2.  T1N0 invasive ductal carcinoma of the breast cancer diagnosed in 2015. His tumor is ER/PR positive.   3.  Anemia: Due to iron deficiency and B12 deficiency.  Prior Therapy: He is S/Psubtotal gastrectomy with Roux-en-Y in 2007 then briefly on study with [facility] which he did not tolerate due to marked elevations in LFTs.   He had recurrent GIST in late 2012 with further surgery by Dr.Levine March 2011. Gleevec started at 200 mg after that.   Surgery by Dr. Clovis Riley Sep 27, 2011.  The pathology showed recurrent GIST 8.9 cm with resection margins not involved.     He had stereotactic radiosurgery to the liver lesion at [facility] with a total 5000cGy in 5 fractions completed 03-18-15.   Current therapy:   Gleevec 200 mg daily   Tamoxifen 20 mg daily since 2015 completed 5 years of therapy and discontinued.  Vitamin B12 injection 1000 g every 30 days.   Interim History: [name] returns today for a repeat evaluation.  Since the last visit, he has reported few complaints predominantly worsening diarrhea and weight loss.  Last 3 months he has reported for loose bowel habits a day without any abdominal pain or hematochezia.  He continues to be on imatinib and B12 injections but no other medication.  He has reported some mild fatigue and tiredness but still able to attend activities of daily living.   He does not report any headaches, blurry vision, syncope or seizures.  He denies any alteration mental status or confusion.  He does not report any fevers, chills or sweats. He does not report any cough,  wheezing or hemoptysis.  He does not report any chest pain, palpitation orthopnea.  He does not report any nausea, vomiting or abdominal pain. He does not report any frequency urgency or hesitancy. He is not report any arthralgias or myalgias.  Remaining review of systems is negative.  Medications: I have reviewed the patient's current medications.  Current Outpatient Medications  Medication Sig Dispense Refill  . Cyanocobalamin (VITAMIN B-12 IJ) Inject 1,000 mcg as directed every 30 (thirty) days. Reported on 12/12/2015    . GLEEVEC 100 MG tablet TAKE 2 TABLETS BY MOUTH DAILY. TAKE WITH MEALS AND LARGE GLASS OF WATER (CAUTION:CHEMO) 90 tablet 6  . tamoxifen (NOLVADEX) 10 MG tablet Take 2 tablets (20 mg total) by mouth daily. 180 tablet 3   No current facility-administered medications for this visit.      Allergies:  Allergies  Allergen Reactions  . Feraheme [Ferumoxytol] Hives    Past Medical History, Surgical history, Social history, and Family History were reviewed and updated.  Physical Exam: Blood pressure 100/65, pulse 80, temperature 97.7 F (36.5 C), temperature source Oral, resp. rate 18, height 6' (1.829 m), weight 112 lb 4.8 oz (50.9 kg), SpO2 98 %.     ECOG: 0   General appearance: Comfortable appearing without any discomfort Head: Normocephalic without any trauma Oropharynx: Mucous membranes are moist and pink without any thrush or ulcers. Eyes: Pupils are equal and round reactive to light. Lymph nodes: No cervical, supraclavicular, inguinal or axillary lymphadenopathy.   Heart:regular rate and rhythm.  S1 and S2 without leg edema. Lung: Clear without any rhonchi or wheezes.  No dullness to percussion. Abdomin: Soft, nontender, nondistended with good bowel sounds.  No hepatosplenomegaly. Musculoskeletal: No joint deformity or effusion.  Full range of motion noted. Neurological: No deficits noted on motor, sensory and deep tendon reflex exam. Skin: No petechial  rash or dryness.   Chest wall examination did not show any breast masses or lesions.    Lab Results: Lab Results  Component Value Date   WBC 7.2 07/08/2018   HGB 12.3 (L) 07/08/2018   HCT 37.6 (L) 07/08/2018   MCV 98.7 (H) 07/08/2018   PLT 217 07/08/2018     Chemistry      Component Value Date/Time   NA 145 07/08/2018 1427   NA 142 07/09/2017 1336   K 3.5 07/08/2018 1427   K 3.7 07/09/2017 1336   CL 109 07/08/2018 1427   CL 105 04/13/2013 0922   CO2 31 07/08/2018 1427   CO2 27 07/09/2017 1336   BUN 11 07/08/2018 1427   BUN 13.4 07/09/2017 1336   CREATININE 0.94 07/08/2018 1427   CREATININE 1.1 07/09/2017 1336      Component Value Date/Time   CALCIUM 8.7 (L) 07/08/2018 1427   CALCIUM 8.7 07/09/2017 1336   ALKPHOS 22 (L) 07/08/2018 1427   ALKPHOS 25 (L) 07/09/2017 1336   AST 17 07/08/2018 1427   AST 16 07/09/2017 1336   ALT 10 07/08/2018 1427   ALT 8 07/09/2017 1336   BILITOT 1.4 (H) 07/08/2018 1427   BILITOT 1.00 07/09/2017 1336      Impression and Plan:  68 year old man with  1. Gastrointestinal stromal tumor of the small intestines diagnosed in 2007.  He is currently on imatinib without any evidence of relapse.    Imaging studies obtained in November 2019 at [facility] showed no evidence of disease.  Risks and benefits of continuing imatinib was discussed today and is agreeable to continue.  I doubt his GI complaints are related to this medication.   2.  Breast cancer diagnosed in 2015.  He presented with T1N0 disease.  He is status post surgical resection and 5 years of tamoxifen.  No evidence of relapsed disease at this time.  3.  Chronic diarrhea: Etiology is under investigation.  Could be related to his previous abdominal surgery and pancreatic insufficiency.  I will refer him to gastroenterology for evaluation and management.  Given his severe weight loss I will obtain imaging studies of the chest abdomen and pelvis to rule out  any malignancy.  He had multiple malignancies in the past and rule out new metastatic disease would be prudent at this time.  4.  Anemia: Multifactorial in nature related to iron deficiency and B12 deficiency.  Hemoglobin is normal at this time.  5. Follow-up: Will be in 1 months to follow his progress.  25  minutes was spent with the patient face-to-face today.  More than 50% of time was dedicated to discussing his disease status, differential diagnosis for his symptoms and management strategies.      Zola Button, MD 3/19/202010:39 AM

## 2019-01-08 NOTE — Telephone Encounter (Signed)
Gave avs and calendar ° °

## 2019-01-12 ENCOUNTER — Other Ambulatory Visit: Payer: Self-pay

## 2019-01-12 ENCOUNTER — Ambulatory Visit (HOSPITAL_COMMUNITY)
Admission: RE | Admit: 2019-01-12 | Discharge: 2019-01-12 | Disposition: A | Discharge status: Home / Self Care | Payer: Medicare Other | Source: Ambulatory Visit | Attending: Oncology | Admitting: Oncology

## 2019-01-12 DIAGNOSIS — R918 Other nonspecific abnormal finding of lung field: Secondary | ICD-10-CM | POA: Insufficient documentation

## 2019-01-12 DIAGNOSIS — R160 Hepatomegaly, not elsewhere classified: Secondary | ICD-10-CM | POA: Diagnosis present

## 2019-01-12 DIAGNOSIS — C50929 Malignant neoplasm of unspecified site of unspecified male breast: Secondary | ICD-10-CM | POA: Diagnosis present

## 2019-01-12 DIAGNOSIS — R634 Abnormal weight loss: Secondary | ICD-10-CM | POA: Insufficient documentation

## 2019-01-12 MED ORDER — SODIUM CHLORIDE (PF) 0.9 % IJ SOLN
INTRAMUSCULAR | Status: AC
  Filled 2019-01-12: qty 50

## 2019-01-12 MED ORDER — IOHEXOL 300 MG/ML  SOLN
100.0000 mL | Freq: Once | INTRAMUSCULAR | Status: AC | PRN
  Administered 2019-01-12: 100 mL via INTRAVENOUS

## 2019-01-12 MED ORDER — IOHEXOL 300 MG/ML  SOLN
30.0000 mL | Freq: Once | INTRAMUSCULAR | Status: AC | PRN
  Administered 2019-01-12: 30 mL via ORAL

## 2019-01-12 MED FILL — GLEEVEC 100 MG TABLET: 100 | 30 days supply | Qty: 60 | Fill #4

## 2019-01-13 ENCOUNTER — Telehealth: Payer: Self-pay | Admitting: *Deleted

## 2019-01-13 ENCOUNTER — Telehealth: Payer: Self-pay | Admitting: Internal Medicine

## 2019-01-13 NOTE — Telephone Encounter (Signed)
Dr. Henrene Pastor, there is an urgent referral for this pt.  Pt has had 5-6 weeks of chronic diarrhea and 20-lb weight loss in that timeframe.  Pt was seen at [facility] from 2007-2011.  Recent GI hx at [facility].  Records will be placed on your desk for review.

## 2019-01-13 NOTE — Telephone Encounter (Signed)
-----   Message from Wyatt Portela, MD sent at 01/13/2019  8:20 AM EDT ----- Please let him know his scan did not show any cancer or obvious cause for his diarrhea.

## 2019-01-13 NOTE — Telephone Encounter (Signed)
Spoke with patient, per dr Alen Blew, scan did not show any cancer or obvious cause for his diarrhea.

## 2019-01-14 ENCOUNTER — Encounter: Payer: Self-pay | Admitting: Oncology

## 2019-01-14 NOTE — Telephone Encounter (Signed)
Okay to resume care with this office.  I will be out for the next week.  Set him up to see 1 of our Advanced Practitioners for an in office face-to-face visit next available, soon.  Records will be saved.  Thank you

## 2019-01-15 ENCOUNTER — Other Ambulatory Visit: Payer: Self-pay

## 2019-01-15 ENCOUNTER — Ambulatory Visit (INDEPENDENT_AMBULATORY_CARE_PROVIDER_SITE_OTHER): Payer: Medicare Other | Admitting: Physician Assistant

## 2019-01-15 ENCOUNTER — Ambulatory Visit (HOSPITAL_COMMUNITY): Admission: RE | Admit: 2019-01-15 | Payer: Medicare Other | Source: Ambulatory Visit

## 2019-01-15 ENCOUNTER — Encounter: Payer: Self-pay | Admitting: Physician Assistant

## 2019-01-15 ENCOUNTER — Other Ambulatory Visit: Payer: Medicare Other

## 2019-01-15 VITALS — BP 110/72 | HR 76 | Ht 72.0 in | Wt 110.0 lb

## 2019-01-15 DIAGNOSIS — Z8509 Personal history of malignant neoplasm of other digestive organs: Secondary | ICD-10-CM

## 2019-01-15 DIAGNOSIS — R634 Abnormal weight loss: Secondary | ICD-10-CM | POA: Diagnosis not present

## 2019-01-15 DIAGNOSIS — Z903 Acquired absence of stomach [part of]: Secondary | ICD-10-CM

## 2019-01-15 DIAGNOSIS — R197 Diarrhea, unspecified: Secondary | ICD-10-CM

## 2019-01-15 MED ORDER — SACCHAROMYCES BOULARDII 250 MG PO CAPS: 250.0000 mg | ORAL_CAPSULE | Freq: Two times a day (BID) | ORAL | 1 refills | Status: DC

## 2019-01-15 NOTE — Patient Instructions (Addendum)
  Your provider has requested that you go to the basement level for lab work before leaving today. Press "B" on the elevator. The lab is located at the first door on the left as you exit the elevator.   We have sent the following medications to your pharmacy for you to pick up at your convenience: Florastor   Push fluids and try to eat small frequent meals.   I appreciate the opportunity to care for you. Amy Esterwood, PA-C

## 2019-01-15 NOTE — Progress Notes (Signed)
Subjective:    Patient ID: Henry [name], adult    DOB: Mar 07, 1951, 68 y.o.   MRN: [id number]  HPI Henry [name] is a pleasant 68 year old white male, known remotely to Dr. Henrene Pastor , referred by Dr. Shadad/oncology for evaluation of 69-month history of diarrhea and 68 pound weight loss. Patient has complicated past medical history.  He was diagnosed with a Gastric GIST in 2007 and underwent subtotal gastrectomy with Roux-en-Y gastrojejunostomy.  He was treated with chemotherapy but developed hepatic toxicity and treatment was stopped.  He was diagnosed with a recurrent gist in 2012 and required recurrent resection and was subsequently placed on Gleevec.  He is also status post splenectomy has history of chronic B12 deficiency, iron deficiency, bipolar disorder and prior history of  breast cancer  . Marland Kitchen  Patient had MRI of the abdomen in November 2019 that showed stable treated metastatic lesion in the right hepatic lobe. He says that he is had some issues with dumping syndrome ever since his original surgery has managed well with diet.  Appendectomy had only been plagued with occasional diarrhea. As his current symptoms started in November 2019 with significant increase in diarrhea and by 68 late December early January he stopped having any formed stools and developed pasty wet diarrhea.  Now over the past couple of weeks stool has changed to a watery diarrhea.  He is only having 1-2 bowel movements per day and says he does not pass a lot of volume and that it actually require some effort to evacuate the liquid stool.  He has not noticed any blood.  Has not had any fever or chills.  He denies any abdominal pain bloating cramping or gas. States that over the past 3 to 4 weeks he had not had much appetite at all but starting yesterday his appetite seemed to improve and he listed out everything he had eaten which was a significant amount.  He had lost a total of about 20 pounds since November.  He denies any nausea  or vomiting.  He has felt weak. He was seen by Dr. Alen Blew on 3/68/2020, labs revealed a normal CBC, potassium of 3.2 and otherwise unremarkable chemistries.  On chronic B12 replacement and B12 level was high. He had CT of the abdomen and pelvis done on 01/12/2019 which shows multiple ill-defined groundglass densities in the right lung.  There were no new or enlarging liver lesions.,  2 small cysts in the pancreatic body 1 8 mm and 1 4 mm both read as stable and possible indolent cystic neoplasms.  There were no pathologically enlarged lymph nodes and bowel was unremarkable. On further questioning he did take 2 recent courses of Augmentin or ampicillin in the past 2 months for dental issues.               Review of Systems Pertinent positive and negative review of systems were noted in the above HPI section.  All other review of systems was otherwise negative.  Outpatient Encounter Medications as of 01/15/2019  Medication Sig  . Cyanocobalamin (VITAMIN B-12 IJ) Inject 1,000 mcg as directed every 30 (thirty) days. Reported on 12/12/2015  . GLEEVEC 100 MG tablet TAKE 2 TABLETS BY MOUTH DAILY. TAKE WITH MEALS AND LARGE GLASS OF WATER (CAUTION:CHEMO)  . saccharomyces boulardii (FLORASTOR) 250 MG capsule Take 1 capsule (250 mg total) by mouth 2 (two) times daily.  . [DISCONTINUED] tamoxifen (NOLVADEX) 10 MG tablet Take 2 tablets (20 mg total) by mouth daily.   No facility-administered  encounter medications on file as of 01/15/2019.    Allergies  Allergen Reactions  . Feraheme [Ferumoxytol] Hives   Patient Active Problem List   Diagnosis Date Noted  . Gastrointestinal stromal tumor of stomach ([place]) 06/16/2016  . High risk medication use 12/14/2015  . Renal cell cancer ([place]) 12/14/2015  . Other iron deficiency anemias 12/14/2015  . Malignant gastrointestinal stromal tumor ([place]) 12/14/2015  . Malignant gastrointestinal stromal tumor (GIST) of stomach ([place]) 07/31/2015  . Need for prophylactic  vaccination and inoculation against influenza 07/31/2015  . Unintentional weight loss 06/05/2015  . Metastatic cancer to liver ([place]) 03/27/2015  . Liver mass   . Vision changes 08/11/2014  . Breast cancer of upper-outer quadrant of left male breast ([place]) 10/20/2013  . Atherosclerosis of aortic arch ([place]) 09/29/2013  . Atherosclerosis of abdominal aorta ([place]) 09/29/2013  . B12 deficiency 04/19/2013  . 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 GIST (gastrointestinal stromal tumor), malignant ([place]) 08/19/2011  . ANEMIA 01/13/2008  . INTERNAL HEMORRHOIDS 01/13/2008  . HEMORRHOIDS, EXTERNAL 01/13/2008  . Pneumothorax 01/13/2008  . BIPOLAR AFFECTIVE DISORDER, HX OF 01/13/2008  . LIVER FUNCTION TESTS, ABNORMAL, HX OF 01/13/2008  . SPONTANEOUS PNEUMOTHORAX 01/13/2008  . Malignant neoplasm of kidney excluding renal pelvis ([place]) 10/14/2007  . HERNIA 02/05/2007  . HX, PERSONAL, TOBACCO USE 02/05/2007   Social History   Socioeconomic History  . Marital status: Widowed    Spouse name: Not on file  . Number of children: 0  . Years of education: Not on file  . Highest education level: Not on file  Occupational History  . Occupation: OWNER    Employer: [employer]  . Financial resource strain: Not on file  . Food insecurity:    Worry: Not on file    Inability: Not on file  . Transportation needs:    Medical: Not on file    Non-medical: Not on file  Tobacco Use  . Smoking status: Former Smoker    Packs/day: 1.50    Years: 25.00    Pack years: 37.50    Last attempt to quit: 07/03/1989    Years since quitting: 29.5  . Smokeless tobacco: Never Used  Substance and Sexual Activity  . Alcohol use: No  . Drug use: No  . Sexual activity: Not on file  Lifestyle  . Physical activity:    Days per week: Not on file    Minutes per session: Not on file  . Stress: Not on file  Relationships  . Social connections:    Talks on phone: Not on file    Gets together: Not on file     Attends religious service: Not on file    Active member of club or organization: Not on file    Attends meetings of clubs or organizations: Not on file    Relationship status: Not on file  . Intimate partner violence:    Fear of current or ex partner: Not on file    Emotionally abused: Not on file    Physically abused: Not on file    Forced sexual activity: Not on file  Other Topics Concern  . Not on file  Social History Narrative  . Not on file    Ms. Paternostro's family history includes Brain cancer in her cousin; Brain cancer (age of onset: 2) in her cousin; Breast cancer in her mother; Other in her sister; Ovarian cancer in her mother; Pneumonia in her father.      Objective:  Vitals:   01/15/19 0850  BP: 110/72  Pulse: 76    Physical Exam; well-developed older white male in no acute distress, pleasant, quite thin.  Height 6 foot, weight 110, BMI 14.9.  HEENT; nontraumatic normocephalic EOMI PERRLA sclera anicteric, Oral mucosa moist, Cardiovascular; regular rate and rhythm with S1-S2, Pulmonary;clear bilaterally, Abdomen ;soft, nontender nondistended bowel sounds are active there is no succussion splash, is midline incisional scars mass or hepatosplenomegaly.  Rectal; exam not done, Extremities; no clubbing cyanosis or edema skin warm and dry, Neuropsych; alert and oriented, grossly nonfocal mood and affect appropriate       Assessment & Plan:   #75 68 year old white male with 20 pound weight loss and persistent diarrhea x3 months.  This is in the setting of history of malignant gastric GI ST noticed in 2007 and for which he underwent a subtotal gastrectomy with Roux-en-Y gastrojejunostomy.  He was found to have a metastatic lesion in the liver in 2012.  He has been on [place] therapy over the past several years.  He also had to have resection of retroperitoneal recurrence in 2012.  Recent imaging with CT of the abdomen pelvis and chest showed ill-defined groundglass densities  in the right lung, stable previously treated liver lesion, 2 small pancreatic body cysts disease possible indolent cystic neoplasm), no evidence of metastatic disease in the abdomen.  Etiology of the diarrhea and weight loss is not clear.  Consider infectious etiologies, rule out C. difficile, rule out pancreatic insufficiency, rule out intestinal bacterial overgrowth.  #2 status post splenectomy 3.  Status post cholecystectomy 4.  Status post partial nephrectomy 5.  History of breast cancer 6.  Chronic B12 deficiency on replacement 7.  Bipolar disorder 8.  History of iron deficiency  Plan; GI pathogen panel, stool for C. difficile PCR, fecal elastase and stool for lactoferrin. Tart Florastor 1 p.o. twice daily x2 months. After stool studies return if unrevealing consider course of Xifaxan for small intestinal bacterial overgrowth and probable EGD and colonoscopy with Dr. Henrene Pastor. Patient is only having 1-2 small-volume bowel movements currently therefore will not start antidiarrheals or trial of Questran. We discussed adequate hydration, small lower carb, low residue meals.  Jaryn Hocutt S Marketta Valadez PA-C 01/15/2019   Cc: [name], MD

## 2019-01-20 NOTE — Progress Notes (Signed)
Assessment and plan reviewed 

## 2019-01-21 ENCOUNTER — Telehealth: Payer: Self-pay | Admitting: Physician Assistant

## 2019-01-21 NOTE — Telephone Encounter (Signed)
spoke with the patient

## 2019-01-21 NOTE — Telephone Encounter (Signed)
Pt cll wanting to speak with nurse to discuss his lab results.

## 2019-01-22 ENCOUNTER — Ambulatory Visit: Payer: Medicare Other | Admitting: Oncology

## 2019-01-22 ENCOUNTER — Other Ambulatory Visit: Payer: Medicare Other

## 2019-01-22 LAB — FECAL LACTOFERRIN, QUANT
Fecal Lactoferrin: NEGATIVE
MICRO NUMBER:: 355691
SPECIMEN QUALITY:: ADEQUATE

## 2019-01-22 LAB — GASTROINTESTINAL PATHOGEN PANEL PCR
C. DIFFICILE TOX A/B, PCR: NOT DETECTED
Campylobacter, PCR: NOT DETECTED
Cryptosporidium, PCR: NOT DETECTED
E coli (ETEC) LT/ST PCR: NOT DETECTED
E coli (STEC) stx1/stx2, PCR: NOT DETECTED
E coli 0157, PCR: NOT DETECTED
Giardia lamblia, PCR: NOT DETECTED
Norovirus, PCR: NOT DETECTED
ROTAVIRUS, PCR: NOT DETECTED
Salmonella, PCR: NOT DETECTED
Shigella, PCR: NOT DETECTED

## 2019-01-22 LAB — PANCREATIC ELASTASE, FECAL: Pancreatic Elastase-1, Stool: 393 mcg/g

## 2019-01-22 LAB — CLOSTRIDIUM DIFFICILE TOXIN B, QUALITATIVE, REAL-TIME PCR: Toxigenic C. Difficile by PCR: NOT DETECTED

## 2019-01-26 ENCOUNTER — Other Ambulatory Visit: Payer: Self-pay

## 2019-01-26 MED ORDER — METRONIDAZOLE 250 MG PO TABS: 250.0000 mg | ORAL_TABLET | Freq: Three times a day (TID) | ORAL | 0 refills | Status: AC

## 2019-01-29 ENCOUNTER — Telehealth: Payer: Self-pay | Admitting: Oncology

## 2019-01-29 NOTE — Telephone Encounter (Signed)
Changed 4/22 appt to telephone visit per sch msg. Called patient. No answer. Left message.

## 2019-02-09 MED FILL — GLEEVEC 100 MG TABLET: 100 | 30 days supply | Qty: 60 | Fill #5

## 2019-02-11 ENCOUNTER — Telehealth: Payer: Self-pay | Admitting: Oncology

## 2019-02-11 ENCOUNTER — Inpatient Hospital Stay: Payer: Medicare Other | Attending: Oncology | Admitting: Oncology

## 2019-02-11 DIAGNOSIS — D509 Iron deficiency anemia, unspecified: Secondary | ICD-10-CM

## 2019-02-11 DIAGNOSIS — C649 Malignant neoplasm of unspecified kidney, except renal pelvis: Secondary | ICD-10-CM | POA: Diagnosis not present

## 2019-02-11 DIAGNOSIS — E538 Deficiency of other specified B group vitamins: Secondary | ICD-10-CM

## 2019-02-11 NOTE — Telephone Encounter (Signed)
Scheduled appt per 4/22 sch message.

## 2019-02-11 NOTE — Progress Notes (Signed)
Hematology and Oncology Follow Up for Telemedicine Visits  Henry [name] [id number] 1951/05/05 68 y.o. 02/11/2019 1:38 PM Henry [name], MDGennosa, [name], MD   I connected with [name] on 02/11/19 at  3:45 PM EDT by telephone visit and verified that I am speaking with the correct person using two identifiers.   I discussed the limitations, risks, security and privacy concerns of performing an evaluation and management service by telemedicine and the availability of in-person appointments. I also discussed with the patient that there may be a patient responsible charge related to this service. The patient expressed understanding and agreed to proceed.  Other persons participating in the visit and their role in the encounter: None  Patient's location: Home Provider's location: Office    Principle Diagnosis: 68 year old man with gastrointestinal stromal tumor currently on maintenance imatinib.  He developed weight loss and diarrhea in March 2020.   Prior Therapy:  He is S/Psubtotal gastrectomy with Roux-en-Y in 2007 then briefly on study with [place] which he did not tolerate due to marked elevations in LFTs.   He had recurrent GIST in late 2012 with further surgery by Dr.Levine March 2011. Gleevec started at 200 mg after that.   Surgery by Dr. Clovis Riley Sep 27, 2011.  The pathology showed recurrent GIST 8.9 cm with resection margins not involved.     He had stereotactic radiosurgery to the liver lesion at [facility] with a total 5000cGy in 5 fractions completed 03-18-15.   Current therapy:  Gleevec 200 mg daily   Tamoxifen 20 mg daily since 2015 completed 5 years of therapy and discontinued.  Vitamin B12 injection 1000 g every 30 days  Interim History: [name] reports feeling better since the last time I saw him.  He was evaluated by Dr. Henrene Pastor from gastroenterology regarding his GI complaints.  His work-up was completed without needing EGD and completed to weeks  of metronidazole.  He reports his GI symptoms are improving with his stools are being more formed.  He has gained 5 pounds and overall quality of life and performance status is improving.  Continues to receive B12 injection by his primary care physician.  Denies any respiratory complaints at this time.  He denies any cough or recent fevers.  He has regained all activities of daily living.    Medications: I have reviewed the patient's current medications.  Current Outpatient Medications  Medication Sig Dispense Refill  . Cyanocobalamin (VITAMIN B-12 IJ) Inject 1,000 mcg as directed every 30 (thirty) days. Reported on 12/12/2015    . GLEEVEC 100 MG tablet TAKE 2 TABLETS BY MOUTH DAILY. TAKE WITH MEALS AND LARGE GLASS OF WATER (CAUTION:CHEMO) 90 tablet 6  . saccharomyces boulardii (FLORASTOR) 250 MG capsule Take 1 capsule (250 mg total) by mouth 2 (two) times daily. 60 capsule 1   No current facility-administered medications for this visit.      Allergies:  Allergies  Allergen Reactions  . Feraheme [Ferumoxytol] Hives    Past Medical History, Surgical history, Social history, and Family History were reviewed and updated.  Lab Results: Lab Results  Component Value Date   WBC 5.1 01/08/2019   HGB 14.3 01/08/2019   HCT 43.4 01/08/2019   MCV 96.2 01/08/2019   PLT 175 01/08/2019     Chemistry      Component Value Date/Time   NA 141 01/08/2019 1025   NA 142 07/09/2017 1336   K 3.3 (L) 01/08/2019 1025   K 3.7 07/09/2017 1336   CL  101 01/08/2019 1025   CL 105 04/13/2013 0922   CO2 28 01/08/2019 1025   CO2 27 07/09/2017 1336   BUN 13 01/08/2019 1025   BUN 13.4 07/09/2017 1336   CREATININE 1.12 01/08/2019 1025   CREATININE 1.1 07/09/2017 1336      Component Value Date/Time   CALCIUM 8.1 (L) 01/08/2019 1025   CALCIUM 8.7 07/09/2017 1336   ALKPHOS 23 (L) 01/08/2019 1025   ALKPHOS 25 (L) 07/09/2017 1336   AST 38 01/08/2019 1025   AST 16 07/09/2017 1336   ALT 21 01/08/2019 1025    ALT 8 07/09/2017 1336   BILITOT 1.0 01/08/2019 1025   BILITOT 1.00 07/09/2017 1336       Radiological Studies:  IMPRESSION: 1. No definite evidence of recurrent or metastatic carcinoma within the chest, abdomen or pelvis. 2. Multiple ill-defined areas of ground-glass opacity throughout right lung, which are new since previous study. These may be due to inflammatory or infectious etiology, with low-grade adenocarcinoma considered much less likely given multifocality. Recommend follow-up by chest CT in 6 months. This recommendation follows the consensus statement: Guidelines for Management of Small Pulmonary Nodules Detected on CT Images: From the Fleischner Society 2017; Radiology 2017; 284:228-243. 3. Slight increase in size right middle lobe pulmonary nodules measuring 5 mm or less. Recommend continued attention on follow-up CT. 4. 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 Recommend continued follow-up by MRI or CT in 1 year. This recommendation follows ACR consensus guidelines: Management of Incidental Pancreatic Cysts: A White Paper of the ACR Incidental Findings Committee. [place] 4431;54:008-676.   Impression and Plan:  68 year old man with  1. Gastrointestinal stromal tumor (GIST) diagnosed in 2007.  He remains on imatinib without any evidence of relapsed disease.    CT scan obtained on 323 was personally reviewed and discussed with the patient and showed no evidence of recurrent disease.  I recommended continue maintenance imatinib at this time without any dose reduction or delay.    2.  Chronic diarrhea: Improved at this time with work-up completed by GI.  His appetite has improved and gained weight.  No evidence of malignancy detected.  3.  Anemia: His hemoglobin is within normal range and iron studies showed no iron deficiency.  He continues to receive vitamin B12 injections  with his primary care physician.  4.  Groundglass opacity in the lung: Appears to be infectious rather than malignancy.  I recommended repeat imaging studies in 6 to 12 months.  5. Follow-up: Will be in  September 2020.    I discussed the assessment and treatment plan with the patient. The patient was provided an opportunity to ask questions and all were answered. The patient agreed with the plan and demonstrated an understanding of the instructions.   The patient was advised to call back or seek an in-person evaluation if the symptoms worsen or if the condition fails to improve as anticipated.  I provided 20 minutes of non face-to-face telephone visit time during this encounter, and > 50% was dedicated to discussing his laboratory data, CT scan results and answering questions regarding future plan of care.  Zola Button, MD 02/11/2019 1:38 PM

## 2019-03-09 MED FILL — GLEEVEC 100 MG TABLET: 100 | 30 days supply | Qty: 60 | Fill #6

## 2019-04-09 MED FILL — GLEEVEC 100 MG TABLET: 100 | 30 days supply | Qty: 60 | Fill #7

## 2019-05-07 MED FILL — GLEEVEC 100 MG TABLET: 100 | 30 days supply | Qty: 60 | Fill #8

## 2019-05-12 ENCOUNTER — Telehealth: Payer: Self-pay | Admitting: Oncology

## 2019-05-12 NOTE — Telephone Encounter (Signed)
Adjusted 9/25 lab time. Left message. Schedule mailed.

## 2019-06-11 MED FILL — GLEEVEC 100 MG TABLET: 100 | 30 days supply | Qty: 60 | Fill #9

## 2019-07-09 ENCOUNTER — Other Ambulatory Visit: Payer: Self-pay | Admitting: Oncology

## 2019-07-09 DIAGNOSIS — C49A2 Gastrointestinal stromal tumor of stomach: Secondary | ICD-10-CM

## 2019-07-13 MED FILL — GLEEVEC 100 MG TABLET: 100 | 30 days supply | Qty: 60 | Fill #0

## 2019-07-17 ENCOUNTER — Other Ambulatory Visit: Payer: Self-pay

## 2019-07-17 ENCOUNTER — Inpatient Hospital Stay (HOSPITAL_BASED_OUTPATIENT_CLINIC_OR_DEPARTMENT_OTHER): Payer: Medicare Other | Admitting: Oncology

## 2019-07-17 ENCOUNTER — Inpatient Hospital Stay: Payer: Medicare Other | Attending: Oncology

## 2019-07-17 VITALS — BP 117/80 | HR 94 | Temp 98.7°F | Resp 18 | Ht 72.0 in | Wt 122.7 lb

## 2019-07-17 DIAGNOSIS — C49A3 Gastrointestinal stromal tumor of small intestine: Secondary | ICD-10-CM | POA: Diagnosis present

## 2019-07-17 DIAGNOSIS — C49A0 Gastrointestinal stromal tumor, unspecified site: Secondary | ICD-10-CM | POA: Diagnosis not present

## 2019-07-17 DIAGNOSIS — C787 Secondary malignant neoplasm of liver and intrahepatic bile duct: Secondary | ICD-10-CM | POA: Diagnosis not present

## 2019-07-17 DIAGNOSIS — Z170 Estrogen receptor positive status [ER+]: Secondary | ICD-10-CM | POA: Diagnosis not present

## 2019-07-17 DIAGNOSIS — E538 Deficiency of other specified B group vitamins: Secondary | ICD-10-CM | POA: Insufficient documentation

## 2019-07-17 DIAGNOSIS — Z853 Personal history of malignant neoplasm of breast: Secondary | ICD-10-CM | POA: Diagnosis not present

## 2019-07-17 DIAGNOSIS — Z79899 Other long term (current) drug therapy: Secondary | ICD-10-CM | POA: Insufficient documentation

## 2019-07-17 DIAGNOSIS — C649 Malignant neoplasm of unspecified kidney, except renal pelvis: Secondary | ICD-10-CM

## 2019-07-17 DIAGNOSIS — D509 Iron deficiency anemia, unspecified: Secondary | ICD-10-CM

## 2019-07-17 DIAGNOSIS — Z9223 Personal history of estrogen therapy: Secondary | ICD-10-CM | POA: Diagnosis not present

## 2019-07-17 DIAGNOSIS — K529 Noninfective gastroenteritis and colitis, unspecified: Secondary | ICD-10-CM | POA: Diagnosis not present

## 2019-07-17 LAB — CMP (CANCER CENTER ONLY)
ALT: 21 U/L (ref 0–44)
AST: 29 U/L (ref 15–41)
Albumin: 3.7 g/dL (ref 3.5–5.0)
Alkaline Phosphatase: 31 U/L — ABNORMAL LOW (ref 38–126)
Anion gap: 7 (ref 5–15)
BUN: 12 mg/dL (ref 8–23)
CO2: 30 mmol/L (ref 22–32)
Calcium: 8.7 mg/dL — ABNORMAL LOW (ref 8.9–10.3)
Chloride: 105 mmol/L (ref 98–111)
Creatinine: 1.24 mg/dL (ref 0.61–1.24)
GFR, Est AFR Am: >60 mL/min (ref >60)
GFR, Estimated: 59 mL/min — ABNORMAL LOW (ref >60)
Glucose, Bld: 100 mg/dL — ABNORMAL HIGH (ref 70–99)
Potassium: 3.7 mmol/L (ref 3.5–5.1)
Sodium: 142 mmol/L (ref 135–145)
Total Bilirubin: 1.5 mg/dL — ABNORMAL HIGH (ref 0.3–1.2)
Total Protein: 6.4 g/dL — ABNORMAL LOW (ref 6.5–8.1)

## 2019-07-17 LAB — CBC WITH DIFFERENTIAL (CANCER CENTER ONLY)
Abs Immature Granulocytes: 0.01 10*3/uL (ref 0.00–0.07)
Basophils Absolute: 0.1 10*3/uL (ref 0.0–0.1)
Basophils Relative: 1 %
Eosinophils Absolute: 0.1 10*3/uL (ref 0.0–0.5)
Eosinophils Relative: 1 %
HCT: 36.1 % — ABNORMAL LOW (ref 39.0–52.0)
Hemoglobin: 11.6 g/dL — ABNORMAL LOW (ref 13.0–17.0)
Immature Granulocytes: 0 %
Lymphocytes Relative: 33 %
Lymphs Abs: 2 10*3/uL (ref 0.7–4.0)
MCH: 30.5 pg (ref 26.0–34.0)
MCHC: 32.1 g/dL (ref 30.0–36.0)
MCV: 95 fL (ref 80.0–100.0)
Monocytes Absolute: 0.6 10*3/uL (ref 0.1–1.0)
Monocytes Relative: 9 %
Neutro Abs: 3.3 10*3/uL (ref 1.7–7.7)
Neutrophils Relative %: 56 %
Platelet Count: 272 10*3/uL (ref 150–400)
RBC: 3.8 MIL/uL — ABNORMAL LOW (ref 4.22–5.81)
RDW: 14.6 % (ref 11.5–15.5)
WBC Count: 6 10*3/uL (ref 4.0–10.5)
nRBC: 0 % (ref 0.0–0.2)

## 2019-07-17 LAB — VITAMIN B12: Vitamin B-12: 139 pg/mL — ABNORMAL LOW (ref 180–914)

## 2019-07-17 MED ORDER — CYANOCOBALAMIN 1000 MCG/ML IJ SOLN
INTRAMUSCULAR | Status: AC
  Filled 2019-07-17: qty 1

## 2019-07-17 MED ORDER — CYANOCOBALAMIN 1000 MCG/ML IJ SOLN
1000.0000 ug | Freq: Once | INTRAMUSCULAR | Status: AC
  Administered 2019-07-17: 15:00:00 1000 ug via INTRAMUSCULAR

## 2019-07-17 NOTE — Progress Notes (Signed)
Hematology and Oncology Follow Up Visit  Henry [name] [id number] Oct 03, 1951 68 y.o. 07/17/2019 2:32 PM [name], MDGennosa, [name], MD   Principle Diagnosis: 68 year old with  1. 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  He continues to be disease-free on imatinib.  2.  Breast cancer diagnosed in 2015.  He was found to T1N0 invasive ductal carcinoma that is ER/PR positive.  3.  Anemia related to iron deficiency as well as B12 deficiency.  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   He had recurrent GIST in late 2012 with further surgery by Dr.Levine March 2011. Gleevec started at 200 mg after that.   Surgery by Dr. Clovis Riley Sep 27, 2011.  The pathology showed recurrent GIST 8.9 cm with resection margins not involved.     He had stereotactic radiosurgery to the liver lesion at [facility] with a total 5000cGy in 5 fractions completed 03-18-15.   Tamoxifen 20 mg daily since 2015 completed 5 years of therapy and discontinued.   Current therapy:   Gleevec 200 mg daily     Vitamin B12 injection 1000 g every 30 days.   Interim History: [name] is here for a follow-up.  Since last visit, he reports no major changes in his health.  His diarrhea has resolved at this time his bowel habits are more regular but still has a pasty consistency to it.  He denies any watery bowel habits at this time.  He is gained his weight back and continues to feel reasonably well.  He denies any complications related to [place].  He denies any hospitalization or illnesses.  Patient denied any alteration mental status, neuropathy, confusion or dizziness.  Denies any headaches or lethargy.  Denies any night sweats, weight loss or changes in appetite.  Denied orthopnea, dyspnea on exertion or chest discomfort.  Denies shortness of  breath, difficulty breathing hemoptysis or cough.  Denies any abdominal distention, nausea, early satiety or dyspepsia.  Denies any hematuria, frequency, dysuria or nocturia.  Denies any skin irritation, dryness or rash.  Denies any ecchymosis or petechiae.  Denies any lymphadenopathy or clotting.  Denies any heat or cold intolerance.  Denies any anxiety or depression.  Remaining review of system is negative.        Medications: Updated on review. Current Outpatient Medications  Medication Sig Dispense Refill  . Cyanocobalamin (VITAMIN B-12 IJ) Inject 1,000 mcg as directed every 30 (thirty) days. Reported on 12/12/2015    . GLEEVEC 100 MG tablet TAKE 2 TABLETS BY MOUTH DAILY. TAKE WITH MEALS AND LARGE GLASS OF WATER (CAUTION:CHEMO) 90 tablet 6  . saccharomyces boulardii (FLORASTOR) 250 MG capsule Take 1 capsule (250 mg total) by mouth 2 (two) times daily. 60 capsule 1   No current facility-administered medications for this visit.      Allergies:  Allergies  Allergen Reactions  . Feraheme [Ferumoxytol] Hives    Past Medical History, Surgical history, Social history, and Family History without any changes on review.  Physical Exam:  Blood pressure 117/80, pulse 94, temperature 98.7 F (37.1 C), temperature source Oral, resp. rate 18, height 6' (1.829 m), weight 122 lb 11.2 oz (55.7 kg), SpO2 99 %.     ECOG: 1    General appearance: Alert, awake without any distress. Head: Atraumatic without abnormalities Oropharynx: Without any thrush or ulcers.  Eyes: No scleral icterus. Lymph nodes: No lymphadenopathy noted in the cervical, supraclavicular, or axillary nodes Heart:regular rate and rhythm, without any murmurs or gallops.   Lung: Clear to auscultation without any rhonchi, wheezes or dullness to percussion. Abdomin: Soft, nontender without any shifting dullness or ascites. Musculoskeletal: No clubbing or cyanosis. Neurological: No motor or sensory deficits. Skin: No rashes  or lesions.    Lab Results: Lab Results  Component Value Date   WBC 5.1 01/08/2019   HGB 14.3 01/08/2019   HCT 43.4 01/08/2019   MCV 96.2 01/08/2019   PLT 175 01/08/2019     Chemistry      Component Value Date/Time   NA 141 01/08/2019 1025   NA 142 07/09/2017 1336   K 3.3 (L) 01/08/2019 1025   K 3.7 07/09/2017 1336   CL 101 01/08/2019 1025   CL 105 04/13/2013 0922   CO2 28 01/08/2019 1025   CO2 27 07/09/2017 1336   BUN 13 01/08/2019 1025   BUN 13.4 07/09/2017 1336   CREATININE 1.12 01/08/2019 1025   CREATININE 1.1 07/09/2017 1336      Component Value Date/Time   CALCIUM 8.1 (L) 01/08/2019 1025   CALCIUM 8.7 07/09/2017 1336   ALKPHOS 23 (L) 01/08/2019 1025   ALKPHOS 25 (L) 07/09/2017 1336   AST 38 01/08/2019 1025   AST 16 07/09/2017 1336   ALT 21 01/08/2019 1025   ALT 8 07/09/2017 1336   BILITOT 1.0 01/08/2019 1025   BILITOT 1.00 07/09/2017 1336      Impression and Plan:  68 year old man with  1.  GIST of the small intestines with hepatic metastasis arising from the small intestines.  He is currently without any evidence of disease on maintenance imatinib since his diagnosis of 2007.    Imaging studies obtained in March 2020 did not show any evidence to suggest relapsed disease.  Risks and benefits of continuing imatinib was reviewed.  Given his high risk of relapse disease I recommended continuing this indefinitely.  Alternative treatment options in case of relapse were reviewed.  Different targeted agent such as sunitinib or cabozantinib may be a consideration.  2.  ER/PR positive stage I breast cancer diagnosed in 2015.  He continues to be disease-free at this time and completed 5 years of tamoxifen after surgical resection.  3.  Chronic diarrhea:    4.  Anemia: Related to iron deficiency as well as B12 deficiency.  His hemoglobin is declining slowly and has not been receiving his B12 injections regularly.  We will proceed with B12 injection today and he  will arrange to have that done monthly with his primary care physician.  5. Follow-up: In 6 months for repeat evaluation.  25  minutes was spent with the patient face-to-face today.  More than 50% of time was spent on reviewing his disease status, treatment options and answering questions regarding future plan of care.      Zola Button, MD 9/25/20202:32 PM

## 2019-07-20 ENCOUNTER — Telehealth: Payer: Self-pay | Admitting: Oncology

## 2019-07-20 LAB — IRON AND TIBC
Iron: 78 ug/dL (ref 42–163)
Saturation Ratios: 19 % — ABNORMAL LOW (ref 20–55)
TIBC: 415 ug/dL — ABNORMAL HIGH (ref 202–409)
UIBC: 338 ug/dL (ref 117–376)

## 2019-07-20 LAB — FERRITIN: Ferritin: 12 ng/mL — ABNORMAL LOW (ref 24–336)

## 2019-07-20 NOTE — Telephone Encounter (Signed)
Scheduled appt per 9/25 sch message.  Sent a message and a calendar will be mailed out.

## 2019-08-12 MED FILL — GLEEVEC 100 MG TABLET: 100 | 30 days supply | Qty: 60 | Fill #1

## 2019-09-14 MED FILL — GLEEVEC 100 MG TABLET: 100 | 30 days supply | Qty: 60 | Fill #2

## 2019-10-13 MED FILL — GLEEVEC 100 MG TABLET: 100 | 30 days supply | Qty: 60 | Fill #3

## 2019-11-11 MED FILL — GLEEVEC 100 MG TABLET: 100 | 30 days supply | Qty: 60 | Fill #4

## 2019-12-09 MED FILL — GLEEVEC 100 MG TABLET: 100 | 30 days supply | Qty: 60 | Fill #5

## 2020-01-14 ENCOUNTER — Inpatient Hospital Stay: Payer: Medicare Other | Attending: Oncology

## 2020-01-14 ENCOUNTER — Other Ambulatory Visit: Payer: Self-pay

## 2020-01-14 ENCOUNTER — Inpatient Hospital Stay (HOSPITAL_BASED_OUTPATIENT_CLINIC_OR_DEPARTMENT_OTHER): Payer: Medicare Other | Admitting: Oncology

## 2020-01-14 ENCOUNTER — Telehealth: Payer: Self-pay | Admitting: Oncology

## 2020-01-14 VITALS — BP 112/72 | HR 81 | Temp 98.7°F | Resp 16 | Ht 72.0 in | Wt 120.0 lb

## 2020-01-14 DIAGNOSIS — C49A0 Gastrointestinal stromal tumor, unspecified site: Secondary | ICD-10-CM | POA: Insufficient documentation

## 2020-01-14 DIAGNOSIS — Z9049 Acquired absence of other specified parts of digestive tract: Secondary | ICD-10-CM | POA: Diagnosis not present

## 2020-01-14 DIAGNOSIS — C787 Secondary malignant neoplasm of liver and intrahepatic bile duct: Secondary | ICD-10-CM | POA: Diagnosis not present

## 2020-01-14 DIAGNOSIS — E538 Deficiency of other specified B group vitamins: Secondary | ICD-10-CM | POA: Diagnosis not present

## 2020-01-14 DIAGNOSIS — D509 Iron deficiency anemia, unspecified: Secondary | ICD-10-CM

## 2020-01-14 DIAGNOSIS — Z79899 Other long term (current) drug therapy: Secondary | ICD-10-CM | POA: Diagnosis not present

## 2020-01-14 LAB — CMP (CANCER CENTER ONLY)
ALT: 12 U/L (ref 0–44)
AST: 20 U/L (ref 15–41)
Albumin: 3.7 g/dL (ref 3.5–5.0)
Alkaline Phosphatase: 33 U/L — ABNORMAL LOW (ref 38–126)
Anion gap: 12 (ref 5–15)
BUN: 14 mg/dL (ref 8–23)
CO2: 28 mmol/L (ref 22–32)
Calcium: 9.1 mg/dL (ref 8.9–10.3)
Chloride: 103 mmol/L (ref 98–111)
Creatinine: 1.28 mg/dL — ABNORMAL HIGH (ref 0.61–1.24)
GFR, Est AFR Am: >60 mL/min (ref >60)
GFR, Estimated: 57 mL/min — ABNORMAL LOW (ref >60)
Glucose, Bld: 135 mg/dL — ABNORMAL HIGH (ref 70–99)
Potassium: 3.1 mmol/L — ABNORMAL LOW (ref 3.5–5.1)
Sodium: 143 mmol/L (ref 135–145)
Total Bilirubin: 1.4 mg/dL — ABNORMAL HIGH (ref 0.3–1.2)
Total Protein: 7 g/dL (ref 6.5–8.1)

## 2020-01-14 LAB — CBC WITH DIFFERENTIAL (CANCER CENTER ONLY)
Abs Immature Granulocytes: 0.03 10*3/uL (ref 0.00–0.07)
Basophils Absolute: 0.1 10*3/uL (ref 0.0–0.1)
Basophils Relative: 1 %
Eosinophils Absolute: 0.1 10*3/uL (ref 0.0–0.5)
Eosinophils Relative: 1 %
HCT: 36.2 % — ABNORMAL LOW (ref 39.0–52.0)
Hemoglobin: 11.5 g/dL — ABNORMAL LOW (ref 13.0–17.0)
Immature Granulocytes: 0 %
Lymphocytes Relative: 33 %
Lymphs Abs: 2.5 10*3/uL (ref 0.7–4.0)
MCH: 28 pg (ref 26.0–34.0)
MCHC: 31.8 g/dL (ref 30.0–36.0)
MCV: 88.3 fL (ref 80.0–100.0)
Monocytes Absolute: 0.7 10*3/uL (ref 0.1–1.0)
Monocytes Relative: 10 %
Neutro Abs: 4.1 10*3/uL (ref 1.7–7.7)
Neutrophils Relative %: 55 %
Platelet Count: 409 10*3/uL — ABNORMAL HIGH (ref 150–400)
RBC: 4.1 MIL/uL — ABNORMAL LOW (ref 4.22–5.81)
RDW: 15.9 % — ABNORMAL HIGH (ref 11.5–15.5)
WBC Count: 7.5 10*3/uL (ref 4.0–10.5)
nRBC: 0 % (ref 0.0–0.2)

## 2020-01-14 LAB — VITAMIN B12: Vitamin B-12: 348 pg/mL (ref 180–914)

## 2020-01-14 MED FILL — GLEEVEC 100 MG TABLET: 100 | 30 days supply | Qty: 60 | Fill #6

## 2020-01-14 NOTE — Progress Notes (Signed)
Hematology and Oncology Follow Up Visit  Henry [name] [id number] 28-Mar-1951 69 y.o. 01/14/2020 3:20 PM [name], MDGennosa, [name], MD   Principle Diagnosis: 69 year old with  1.  Small intestinal GIST diagnosed in 2007 with metastatic disease to the liver.  Remains disease-free after surgical resection.  2.  T1N0 breast cancer diagnosed in 2015.    3.  Multifactorial anemia related to iron deficiency as well as B12 deficiency.  Prior Therapy: He is S/Psubtotal gastrectomy with Roux-en-Y in 2007 then briefly on study with [place] which he did not tolerate due to marked elevations in LFTs.   He had recurrent GIST in late 2012 with further surgery by Dr.Levine March 2011. Gleevec started at 200 mg after that.   Surgery by Dr. Clovis Riley Sep 27, 2011.  The pathology showed recurrent GIST 8.9 cm with resection margins not involved.     He had stereotactic radiosurgery to the liver lesion at [facility] with a total 5000cGy in 5 fractions completed 03-18-15.   Tamoxifen 20 mg daily since 2015 completed 5 years of therapy and discontinued.   Current therapy:   Gleevec 200 mg daily   Vitamin B12 injection 1000 g every 30 days.   Interim History: [name] is here for return evaluation.  Since the last visit, he reports no major changes in his health.  He denies any recent hospitalization or illnesses.  He denies any excessive fatigue or tiredness.  He did sustain an injury to his left shoulder while he was handling a horse.  He continues to tolerate complete VAC without any complications at this time.        Medications: Updated on review. Current Outpatient Medications  Medication Sig Dispense Refill  . Cyanocobalamin (VITAMIN B-12 IJ) Inject 1,000 mcg as directed every 30 (thirty) days. Reported on 12/12/2015    . GLEEVEC 100 MG tablet TAKE 2 TABLETS BY MOUTH DAILY. TAKE WITH MEALS AND LARGE GLASS OF WATER (CAUTION:CHEMO) 90 tablet 6  . saccharomyces boulardii  (FLORASTOR) 250 MG capsule Take 1 capsule (250 mg total) by mouth 2 (two) times daily. (Patient not taking: Reported on 01/14/2020) 60 capsule 1   No current facility-administered medications for this visit.     Allergies:  Allergies  Allergen Reactions  . Feraheme [Ferumoxytol] Hives   .  Physical Exam:  Blood pressure 112/72, pulse 81, temperature 98.7 F (37.1 C), temperature source Temporal, resp. rate 16, height 6' (1.829 m), weight 120 lb (54.4 kg), SpO2 99 %.     ECOG: 1    General appearance: Comfortable appearing without any discomfort Head: Normocephalic without any trauma Oropharynx: Mucous membranes are moist and pink without any thrush or ulcers. Eyes: Pupils are equal and round reactive to light. Lymph nodes: No cervical, supraclavicular, inguinal or axillary lymphadenopathy.   Heart:regular rate and rhythm.  S1 and S2 without leg edema. Lung: Clear without any rhonchi or wheezes.  No dullness to percussion. Abdomin: Soft, nontender, nondistended with good bowel sounds.  No hepatosplenomegaly. Musculoskeletal: No joint deformity or effusion.  Full range of motion noted. Neurological: No deficits noted on motor, sensory and deep tendon reflex exam. Skin: No petechial rash or dryness.  Appeared moist.  .     Lab Results: Lab Results  Component Value Date   WBC 7.5 01/14/2020   HGB 11.5 (L) 01/14/2020   HCT 36.2 (L) 01/14/2020   MCV 88.3 01/14/2020   PLT 409 (H) 01/14/2020     Chemistry  Component Value Date/Time   NA 142 07/17/2019 1424   NA 142 07/09/2017 1336   K 3.7 07/17/2019 1424   K 3.7 07/09/2017 1336   CL 105 07/17/2019 1424   CL 105 04/13/2013 0922   CO2 30 07/17/2019 1424   CO2 27 07/09/2017 1336   BUN 12 07/17/2019 1424   BUN 13.4 07/09/2017 1336   CREATININE 1.24 07/17/2019 1424   CREATININE 1.1 07/09/2017 1336      Component Value Date/Time   CALCIUM 8.7 (L) 07/17/2019 1424   CALCIUM 8.7 07/09/2017 1336   ALKPHOS 31 (L)  07/17/2019 1424   ALKPHOS 25 (L) 07/09/2017 1336   AST 29 07/17/2019 1424   AST 16 07/09/2017 1336   ALT 21 07/17/2019 1424   ALT 8 07/09/2017 1336   BILITOT 1.5 (H) 07/17/2019 1424   BILITOT 1.00 07/09/2017 1336      Impression and Plan:  69 year old man with  1.  Small intestinal GIST with metastatic disease to the liver diagnosed in 2007.     He continues to be on Gleevec without any major complications at this time.  Imaging studies and December 2020 completed at [facility] showed no disease relapse.  Risks and benefits of continuing [place] was reviewed today and he is agreeable to continue.  2.  Stage I breast cancer diagnosed in 2015.  He was found to have ER/PR positive and status post surgery and tamoxifen.  He remains up-to-date on surveillance without any evidence of relapsed disease at this time.  3.  Chronic diarrhea: Resolved at this time without any relapse.   4.  Anemia: Related to iron deficiency as well as B12 injection.  He continues to receive B12 injection locally with his primary care physician.  Iron studies are currently pending we will recommend iron replacement he has element of iron deficiency.  5. Follow-up: He will return in 6 months for follow-up.  30  minutes were dedicated to this visit. The time was spent on reviewing laboratory data, imaging studies, discussing treatment options,  and answering questions regarding future plan.       Zola Button, MD 3/25/20213:20 PM

## 2020-01-14 NOTE — Telephone Encounter (Signed)
Scheduled appt per 3/25 los.  Sent a message to HIM pool to get a calendar mailed out. 

## 2020-01-15 LAB — IRON AND TIBC
Iron: 59 ug/dL (ref 42–163)
Saturation Ratios: 12 % — ABNORMAL LOW (ref 20–55)
TIBC: 475 ug/dL — ABNORMAL HIGH (ref 202–409)
UIBC: 416 ug/dL — ABNORMAL HIGH (ref 117–376)

## 2020-01-15 LAB — FERRITIN: Ferritin: 13 ng/mL — ABNORMAL LOW (ref 24–336)

## 2020-02-11 MED FILL — GLEEVEC 100 MG TABLET: 100 | 30 days supply | Qty: 60 | Fill #7

## 2020-03-10 MED FILL — GLEEVEC 100 MG TABLET: 100 | 30 days supply | Qty: 60 | Fill #8

## 2020-04-14 MED FILL — GLEEVEC 100 MG TABLET: 100 | 30 days supply | Qty: 60 | Fill #9

## 2020-05-13 ENCOUNTER — Other Ambulatory Visit: Payer: Self-pay | Admitting: Oncology

## 2020-05-13 DIAGNOSIS — C49A2 Gastrointestinal stromal tumor of stomach: Secondary | ICD-10-CM

## 2020-05-18 MED FILL — GLEEVEC 100 MG TABLET: 100 | 30 days supply | Qty: 60 | Fill #0

## 2020-06-03 ENCOUNTER — Telehealth: Payer: Self-pay | Admitting: Oncology

## 2020-06-03 NOTE — Telephone Encounter (Signed)
Rescheduled 09/24 appointment to 09/15 per provider pal, patient has been called and notified.

## 2020-06-16 MED FILL — GLEEVEC 100 MG TABLET: 100 | 30 days supply | Qty: 60 | Fill #1

## 2020-07-06 ENCOUNTER — Other Ambulatory Visit: Payer: Self-pay

## 2020-07-06 ENCOUNTER — Inpatient Hospital Stay: Payer: Medicare Other | Attending: Oncology

## 2020-07-06 ENCOUNTER — Inpatient Hospital Stay (HOSPITAL_BASED_OUTPATIENT_CLINIC_OR_DEPARTMENT_OTHER): Payer: Medicare Other | Admitting: Oncology

## 2020-07-06 VITALS — BP 130/70 | HR 74 | Temp 97.2°F | Resp 18 | Ht 72.0 in | Wt 121.7 lb

## 2020-07-06 DIAGNOSIS — D509 Iron deficiency anemia, unspecified: Secondary | ICD-10-CM | POA: Insufficient documentation

## 2020-07-06 DIAGNOSIS — Z170 Estrogen receptor positive status [ER+]: Secondary | ICD-10-CM | POA: Diagnosis not present

## 2020-07-06 DIAGNOSIS — Z9221 Personal history of antineoplastic chemotherapy: Secondary | ICD-10-CM | POA: Diagnosis not present

## 2020-07-06 DIAGNOSIS — Z853 Personal history of malignant neoplasm of breast: Secondary | ICD-10-CM | POA: Diagnosis not present

## 2020-07-06 DIAGNOSIS — Z8505 Personal history of malignant neoplasm of liver: Secondary | ICD-10-CM | POA: Diagnosis not present

## 2020-07-06 DIAGNOSIS — Z9223 Personal history of estrogen therapy: Secondary | ICD-10-CM | POA: Diagnosis not present

## 2020-07-06 DIAGNOSIS — Z8509 Personal history of malignant neoplasm of other digestive organs: Secondary | ICD-10-CM | POA: Insufficient documentation

## 2020-07-06 DIAGNOSIS — C50929 Malignant neoplasm of unspecified site of unspecified male breast: Secondary | ICD-10-CM | POA: Insufficient documentation

## 2020-07-06 DIAGNOSIS — E538 Deficiency of other specified B group vitamins: Secondary | ICD-10-CM | POA: Insufficient documentation

## 2020-07-06 LAB — CBC WITH DIFFERENTIAL (CANCER CENTER ONLY)
Abs Immature Granulocytes: 0.01 10*3/uL (ref 0.00–0.07)
Basophils Absolute: 0.1 10*3/uL (ref 0.0–0.1)
Basophils Relative: 2 %
Eosinophils Absolute: 0.1 10*3/uL (ref 0.0–0.5)
Eosinophils Relative: 3 %
HCT: 32.1 % — ABNORMAL LOW (ref 39.0–52.0)
Hemoglobin: 10.1 g/dL — ABNORMAL LOW (ref 13.0–17.0)
Immature Granulocytes: 0 %
Lymphocytes Relative: 40 %
Lymphs Abs: 1.7 10*3/uL (ref 0.7–4.0)
MCH: 26.6 pg (ref 26.0–34.0)
MCHC: 31.5 g/dL (ref 30.0–36.0)
MCV: 84.5 fL (ref 80.0–100.0)
Monocytes Absolute: 0.5 10*3/uL (ref 0.1–1.0)
Monocytes Relative: 11 %
Neutro Abs: 1.9 10*3/uL (ref 1.7–7.7)
Neutrophils Relative %: 44 %
Platelet Count: 352 10*3/uL (ref 150–400)
RBC: 3.8 MIL/uL — ABNORMAL LOW (ref 4.22–5.81)
RDW: 18.5 % — ABNORMAL HIGH (ref 11.5–15.5)
WBC Count: 4.4 10*3/uL (ref 4.0–10.5)
nRBC: 0 % (ref 0.0–0.2)

## 2020-07-06 LAB — CMP (CANCER CENTER ONLY)
ALT: 9 U/L (ref 0–44)
AST: 17 U/L (ref 15–41)
Albumin: 3.4 g/dL — ABNORMAL LOW (ref 3.5–5.0)
Alkaline Phosphatase: 32 U/L — ABNORMAL LOW (ref 38–126)
Anion gap: 6 (ref 5–15)
BUN: 14 mg/dL (ref 8–23)
CO2: 29 mmol/L (ref 22–32)
Calcium: 8.8 mg/dL — ABNORMAL LOW (ref 8.9–10.3)
Chloride: 107 mmol/L (ref 98–111)
Creatinine: 1.26 mg/dL — ABNORMAL HIGH (ref 0.61–1.24)
GFR, Est AFR Am: >60 mL/min (ref >60)
GFR, Estimated: 58 mL/min — ABNORMAL LOW (ref >60)
Glucose, Bld: 100 mg/dL — ABNORMAL HIGH (ref 70–99)
Potassium: 3.6 mmol/L (ref 3.5–5.1)
Sodium: 142 mmol/L (ref 135–145)
Total Bilirubin: 1.1 mg/dL (ref 0.3–1.2)
Total Protein: 6.8 g/dL (ref 6.5–8.1)

## 2020-07-06 LAB — IRON AND TIBC
Iron: 24 ug/dL — ABNORMAL LOW (ref 42–163)
Saturation Ratios: 5 % — ABNORMAL LOW (ref 20–55)
TIBC: 456 ug/dL — ABNORMAL HIGH (ref 202–409)
UIBC: 432 ug/dL — ABNORMAL HIGH (ref 117–376)

## 2020-07-06 LAB — FERRITIN: Ferritin: 9 ng/mL — ABNORMAL LOW (ref 24–336)

## 2020-07-06 LAB — VITAMIN B12: Vitamin B-12: 233 pg/mL (ref 180–914)

## 2020-07-06 NOTE — Progress Notes (Signed)
Hematology and Oncology Follow Up Visit  Henry [name] [id number] 1951-07-14 69 y.o. 07/06/2020 2:53 PM Henry [name], MDGennosa, Henry Her, MD   Principle Diagnosis: 69 year old with  1.  Gastrointestinal stromal tumor arising from the small intestine diagnosed in 2007.  He remains disease-free at this time.  2.  Stage I breast cancer diagnosed in 2015.  He presented with T1N0 disease without any evidence of relapse.  3.  Iron deficiency anemia diagnosed since 2007.  He has required supplements since that time.  Prior Therapy: He is S/Psubtotal gastrectomy with Roux-en-Y in 2007 then briefly on study with [place] which he did not tolerate due to marked elevations in LFTs.   He had recurrent GIST in late 2012 with further surgery by Dr.Levine March 2011. Gleevec started at 200 mg after that.   Surgery by Dr. Clovis Riley Sep 27, 2011.  The pathology showed recurrent GIST 8.9 cm with resection margins not involved.     He had stereotactic radiosurgery to the liver lesion at [facility] with a total 5000cGy in 5 fractions completed 03-18-15.   Tamoxifen 20 mg daily since 2015 completed 5 years of therapy and discontinued.   Current therapy:   Gleevec 200 mg daily   Vitamin B12 injection 1000 g every 30 days.   Interim History: [name] returns today for a follow-up visit.  He reports no major changes in his health.  He denies any abdominal pain, hematochezia or melena.  He denies any recent hospitalization or illnesses.  He denies any complications related to [place].  Quality of life remained excellent.        Medications: Updated on review. Current Outpatient Medications  Medication Sig Dispense Refill  . Cyanocobalamin (VITAMIN B-12 IJ) Inject 1,000 mcg as directed every 30 (thirty) days. Reported on 12/12/2015    . GLEEVEC 100 MG tablet TAKE 2 TABLETS BY MOUTH DAILY. TAKE WITH MEALS AND LARGE GLASS OF WATER (CAUTION:CHEMO) 60 tablet 6  . saccharomyces boulardii  (FLORASTOR) 250 MG capsule Take 1 capsule (250 mg total) by mouth 2 (two) times daily. (Patient not taking: Reported on 01/14/2020) 60 capsule 1   No current facility-administered medications for this visit.     Allergies:  Allergies  Allergen Reactions  . Feraheme [Ferumoxytol] Hives   .  Physical Exam:   Blood pressure 130/70, pulse 74, temperature (!) 97.2 F (36.2 C), temperature source Tympanic, resp. rate 18, height 6' (1.829 m), weight 121 lb 11.2 oz (55.2 kg), SpO2 100 %.     ECOG: 1     General appearance: Alert, awake without any distress. Head: Atraumatic without abnormalities Oropharynx: Without any thrush or ulcers. Eyes: No scleral icterus. Lymph nodes: No lymphadenopathy noted in the cervical, supraclavicular, or axillary nodes Heart:regular rate and rhythm, without any murmurs or gallops.   Lung: Clear to auscultation without any rhonchi, wheezes or dullness to percussion. Abdomin: Soft, nontender without any shifting dullness or ascites. Musculoskeletal: No clubbing or cyanosis. Neurological: No motor or sensory deficits. Skin: No rashes or lesions.   .     Lab Results: Lab Results  Component Value Date   WBC 4.4 07/06/2020   HGB 10.1 (L) 07/06/2020   HCT 32.1 (L) 07/06/2020   MCV 84.5 07/06/2020   PLT 352 07/06/2020     Chemistry      Component Value Date/Time   NA 143 01/14/2020 1451   NA 142 07/09/2017 1336   K 3.1 (L) 01/14/2020 1451   K 3.7 07/09/2017 1336  CL 103 01/14/2020 1451   CL 105 04/13/2013 0922   CO2 28 01/14/2020 1451   CO2 27 07/09/2017 1336   BUN 14 01/14/2020 1451   BUN 13.4 07/09/2017 1336   CREATININE 1.28 (H) 01/14/2020 1451   CREATININE 1.1 07/09/2017 1336      Component Value Date/Time   CALCIUM 9.1 01/14/2020 1451   CALCIUM 8.7 07/09/2017 1336   ALKPHOS 33 (L) 01/14/2020 1451   ALKPHOS 25 (L) 07/09/2017 1336   AST 20 01/14/2020 1451   AST 16 07/09/2017 1336   ALT 12 01/14/2020 1451   ALT 8  07/09/2017 1336   BILITOT 1.4 (H) 01/14/2020 1451   BILITOT 1.00 07/09/2017 1336      Impression and Plan:  69 year old man with  1.  Gastrointestinal stromal tumor diagnosed in 2007.  He was found to have metastatic disease to the liver arising from small intestinal primary.    He continues to be disease-free without any evidence of relapse currently on Gleevec.  He has remained active follow-up and imaging studies at with [facility].  Different salvage therapy option may be needed if he develops relapsed disease.  2.  Breast cancer diagnosed in 2015.  He was found to have stage I disease that is ER, PR positive.  Continues to have evidence of relapse at this time after completing adjuvant chemotherapy.    3.  Iron deficiency anemia: This has been chronic in nature and he is scheduled to have GI work-up for repeat evaluation.  Risks and benefits of intravenous iron infusion were discussed.  He has been on oral iron therapy that has been ineffective.  Complication associated with IV iron infusion include arthralgias, myalgias and rarely anaphylaxis.  He is agreeable to proceed at this time and will premedicate him prior to his IV iron infusion.  Laboratory data from today reviewed showed worsening hemoglobin at 10.1 and has been declining since March 2020.  Iron studies in March 2021 showed ferritin and saturation levels increased with borderline iron levels.  He is will be updated today likely will show further decline.  4. Follow-up: In the near future to have IV iron infusion and in 6 months for repeat follow-up.  30  minutes were spent on this encounter.  Time was dedicated to updating his disease status, reviewing laboratory data and outlining future plan of care.       Zola Button, MD 9/15/20212:53 PM

## 2020-07-13 MED FILL — IMATINIB MESYLATE 100 MG TA: 100 | 30 days supply | Qty: 60 | Fill #2

## 2020-07-15 ENCOUNTER — Ambulatory Visit: Payer: Medicare Other | Admitting: Oncology

## 2020-07-15 ENCOUNTER — Other Ambulatory Visit: Payer: Medicare Other

## 2020-07-27 ENCOUNTER — Inpatient Hospital Stay: Payer: Medicare Other | Attending: Oncology

## 2020-07-27 ENCOUNTER — Other Ambulatory Visit: Payer: Self-pay

## 2020-07-27 VITALS — BP 162/79 | HR 66 | Temp 97.7°F | Resp 16

## 2020-07-27 DIAGNOSIS — D509 Iron deficiency anemia, unspecified: Secondary | ICD-10-CM | POA: Diagnosis not present

## 2020-07-27 DIAGNOSIS — E538 Deficiency of other specified B group vitamins: Secondary | ICD-10-CM

## 2020-07-27 MED ORDER — METHYLPREDNISOLONE SODIUM SUCC 125 MG IJ SOLR
125.0000 mg | Freq: Once | INTRAMUSCULAR | Status: AC
  Administered 2020-07-27: 125 mg via INTRAVENOUS

## 2020-07-27 MED ORDER — SODIUM CHLORIDE 0.9 % IV SOLN
510.0000 mg | Freq: Once | INTRAVENOUS | Status: AC
  Administered 2020-07-27: 510 mg via INTRAVENOUS
  Filled 2020-07-27: qty 510

## 2020-07-27 MED ORDER — SODIUM CHLORIDE 0.9 % IV SOLN
INTRAVENOUS | Status: DC
  Filled 2020-07-27: qty 250

## 2020-07-27 MED ORDER — ACETAMINOPHEN 325 MG PO TABS
650.0000 mg | ORAL_TABLET | Freq: Once | ORAL | Status: AC
  Administered 2020-07-27: 650 mg via ORAL

## 2020-07-27 MED ORDER — DIPHENHYDRAMINE HCL 25 MG PO CAPS
50.0000 mg | ORAL_CAPSULE | Freq: Once | ORAL | Status: AC
  Administered 2020-07-27: 50 mg via ORAL

## 2020-07-27 MED ORDER — ACETAMINOPHEN 325 MG PO TABS
ORAL_TABLET | ORAL | Status: AC
  Filled 2020-07-27: qty 2

## 2020-07-27 MED ORDER — METHYLPREDNISOLONE SODIUM SUCC 125 MG IJ SOLR
INTRAMUSCULAR | Status: AC
  Filled 2020-07-27: qty 2

## 2020-07-27 MED ORDER — DIPHENHYDRAMINE HCL 25 MG PO CAPS
ORAL_CAPSULE | ORAL | Status: AC
  Filled 2020-07-27: qty 2

## 2020-07-27 NOTE — Patient Instructions (Signed)

## 2020-08-02 ENCOUNTER — Encounter: Payer: Self-pay | Admitting: Internal Medicine

## 2020-08-03 ENCOUNTER — Inpatient Hospital Stay: Payer: Medicare Other

## 2020-08-03 ENCOUNTER — Other Ambulatory Visit: Payer: Self-pay

## 2020-08-03 VITALS — BP 119/65 | HR 73 | Temp 97.7°F | Resp 20

## 2020-08-03 DIAGNOSIS — D509 Iron deficiency anemia, unspecified: Secondary | ICD-10-CM

## 2020-08-03 DIAGNOSIS — E538 Deficiency of other specified B group vitamins: Secondary | ICD-10-CM

## 2020-08-03 MED ORDER — SODIUM CHLORIDE 0.9 % IV SOLN
INTRAVENOUS | Status: DC
  Filled 2020-08-03: qty 250

## 2020-08-03 MED ORDER — ACETAMINOPHEN 325 MG PO TABS
650.0000 mg | ORAL_TABLET | Freq: Once | ORAL | Status: AC
  Administered 2020-08-03: 650 mg via ORAL

## 2020-08-03 MED ORDER — DIPHENHYDRAMINE HCL 25 MG PO CAPS
50.0000 mg | ORAL_CAPSULE | Freq: Once | ORAL | Status: AC
  Administered 2020-08-03: 50 mg via ORAL

## 2020-08-03 MED ORDER — DIPHENHYDRAMINE HCL 25 MG PO CAPS
ORAL_CAPSULE | ORAL | Status: AC
  Filled 2020-08-03: qty 2

## 2020-08-03 MED ORDER — METHYLPREDNISOLONE SODIUM SUCC 125 MG IJ SOLR
125.0000 mg | Freq: Once | INTRAMUSCULAR | Status: AC
  Administered 2020-08-03: 125 mg via INTRAVENOUS

## 2020-08-03 MED ORDER — SODIUM CHLORIDE 0.9 % IV SOLN
510.0000 mg | Freq: Once | INTRAVENOUS | Status: AC
  Administered 2020-08-03: 510 mg via INTRAVENOUS
  Filled 2020-08-03: qty 510

## 2020-08-03 MED ORDER — ACETAMINOPHEN 325 MG PO TABS
ORAL_TABLET | ORAL | Status: AC
  Filled 2020-08-03: qty 2

## 2020-08-03 MED ORDER — METHYLPREDNISOLONE SODIUM SUCC 125 MG IJ SOLR
INTRAMUSCULAR | Status: AC
  Filled 2020-08-03: qty 2

## 2020-08-03 NOTE — Patient Instructions (Signed)

## 2020-08-18 MED FILL — IMATINIB MESYLATE 100 MG TA: 100 | 30 days supply | Qty: 60 | Fill #3

## 2020-09-06 NOTE — Progress Notes (Signed)
)    09/06/2020 TATUM MASSMAN [id number] Jan 14, 1951   Chief Complaint: Schedule an EGD and colonoscopy  History of Present Illness: Henry [name]. 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 He had recurrent GIST in 2011 and 2012 which required further surgery and he was treated with a lower dose of Gleevac 2012 -2015.  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 Stage I breast cancer diagnosed in 2015 on Tamoxifen x 5 years. Renal cancer in 2009 s/p right kidney mass excision, no chemo.  He is followed by oncologist Dr.Shadad and Dr. Kendall Flack at [facility]. Other past surgical history includes appendectomy, bilateral inguinal hernia repair and splenectomy.   He was last seen in our office by Nicoletta Ba PA-C for further evaluation for diarrhea and weight loss.  He stated his diarrhea stopped after he took Florastor for few weeks and he gained back the previously lost weight.  He stated his weight was as low as 102 pounds and he currently remains between 121 to 125 pounds.   He presents to our office today as referred by Jerl Santos PA-C to schedule an surveillance EGD and a colonoscopy.  He has chronic iron deficiency anemia.  He denies having any dysphagia.  He has heartburn once weekly or less for which he takes Tums with relief.  He denies having any upper abdominal pain.  He is passing a medium brown semisolid will daily and occasionally has a loose stool.  No melena.  He rarely sees a tiny amount of cherry red blood on the toilet tissue.  Appetite is good.  His weight is stable as noted above.  He remains on Glee VAC 200 mg daily.  He takes  ferrous sulfate 325 mg daily.  He is allergic to IV iron but Dr. Alen Blew recommended premedicating prior to further IV infusions in the near future.   His last EGD was 12/28/2005 when his GIST was initially diagnosed.  He underwent an EUS by Dr. Ardis Hughs on 11/03/2009 which showed a 5.6 solid mass that was intimately associated with the gastric remnant wall from his prior gastrectomy in 2007. Cytology showed recurrence of neoplastic cells and he underwent further surgical excision in 2011 and 2012 as noted above. He denies having any further upper endoscopic evaluation since then.  He underwent a colonoscopy 06/18/2002 which was normal.  Records from his PCP document a colonoscopy in 2010, however, I do not have access to these results if done. No family history of colon cancer.  His mother had breast and ovarian cancer.   Laboratory studies 07/06/2020: Iron 24.  Iron saturation 5.  Ferritin 9.   CBC Latest Ref Rng & Units 07/06/2020 01/14/2020 07/17/2019  WBC 4.0 - 10.5 K/uL 4.4 7.5 6.0  Hemoglobin 13.0 - 17.0 g/dL 10.1(L) 11.5(L) 11.6(L)  Hematocrit 39 - 52 % 32.1(L) 36.2(L) 36.1(L)  Platelets 150 - 400 K/uL 352 409(H) 272    CMP Latest Ref Rng & Units 07/06/2020 01/14/2020 07/17/2019  Glucose 70 - 99 mg/dL 100(H) 135(H) 100(H)  BUN 8 - 23 mg/dL 14 14 12   Creatinine 0.61 - 1.24 mg/dL 1.26(H) 1.28(H) 1.24  Sodium 135 - 145 mmol/L 142 143 142  Potassium 3.5 -  5.1 mmol/L 3.6 3.1(L) 3.7  Chloride 98 - 111 mmol/L 107 103 105  CO2 22 - 32 mmol/L 29 28 30   Calcium 8.9 - 10.3 mg/dL 8.8(L) 9.1 8.7(L)  Total Protein 6.5 - 8.1 g/dL 6.8 7.0 6.4(L)  Total Bilirubin 0.3 - 1.2 mg/dL 1.1 1.4(H) 1.5(H)  Alkaline Phos 38 - 126 U/L 32(L) 33(L) 31(L)  AST 15 - 41 U/L 17 20 29   ALT 0 - 44 U/L 9 12 21     CTAP with contrast 01/12/2019: 1. No definite evidence of recurrent or metastatic carcinoma within the chest, abdomen or pelvis. 2. Multiple ill-defined areas of ground-glass opacity throughout right lung, which are new  since previous study. These may be due to inflammatory or infectious etiology, with low-grade adenocarcinoma considered much less likely given multifocality. Recommend follow-up by chest CT in 6 months.  3. Slight increase in size right middle lobe pulmonary nodules measuring 5 mm or less. Recommend continued attention on follow-up CT. 4. 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 Recommend continued follow-up by MRI or CT in 1 year   Current Outpatient Medications on File Prior to Visit  Medication Sig Dispense Refill   Cyanocobalamin (VITAMIN B-12 IJ) Inject 1,000 mcg as directed every 30 (thirty) days. Reported on 12/12/2015     ferrous sulfate 325 (65 FE) MG tablet Take 325 mg by mouth daily.     GLEEVEC 100 MG tablet TAKE 2 TABLETS BY MOUTH DAILY. TAKE WITH MEALS AND LARGE GLASS OF WATER (CAUTION:CHEMO) 60 tablet 6   Iron-Vitamin C (VITRON-C PO) Take by mouth.     VITAMIN D PO Take by mouth. 5,000 3 x a week     No current facility-administered medications on file prior to visit.   Allergies  Allergen Reactions   Feraheme [Ferumoxytol] Hives     Current Medications, Allergies, Past Medical History, Past Surgical History, Family History and Social History were reviewed in [employer] record.   Review of Systems:   Constitutional: Negative for fever, sweats, chills or weight loss.  Respiratory: Negative for shortness of breath.   Cardiovascular: Negative for chest pain, palpitations and leg swelling.  Gastrointestinal: See HPI.  Musculoskeletal: + back pain.  Neurological: Negative for dizziness, headaches or paresthesias.    Physical Exam: BP 108/64    Pulse (!) 55    Ht 6' (1.829 m)    Wt 120 lb 12.8 oz (54.8 kg)    BMI 16.38 kg/m   General: Thin 69 year old male in no acute distress. Head: Normocephalic and atraumatic. Eyes: No scleral icterus. Conjunctiva pink  . Ears: Normal auditory acuity. Mouth: Poor dentition.  No ulcers or lesions.  Lungs: Clear throughout to auscultation. Heart: Regular rate and rhythm, no murmur. Abdomen: Soft, nontender and nondistended. No masses or hepatomegaly. Normal bowel sounds x 4 quadrants.  Midline scar intact. Rectal: Deferred. Musculoskeletal: Symmetrical with no gross deformities. Extremities: No edema. Neurological: Alert oriented x 4. No focal deficits.  Psychological: Alert and cooperative. Normal mood and affect  Assessment and Recommendations:  41. 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 Iron 24. No overt GI bleeding, rarely sees red blood on the toile tissue. Received IV Ferumoxytol  07/27/2020 and  08/03/2020.  -EGD and colonoscopy benefits and risks discussed including risk with sedation, risk of bleeding, perforation and infection  -Further recommendations to be determined after EGD and colonoscopy completed  2. History of breast cancer 2015  3. History of renal cancer   4.  Pancreatic lesion. 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  Await further recommendations from Dr. Henrene Pastor regarding repeat imaging as well.

## 2020-09-07 ENCOUNTER — Encounter: Payer: Self-pay | Admitting: Nurse Practitioner

## 2020-09-07 ENCOUNTER — Ambulatory Visit (INDEPENDENT_AMBULATORY_CARE_PROVIDER_SITE_OTHER): Payer: Medicare Other | Admitting: Nurse Practitioner

## 2020-09-07 VITALS — BP 108/64 | HR 55 | Ht 72.0 in | Wt 120.8 lb

## 2020-09-07 DIAGNOSIS — Z8509 Personal history of malignant neoplasm of other digestive organs: Secondary | ICD-10-CM | POA: Diagnosis not present

## 2020-09-07 DIAGNOSIS — Z1211 Encounter for screening for malignant neoplasm of colon: Secondary | ICD-10-CM

## 2020-09-07 DIAGNOSIS — D509 Iron deficiency anemia, unspecified: Secondary | ICD-10-CM

## 2020-09-07 MED ORDER — SUTAB 1479-225-188 MG PO TABS: 1.0000 | ORAL_TABLET | Freq: Once | ORAL | 0 refills | Status: AC

## 2020-09-07 NOTE — Patient Instructions (Signed)
If you are age 69 or older, your body mass index should be between 23-30. Your Body mass index is 16.38 kg/m. If this is out of the aforementioned range listed, please consider follow up with your Primary Care Provider.  If you are age 61 or younger, your body mass index should be between 19-25. Your Body mass index is 16.38 kg/m. If this is out of the aformentioned range listed, please consider follow up with your Primary Care Provider.    You have been scheduled for an endoscopy and colonoscopy. Please follow the written instructions given to you at your visit today. Please pick up your prep supplies at the pharmacy within the next 1-3 days. If you use inhalers (even only as needed), please bring them with you on the day of your procedure.

## 2020-09-08 NOTE — Progress Notes (Signed)
Noted  

## 2020-09-19 MED FILL — IMATINIB MESYLATE 100 MG TA: 100 | 30 days supply | Qty: 60 | Fill #4

## 2020-09-28 ENCOUNTER — Encounter: Payer: Medicare Other | Admitting: Internal Medicine

## 2020-10-04 ENCOUNTER — Ambulatory Visit: Payer: Medicare Other | Admitting: Internal Medicine

## 2020-10-12 MED FILL — IMATINIB MESYLATE 100 MG TA: 100 | 30 days supply | Qty: 60 | Fill #5

## 2020-10-28 ENCOUNTER — Ambulatory Visit (AMBULATORY_SURGERY_CENTER): Payer: Medicare Other | Admitting: Internal Medicine

## 2020-10-28 ENCOUNTER — Other Ambulatory Visit: Payer: Self-pay

## 2020-10-28 ENCOUNTER — Encounter: Payer: Self-pay | Admitting: Internal Medicine

## 2020-10-28 VITALS — BP 160/103 | HR 64 | Temp 97.3°F | Resp 15 | Ht 72.0 in | Wt 120.0 lb

## 2020-10-28 DIAGNOSIS — D509 Iron deficiency anemia, unspecified: Secondary | ICD-10-CM

## 2020-10-28 DIAGNOSIS — K2289 Other specified disease of esophagus: Secondary | ICD-10-CM | POA: Diagnosis not present

## 2020-10-28 DIAGNOSIS — Z8509 Personal history of malignant neoplasm of other digestive organs: Secondary | ICD-10-CM

## 2020-10-28 DIAGNOSIS — K648 Other hemorrhoids: Secondary | ICD-10-CM

## 2020-10-28 DIAGNOSIS — K219 Gastro-esophageal reflux disease without esophagitis: Secondary | ICD-10-CM

## 2020-10-28 DIAGNOSIS — Z1211 Encounter for screening for malignant neoplasm of colon: Secondary | ICD-10-CM

## 2020-10-28 MED ORDER — SODIUM CHLORIDE 0.9 % IV SOLN: 500.0000 mL | Freq: Once | INTRAVENOUS | Status: DC

## 2020-10-28 NOTE — Op Note (Signed)
[place] Patient Name: Henry [name] Procedure Date: 10/28/2020 8:33 AM MRN: [id number] Endoscopist: Docia Chuck. Henrene Pastor , MD Age: 70 Referring MD:  Date of Birth: 1951-09-22 Gender: Male Account #: [id number] Procedure:                Upper GI endoscopy with biopsies Indications:              Iron deficiency anemia Medicines:                Monitored Anesthesia Care Procedure:                Pre-Anesthesia Assessment:                           - Prior to the procedure, a History and Physical                            was performed, and patient medications and                            allergies were reviewed. The patient's tolerance of                            previous anesthesia was also reviewed. The risks                            and benefits of the procedure and the sedation                            options and risks were discussed with the patient.                            All questions were answered, and informed consent                            was obtained. Prior Anticoagulants: The patient has                            taken no previous anticoagulant or antiplatelet                            agents. ASA Grade Assessment: III - A patient with                            severe systemic disease. After reviewing the risks                            and benefits, the patient was deemed in                            satisfactory condition to undergo the procedure.                           After obtaining informed consent, the endoscope was  passed under direct vision. Throughout the                            procedure, the patient's blood pressure, pulse, and                            oxygen saturations were monitored continuously. The                            Endoscope was introduced through the mouth, and                            advanced to the afferent and efferent jejunal                            loops. The upper GI  endoscopy was accomplished                            without difficulty. The patient tolerated the                            procedure well. Scope In: Scope Out: Findings:                 The esophagus revealed mild distal esophagitis as                            manifested by mucosal friability.. Biopsies were                            taken with a cold forceps for histology.                           The stomach revealed evidence of subtotal                            gastrectomy with Billroth II type anatomy. No                            mucosal abnormalities.                           The examined afferret loop was normal.                           The examined efferret loop was normal. Complications:            No immediate complications. Estimated Blood Loss:     Estimated blood loss: none. Impression:               1. Mild distal esophagitis                           2. Billroth II type gastric anatomy                           3. No evidence of tumor  or other significant                            mucosal abnormalities                           4. Iron deficiency anemia secondary to poor iron                            absorption due to altered upper GI anatomy. Recommendation:           - Patient has a contact number available for                            emergencies. The signs and symptoms of potential                            delayed complications were discussed with the                            patient. Return to normal activities tomorrow.                            Written discharge instructions were provided to the                            patient.                           - Resume previous diet.                           - Continue present medications.                           - Await pathology results.                           - Return to the care of your oncologist who can                            direct iron replacement therapy to avoid                             progressive anemia Docia Chuck. Henrene Pastor, MD 10/28/2020 9:35:12 AM This report has been signed electronically.

## 2020-10-28 NOTE — Progress Notes (Signed)
Called to room to assist during endoscopic procedure.  Patient ID and intended procedure confirmed with present staff. Received instructions for my participation in the procedure from the performing physician.  

## 2020-10-28 NOTE — Progress Notes (Signed)
Medical history reviewed with no changes noted. VS assessed by S.M 

## 2020-10-28 NOTE — Progress Notes (Signed)
PT taken to PACU. Monitors in place. VSS. Report given to RN. 

## 2020-10-28 NOTE — Patient Instructions (Signed)
Internal Hemorrhoids (handout given) Repeat colonoscopy in 10 years.  Esophagitis (handout given)  YOU HAD AN ENDOSCOPIC PROCEDURE TODAY AT [facility]:   Refer to the procedure report that was given to you for any specific questions about what was found during the examination.  If the procedure report does not answer your questions, please call your gastroenterologist to clarify.  If you requested that your care partner not be given the details of your procedure findings, then the procedure report has been included in a sealed envelope for you to review at your convenience later.  YOU SHOULD EXPECT: Some feelings of bloating in the abdomen. Passage of more gas than usual.  Walking can help get rid of the air that was put into your GI tract during the procedure and reduce the bloating. If you had a lower endoscopy (such as a colonoscopy or flexible sigmoidoscopy) you may notice spotting of blood in your stool or on the toilet paper. If you underwent a bowel prep for your procedure, you may not have a normal bowel movement for a few days.  Please Note:  You might notice some irritation and congestion in your nose or some drainage.  This is from the oxygen used during your procedure.  There is no need for concern and it should clear up in a day or so.  SYMPTOMS TO REPORT IMMEDIATELY:   Following lower endoscopy (colonoscopy or flexible sigmoidoscopy):  Excessive amounts of blood in the stool  Significant tenderness or worsening of abdominal pains  Swelling of the abdomen that is new, acute  Fever of 100F or higher   Following upper endoscopy (EGD)  Vomiting of blood or coffee ground material  New chest pain or pain under the shoulder blades  Painful or persistently difficult swallowing  New shortness of breath  Fever of 100F or higher  Black, tarry-looking stools  For urgent or emergent issues, a gastroenterologist can be reached at any hour by calling [phone]  [phone]. Do not use MyChart messaging for urgent concerns.    DIET:  We do recommend a small meal at first, but then you may proceed to your regular diet.  Drink plenty of fluids but you should avoid alcoholic beverages for 24 hours.  ACTIVITY:  You should plan to take it easy for the rest of today and you should NOT DRIVE or use heavy machinery until tomorrow (because of the sedation medicines used during the test).    FOLLOW UP: Our staff will call the number listed on your records 48-72 hours following your procedure to check on you and address any questions or concerns that you may have regarding the information given to you following your procedure. If we do not reach you, we will leave a message.  We will attempt to reach you two times.  During this call, we will ask if you have developed any symptoms of COVID 19. If you develop any symptoms (ie: fever, flu-like symptoms, shortness of breath, cough etc.) before then, please call [phone].  If you test positive for Covid 19 in the 2 weeks post procedure, please call and report this information to [place].    If any biopsies were taken you will be contacted by phone or by letter within the next 1-3 weeks.  Please call us at [phone] if you have not heard about the biopsies in 3 weeks.    SIGNATURES/CONFIDENTIALITY: You and/or your care partner have signed paperwork which will be entered into your electronic medical  record.  These signatures attest to the fact that that the information above on your After Visit Summary has been reviewed and is understood.  Full responsibility of the confidentiality of this discharge information lies with you and/or your care-partner. 

## 2020-10-28 NOTE — Op Note (Signed)
Henry [name] Patient Name: Tavone Caesar Procedure Date: 10/28/2020 8:39 AM MRN: [id number] Endoscopist: Docia Chuck. Henrene Pastor , MD Age: 70 Referring MD:  Date of Birth: 01-29-1951 Gender: Male Account #: [id number] Procedure:                Colonoscopy Indications:              Colon cancer screening. Greater than 10 years since                            last colonoscopy. Chronic iron deficiency anemia                            felt to be secondary to altered upper GI anatomy                            and associated malabsorption Medicines:                Monitored Anesthesia Care Procedure:                Pre-Anesthesia Assessment:                           - Prior to the procedure, a History and Physical                            was performed, and patient medications and                            allergies were reviewed. The patient's tolerance of                            previous anesthesia was also reviewed. The risks                            and benefits of the procedure and the sedation                            options and risks were discussed with the patient.                            All questions were answered, and informed consent                            was obtained. Prior Anticoagulants: The patient has                            taken no previous anticoagulant or antiplatelet                            agents. ASA Grade Assessment: III - A patient with                            severe systemic disease. After reviewing the risks  and benefits, the patient was deemed in                            satisfactory condition to undergo the procedure.                           After obtaining informed consent, the colonoscope                            was passed under direct vision. Throughout the                            procedure, the patient's blood pressure, pulse, and                            oxygen saturations were  monitored continuously. The                            Olympus CF-HQ190L [id number]) Colonoscope was                            introduced through the anus and advanced to the the                            cecum, identified by appendiceal orifice and                            ileocecal valve. The ileocecal valve, appendiceal                            orifice, and rectum were photographed. The quality                            of the bowel preparation was good. The colonoscopy                            was performed without difficulty. The patient                            tolerated the procedure well. The bowel preparation                            used was SUPREP via split dose instruction. Scope In: 8:47:25 AM Scope Out: 9:05:06 AM Scope Withdrawal Time: 0 hours 10 minutes 39 seconds  Total Procedure Duration: 0 hours 17 minutes 41 seconds  Findings:                 Internal hemorrhoids were found during                            retroflexion. The hemorrhoids were moderate.                           The exam was otherwise without abnormality on  direct and retroflexion views. Complications:            No immediate complications. Estimated blood loss:                            None. Estimated Blood Loss:     Estimated blood loss: none. Impression:               - Internal hemorrhoids.                           - The examination was otherwise normal on direct                            and retroflexion views.                           - No specimens collected. Recommendation:           - Repeat colonoscopy in 10 years for screening                            purposes.                           - Patient has a contact number available for                            emergencies. The signs and symptoms of potential                            delayed complications were discussed with the                            patient. Return to normal activities  tomorrow.                            Written discharge instructions were provided to the                            patient.                           - Resume previous diet.                           - Continue present medications.                           - EGD today. Please see report Docia Chuck. Henrene Pastor, MD 10/28/2020 9:27:13 AM This report has been signed electronically.

## 2020-11-01 ENCOUNTER — Telehealth: Payer: Self-pay | Admitting: *Deleted

## 2020-11-01 NOTE — Telephone Encounter (Signed)
  Follow up Call-  Call back number 10/28/2020  Post procedure Call Back phone  # [phone]  Permission to leave phone message Yes  Some recent data might be hidden     Patient questions:  Do you have a fever, pain , or abdominal swelling? No. Pain Score  0 *  Have you tolerated food without any problems? Yes.    Have you been able to return to your normal activities? Yes.    Do you have any questions about your discharge instructions: Diet   No. Medications  No. Follow up visit  No.  Do you have questions or concerns about your Care? Yes.    Actions: * If pain score is 4 or above: No action needed, pain <4.  1. Have you developed a fever since your procedure? no  2.   Have you had an respiratory symptoms (SOB or cough) since your procedure? no  3.   Have you tested positive for COVID 19 since your procedure no  4.   Have you had any family members/close contacts diagnosed with the COVID 19 since your procedure?  no   If yes to any of these questions please route to Joylene John, RN and Joella Prince, RN

## 2020-11-02 ENCOUNTER — Encounter: Payer: Self-pay | Admitting: Internal Medicine

## 2020-11-17 MED FILL — IMATINIB MESYLATE 100 MG TA: 100 | 30 days supply | Qty: 60 | Fill #6

## 2020-12-19 ENCOUNTER — Other Ambulatory Visit: Payer: Self-pay | Admitting: Oncology

## 2020-12-19 DIAGNOSIS — C49A2 Gastrointestinal stromal tumor of stomach: Secondary | ICD-10-CM

## 2020-12-19 MED FILL — IMATINIB MESYLATE 100 MG TA: 100 | 30 days supply | Qty: 60 | Fill #0

## 2020-12-20 ENCOUNTER — Telehealth: Payer: Self-pay | Admitting: Oncology

## 2020-12-20 NOTE — Telephone Encounter (Signed)
Scheduled appt per 2/28 sch msg - pt is aware of apt added

## 2021-01-06 ENCOUNTER — Inpatient Hospital Stay: Payer: Medicare Other | Attending: Oncology | Admitting: Oncology

## 2021-01-06 ENCOUNTER — Inpatient Hospital Stay: Payer: Medicare Other

## 2021-01-06 ENCOUNTER — Other Ambulatory Visit: Payer: Self-pay

## 2021-01-06 ENCOUNTER — Other Ambulatory Visit: Payer: Self-pay | Admitting: Oncology

## 2021-01-06 VITALS — BP 134/77 | HR 81 | Temp 97.4°F | Resp 18 | Wt 128.2 lb

## 2021-01-06 DIAGNOSIS — Z853 Personal history of malignant neoplasm of breast: Secondary | ICD-10-CM | POA: Diagnosis not present

## 2021-01-06 DIAGNOSIS — D509 Iron deficiency anemia, unspecified: Secondary | ICD-10-CM

## 2021-01-06 DIAGNOSIS — C49A0 Gastrointestinal stromal tumor, unspecified site: Secondary | ICD-10-CM

## 2021-01-06 DIAGNOSIS — E538 Deficiency of other specified B group vitamins: Secondary | ICD-10-CM

## 2021-01-06 DIAGNOSIS — Z79899 Other long term (current) drug therapy: Secondary | ICD-10-CM | POA: Insufficient documentation

## 2021-01-06 DIAGNOSIS — C49A3 Gastrointestinal stromal tumor of small intestine: Secondary | ICD-10-CM | POA: Diagnosis not present

## 2021-01-06 DIAGNOSIS — Z9229 Personal history of other drug therapy: Secondary | ICD-10-CM | POA: Insufficient documentation

## 2021-01-06 DIAGNOSIS — C787 Secondary malignant neoplasm of liver and intrahepatic bile duct: Secondary | ICD-10-CM | POA: Diagnosis not present

## 2021-01-06 LAB — CBC WITH DIFFERENTIAL (CANCER CENTER ONLY)
Abs Immature Granulocytes: 0 10*3/uL (ref 0.00–0.07)
Basophils Absolute: 0.1 10*3/uL (ref 0.0–0.1)
Basophils Relative: 1 %
Eosinophils Absolute: 0.1 10*3/uL (ref 0.0–0.5)
Eosinophils Relative: 2 %
HCT: 38.1 % — ABNORMAL LOW (ref 39.0–52.0)
Hemoglobin: 12.2 g/dL — ABNORMAL LOW (ref 13.0–17.0)
Immature Granulocytes: 0 %
Lymphocytes Relative: 37 %
Lymphs Abs: 1.9 10*3/uL (ref 0.7–4.0)
MCH: 31.6 pg (ref 26.0–34.0)
MCHC: 32 g/dL (ref 30.0–36.0)
MCV: 98.7 fL (ref 80.0–100.0)
Monocytes Absolute: 0.6 10*3/uL (ref 0.1–1.0)
Monocytes Relative: 12 %
Neutro Abs: 2.4 10*3/uL (ref 1.7–7.7)
Neutrophils Relative %: 48 %
Platelet Count: 251 10*3/uL (ref 150–400)
RBC: 3.86 MIL/uL — ABNORMAL LOW (ref 4.22–5.81)
RDW: 14.6 % (ref 11.5–15.5)
WBC Count: 5.1 10*3/uL (ref 4.0–10.5)
nRBC: 0 % (ref 0.0–0.2)

## 2021-01-06 LAB — CMP (CANCER CENTER ONLY)
ALT: 12 U/L (ref 0–44)
AST: 16 U/L (ref 15–41)
Albumin: 3.4 g/dL — ABNORMAL LOW (ref 3.5–5.0)
Alkaline Phosphatase: 29 U/L — ABNORMAL LOW (ref 38–126)
Anion gap: 6 (ref 5–15)
BUN: 9 mg/dL (ref 8–23)
CO2: 27 mmol/L (ref 22–32)
Calcium: 8.3 mg/dL — ABNORMAL LOW (ref 8.9–10.3)
Chloride: 108 mmol/L (ref 98–111)
Creatinine: 1.1 mg/dL (ref 0.61–1.24)
GFR, Estimated: >60 mL/min (ref >60)
Glucose, Bld: 140 mg/dL — ABNORMAL HIGH (ref 70–99)
Potassium: 3.6 mmol/L (ref 3.5–5.1)
Sodium: 141 mmol/L (ref 135–145)
Total Bilirubin: 1.3 mg/dL — ABNORMAL HIGH (ref 0.3–1.2)
Total Protein: 6.1 g/dL — ABNORMAL LOW (ref 6.5–8.1)

## 2021-01-06 LAB — FERRITIN: Ferritin: 44 ng/mL (ref 24–336)

## 2021-01-06 LAB — IRON AND TIBC
Iron: 143 ug/dL (ref 42–163)
Saturation Ratios: 43 % (ref 20–55)
TIBC: 336 ug/dL (ref 202–409)
UIBC: 193 ug/dL (ref 117–376)

## 2021-01-06 NOTE — Progress Notes (Signed)
Hematology and Oncology Follow Up Visit  Henry [name] [id number] 22-Nov-1950 70 y.o. 01/06/2021 2:40 PM [name], MDGennosa, [name], MD   Principle Diagnosis: 70 year old man with  1.  Small intestinal GIST diagnosed in 2007.  He developed advanced disease with hepatic involvement and currently in remission.  2.  T1 N0 , stage I breast cancer diagnosed in 2015.  He is status post surgical resection with evidence of relapse.  3.  Iron deficiency anemia related to chronic GI losses and poor absorption noted in 2007.  Prior Therapy: He is S/Psubtotal gastrectomy with Roux-en-Y in 2007 then briefly on study with [place] which he did not tolerate due to marked elevations in LFTs.   He had recurrent GIST in late 2012 with further surgery by Dr.Levine March 2011. Gleevec started at 200 mg after that.   Surgery by Dr. Clovis Riley Sep 27, 2011.  The pathology showed recurrent GIST 8.9 cm with resection margins not involved.     He had stereotactic radiosurgery to the liver lesion at [facility] with a total 5000cGy in 5 fractions completed 03-18-15.   Tamoxifen 20 mg daily since 2015 completed 5 years of therapy and discontinued.  Status post intravenous iron infusion given in October 2021.   Current therapy:   Gleevec 200 mg daily with dose reduction after hepatic toxicity with 400 mg dosing in 2007.  Vitamin B12 injection 1000 g every 30 days.   Interim History: Henry [name] is here for a follow-up visit.  Since the last visit, she received intravenous iron infusion without any major complications.  He has reported excellent tolerance and improvement in his performance status and activity level.  He continues to tolerate Gleevec without any major complications.       Medications: Reviewed without changes. Current Outpatient Medications  Medication Sig Dispense Refill  . Cyanocobalamin (VITAMIN B-12 IJ) Inject 1,000 mcg as directed every 30 (thirty) days. Reported on  12/12/2015    . ferrous sulfate 325 (65 FE) MG tablet Take 325 mg by mouth daily.    Marland Kitchen imatinib (GLEEVEC) 100 MG tablet TAKE 2 TABLETS BY MOUTH DAILY. TAKE WITH MEALS AND LARGE GLASS OF WATER (CAUTION:CHEMO) 60 tablet 6  . Iron-Vitamin C (VITRON-C PO) Take by mouth.    Marland Kitchen VITAMIN D PO Take by mouth. 5,000 3 x a week     No current facility-administered medications for this visit.     Allergies:  Allergies  Allergen Reactions  . Feraheme [Ferumoxytol] Hives   .  Physical Exam:    Blood pressure 134/77, pulse 81, temperature (!) 97.4 F (36.3 C), temperature source Tympanic, resp. rate 18, weight 128 lb 3.2 oz (58.2 kg), SpO2 98 %.     ECOG: 1   General appearance: Comfortable appearing without any discomfort Head: Normocephalic without any trauma Oropharynx: Mucous membranes are moist and pink without any thrush or ulcers. Eyes: Pupils are equal and round reactive to light. Lymph nodes: No cervical, supraclavicular, inguinal or axillary lymphadenopathy.   Heart:regular rate and rhythm.  S1 and S2 without leg edema. Lung: Clear without any rhonchi or wheezes.  No dullness to percussion. Abdomin: Soft, nontender, nondistended with good bowel sounds.  No hepatosplenomegaly. Musculoskeletal: No joint deformity or effusion.  Full range of motion noted. Neurological: No deficits noted on motor, sensory and deep tendon reflex exam. Skin: No petechial rash or dryness.  Appeared moist.     .     Lab Results: Lab Results  Component Value Date  WBC 4.4 07/06/2020   HGB 10.1 (L) 07/06/2020   HCT 32.1 (L) 07/06/2020   MCV 84.5 07/06/2020   PLT 352 07/06/2020     Chemistry      Component Value Date/Time   NA 142 07/06/2020 1439   NA 142 07/09/2017 1336   K 3.6 07/06/2020 1439   K 3.7 07/09/2017 1336   CL 107 07/06/2020 1439   CL 105 04/13/2013 0922   CO2 29 07/06/2020 1439   CO2 27 07/09/2017 1336   BUN 14 07/06/2020 1439   BUN 13.4 07/09/2017 1336   CREATININE  1.26 (H) 07/06/2020 1439   CREATININE 1.1 07/09/2017 1336      Component Value Date/Time   CALCIUM 8.8 (L) 07/06/2020 1439   CALCIUM 8.7 07/09/2017 1336   ALKPHOS 32 (L) 07/06/2020 1439   ALKPHOS 25 (L) 07/09/2017 1336   AST 17 07/06/2020 1439   AST 16 07/09/2017 1336   ALT 9 07/06/2020 1439   ALT 8 07/09/2017 1336   BILITOT 1.1 07/06/2020 1439   BILITOT 1.00 07/09/2017 1336      Impression and Plan:  70 year old man with  1.  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    He is status post therapy outlined above and currently remains in remission.  He is underwent surgical resection and currently on maintenance Gleevec at a reduced dose for better tolerance.  The natural course of this disease was reviewed and risks and benefits of continuing this treatment were discussed.  Imaging studies and December 2021 include an MRI of the liver obtained at [facility] showed no evidence of disease relapse.    After discussion today he is agreeable to continue at this time.  Continues to follow with Dr. Kendall Flack as well at [facility].   2.  Stage I breast cancer diagnosed in 2015.  He completed surgical resection and adjuvant tamoxifen without any evidence of relapse.  3.  Iron deficiency anemia: Related to chronic GI losses and poor oral oral iron absorption.  He had completed intravenous iron infusion without any major complications.  GI work-up has been negative for any malignancy.  Laboratory data from today reviewed and showed improvement in his hemoglobin and iron studies are currently pending.  I recommended continuing oral iron replacements.   4. Follow-up: He will return in 6 months for a follow-up.   30  minutes were dedicated to this visit.  The time was spent on reviewing disease status, reviewing laboratory data, and answering questions regarding future plan of care.      Zola Button, MD 3/18/20222:40  PM

## 2021-01-17 ENCOUNTER — Other Ambulatory Visit (HOSPITAL_COMMUNITY): Payer: Self-pay

## 2021-01-17 MED FILL — IMATINIB MESYLATE 100 MG TA: 100 | 30 days supply | Qty: 60 | Fill #1

## 2021-02-13 ENCOUNTER — Other Ambulatory Visit (HOSPITAL_COMMUNITY): Payer: Self-pay

## 2021-02-13 MED FILL — Imatinib Mesylate Tab 100 MG (Base Equivalent): ORAL | 30 days supply | Qty: 60 | Fill #0 | Status: AC

## 2021-02-16 ENCOUNTER — Other Ambulatory Visit (HOSPITAL_COMMUNITY): Payer: Self-pay

## 2021-03-16 ENCOUNTER — Encounter: Payer: Self-pay | Admitting: Oncology

## 2021-03-16 ENCOUNTER — Other Ambulatory Visit (HOSPITAL_COMMUNITY): Payer: Self-pay

## 2021-03-16 MED FILL — Imatinib Mesylate Tab 100 MG (Base Equivalent): ORAL | 30 days supply | Qty: 60 | Fill #1 | Status: AC

## 2021-03-21 ENCOUNTER — Other Ambulatory Visit (HOSPITAL_COMMUNITY): Payer: Self-pay

## 2021-03-21 ENCOUNTER — Encounter: Payer: Self-pay | Admitting: Oncology

## 2021-04-14 ENCOUNTER — Other Ambulatory Visit (HOSPITAL_COMMUNITY): Payer: Self-pay

## 2021-04-14 MED FILL — Imatinib Mesylate Tab 100 MG (Base Equivalent): ORAL | 30 days supply | Qty: 60 | Fill #2 | Status: AC

## 2021-05-10 ENCOUNTER — Encounter: Payer: Self-pay | Admitting: Oncology

## 2021-05-10 ENCOUNTER — Other Ambulatory Visit (HOSPITAL_COMMUNITY): Payer: Self-pay

## 2021-05-10 MED FILL — Imatinib Mesylate Tab 100 MG (Base Equivalent): ORAL | 30 days supply | Qty: 60 | Fill #3 | Status: AC

## 2021-05-11 ENCOUNTER — Other Ambulatory Visit (HOSPITAL_COMMUNITY): Payer: Self-pay

## 2021-06-13 ENCOUNTER — Other Ambulatory Visit (HOSPITAL_COMMUNITY): Payer: Self-pay

## 2021-06-14 ENCOUNTER — Encounter: Payer: Self-pay | Admitting: Oncology

## 2021-06-14 ENCOUNTER — Other Ambulatory Visit (HOSPITAL_COMMUNITY): Payer: Self-pay

## 2021-06-14 MED FILL — Imatinib Mesylate Tab 100 MG (Base Equivalent): ORAL | 30 days supply | Qty: 60 | Fill #4 | Status: CN

## 2021-06-14 MED FILL — Imatinib Mesylate Tab 100 MG (Base Equivalent): ORAL | 30 days supply | Qty: 60 | Fill #4 | Status: AC

## 2021-07-07 ENCOUNTER — Other Ambulatory Visit (HOSPITAL_COMMUNITY): Payer: Self-pay

## 2021-07-07 ENCOUNTER — Other Ambulatory Visit: Payer: Self-pay | Admitting: Oncology

## 2021-07-07 DIAGNOSIS — C49A2 Gastrointestinal stromal tumor of stomach: Secondary | ICD-10-CM

## 2021-07-07 MED ORDER — IMATINIB MESYLATE 100 MG PO TABS
ORAL_TABLET | ORAL | 6 refills | Status: DC
  Filled 2021-07-17: qty 60, 30d supply, fill #0
  Filled 2021-08-11: qty 60, 30d supply, fill #1
  Filled 2021-09-13: qty 60, 30d supply, fill #2
  Filled 2021-10-12: qty 60, 30d supply, fill #3
  Filled 2021-11-14 (×2): qty 60, 30d supply, fill #4
  Filled 2021-12-08: qty 60, 30d supply, fill #5
  Filled 2022-01-05: qty 60, 30d supply, fill #6

## 2021-07-12 ENCOUNTER — Inpatient Hospital Stay (HOSPITAL_BASED_OUTPATIENT_CLINIC_OR_DEPARTMENT_OTHER): Payer: Medicare Other | Admitting: Oncology

## 2021-07-12 ENCOUNTER — Inpatient Hospital Stay: Payer: Medicare Other | Attending: Oncology

## 2021-07-12 ENCOUNTER — Other Ambulatory Visit: Payer: Self-pay

## 2021-07-12 VITALS — BP 129/78 | HR 61 | Temp 97.8°F | Resp 16 | Ht 72.0 in | Wt 120.0 lb

## 2021-07-12 DIAGNOSIS — E538 Deficiency of other specified B group vitamins: Secondary | ICD-10-CM | POA: Diagnosis not present

## 2021-07-12 DIAGNOSIS — C787 Secondary malignant neoplasm of liver and intrahepatic bile duct: Secondary | ICD-10-CM | POA: Diagnosis not present

## 2021-07-12 DIAGNOSIS — D509 Iron deficiency anemia, unspecified: Secondary | ICD-10-CM | POA: Diagnosis not present

## 2021-07-12 DIAGNOSIS — C49A0 Gastrointestinal stromal tumor, unspecified site: Secondary | ICD-10-CM

## 2021-07-12 DIAGNOSIS — Z853 Personal history of malignant neoplasm of breast: Secondary | ICD-10-CM | POA: Diagnosis not present

## 2021-07-12 LAB — CMP (CANCER CENTER ONLY)
ALT: 10 U/L (ref 0–44)
AST: 20 U/L (ref 15–41)
Albumin: 3.7 g/dL (ref 3.5–5.0)
Alkaline Phosphatase: 30 U/L — ABNORMAL LOW (ref 38–126)
Anion gap: 9 (ref 5–15)
BUN: 13 mg/dL (ref 8–23)
CO2: 27 mmol/L (ref 22–32)
Calcium: 8.9 mg/dL (ref 8.9–10.3)
Chloride: 107 mmol/L (ref 98–111)
Creatinine: 1.02 mg/dL (ref 0.61–1.24)
GFR, Estimated: >60 mL/min (ref >60)
Glucose, Bld: 93 mg/dL (ref 70–99)
Potassium: 3.7 mmol/L (ref 3.5–5.1)
Sodium: 143 mmol/L (ref 135–145)
Total Bilirubin: 1.3 mg/dL — ABNORMAL HIGH (ref 0.3–1.2)
Total Protein: 6.8 g/dL (ref 6.5–8.1)

## 2021-07-12 LAB — CBC WITH DIFFERENTIAL (CANCER CENTER ONLY)
Abs Immature Granulocytes: 0.02 10*3/uL (ref 0.00–0.07)
Basophils Absolute: 0 10*3/uL (ref 0.0–0.1)
Basophils Relative: 1 %
Eosinophils Absolute: 0.1 10*3/uL (ref 0.0–0.5)
Eosinophils Relative: 1 %
HCT: 38.7 % — ABNORMAL LOW (ref 39.0–52.0)
Hemoglobin: 12.7 g/dL — ABNORMAL LOW (ref 13.0–17.0)
Immature Granulocytes: 0 %
Lymphocytes Relative: 23 %
Lymphs Abs: 1.4 10*3/uL (ref 0.7–4.0)
MCH: 31.4 pg (ref 26.0–34.0)
MCHC: 32.8 g/dL (ref 30.0–36.0)
MCV: 95.8 fL (ref 80.0–100.0)
Monocytes Absolute: 0.6 10*3/uL (ref 0.1–1.0)
Monocytes Relative: 10 %
Neutro Abs: 3.7 10*3/uL (ref 1.7–7.7)
Neutrophils Relative %: 65 %
Platelet Count: 248 10*3/uL (ref 150–400)
RBC: 4.04 MIL/uL — ABNORMAL LOW (ref 4.22–5.81)
RDW: 14.4 % (ref 11.5–15.5)
WBC Count: 5.8 10*3/uL (ref 4.0–10.5)
nRBC: 0 % (ref 0.0–0.2)

## 2021-07-12 LAB — IRON AND TIBC
Iron: 106 ug/dL (ref 42–163)
Saturation Ratios: 29 % (ref 20–55)
TIBC: 363 ug/dL (ref 202–409)
UIBC: 256 ug/dL (ref 117–376)

## 2021-07-12 LAB — FERRITIN: Ferritin: 26 ng/mL (ref 24–336)

## 2021-07-12 MED ORDER — VITAMIN B-12 100 MCG PO TABS
100.0000 ug | ORAL_TABLET | Freq: Every day | ORAL | 6 refills | Status: AC
Start: 2021-07-12 — End: ?

## 2021-07-12 NOTE — Progress Notes (Signed)
Hematology and Oncology Follow Up Visit  Henry [name] [id number] [birth year] 70 y.o. 07/12/2021 10:11 AM [name], MDGennosa, [name], MD   Principle Diagnosis: 70 year old man with  1.  Gastrointestinal stromal tumor diagnosed in 2007.  He developed hepatic metastasis subsequently.    2.  Breast cancer diagnosed in 2015.  He was found to have T1 N0 , stage I disease.  3.  Iron deficiency anemia related to chronic GI losses and poor absorption noted in 2007.  Prior Therapy: He is S/Psubtotal gastrectomy with Roux-en-Y in 2007 then briefly on study with [place] which he did not tolerate due to marked elevations in LFTs.   He had recurrent GIST in late 2012 with further surgery by Dr. Clovis Riley March 2011. Gleevec started at 200 mg after that.   Surgery by Dr. Clovis Riley Sep 27, 2011.  The pathology showed recurrent GIST 8.9 cm with resection margins not involved.     He had stereotactic radiosurgery to the liver lesion at [facility] with a total 5000cGy in 5 fractions completed 03-18-15.   Tamoxifen 20 mg daily since 2015 completed 5 years of therapy and discontinued.  Status post intravenous iron infusion given in October 2021.   Current therapy:   Gleevec 200 mg daily since 2007.  His dose was reduced due to hepatic toxicity.   Vitamin B12 injection 1000 g every 30 days.  He has not been receiving that consistently.  Interim History: [name] presents today for repeat evaluation.  Since last visit, he reports no major changes in his health.  He denies any recent hospitalizations or illnesses.  He denies any abdominal pain or discomfort.  He has reported some fatigue and tiredness.  His appetite is reasonable although he lost weight because of inconsistent dietary habits.       Medications: Updated on review. Current Outpatient Medications  Medication Sig Dispense Refill   Cyanocobalamin (VITAMIN B-12 IJ) Inject 1,000 mcg as directed every 30 (thirty) days.  Reported on 12/12/2015     ferrous sulfate 325 (65 FE) MG tablet Take 325 mg by mouth daily.     imatinib (GLEEVEC) 100 MG tablet TAKE 2 TABLETS BY MOUTH DAILY. TAKE WITH MEALS AND LARGE GLASS OF WATER (CAUTION:CHEMO) 60 tablet 6   Iron-Vitamin C (VITRON-C PO) Take by mouth.     VITAMIN D PO Take by mouth. 5,000 3 x a week     No current facility-administered medications for this visit.     Allergies:  Allergies  Allergen Reactions   Feraheme [Ferumoxytol] Hives   .  Physical Exam:         ECOG: 1    General appearance: Alert, awake without any distress. Head: Atraumatic without abnormalities Oropharynx: Without any thrush or ulcers. Eyes: No scleral icterus. Lymph nodes: No lymphadenopathy noted in the cervical, supraclavicular, or axillary nodes Heart:regular rate and rhythm, without any murmurs or gallops.   Lung: Clear to auscultation without any rhonchi, wheezes or dullness to percussion. Abdomin: Soft, nontender without any shifting dullness or ascites. Musculoskeletal: No clubbing or cyanosis. Neurological: No motor or sensory deficits. Skin: No rashes or lesions.     .     Lab Results: Lab Results  Component Value Date   WBC 5.8 07/12/2021   HGB 12.7 (L) 07/12/2021   HCT 38.7 (L) 07/12/2021   MCV 95.8 07/12/2021   PLT 248 07/12/2021     Chemistry      Component Value Date/Time   NA 141 01/06/2021  1448   NA 142 07/09/2017 1336   K 3.6 01/06/2021 1448   K 3.7 07/09/2017 1336   CL 108 01/06/2021 1448   CL 105 04/13/2013 0922   CO2 27 01/06/2021 1448   CO2 27 07/09/2017 1336   BUN 9 01/06/2021 1448   BUN 13.4 07/09/2017 1336   CREATININE 1.10 01/06/2021 1448   CREATININE 1.1 07/09/2017 1336      Component Value Date/Time   CALCIUM 8.3 (L) 01/06/2021 1448   CALCIUM 8.7 07/09/2017 1336   ALKPHOS 29 (L) 01/06/2021 1448   ALKPHOS 25 (L) 07/09/2017 1336   AST 16 01/06/2021 1448   AST 16 07/09/2017 1336   ALT 12 01/06/2021 1448   ALT 8  07/09/2017 1336   BILITOT 1.3 (H) 01/06/2021 1448   BILITOT 1.00 07/09/2017 1336      Impression and Plan:  69 year old man with  1. Intestinal GIST diagnosed in 2007 with hepatic metastasis.   He remains on [place] which she has tolerated very well without any major complaints.  Imaging studies obtained in June 2022 showed no progression of disease.  He continues to follow [place] with Dr. Kendall Flack regarding this issue.    2.  Breast cancer diagnosed in 2015.  He presented with stage I without any additional treatment needed to be on surgical resection and 5 years of tamoxifen.  3.  Anemia: Related to iron deficiency and B12 deficiency.  His hemoglobin is improving with iron level currently pending.  He has reported some fatigue and tiredness with history of B12 deficiency.  We will update his levels and replace as needed.  4. Follow-up: In 6 months for repeat follow-up.   30  minutes were spent on this encounter.  The time was dedicated to reviewing laboratory data, disease status update, treatment choices and future plan of care discussion.        Zola Button, MD 9/21/202210:11 AM

## 2021-07-17 ENCOUNTER — Other Ambulatory Visit (HOSPITAL_COMMUNITY): Payer: Self-pay

## 2021-07-17 ENCOUNTER — Encounter: Payer: Self-pay | Admitting: Oncology

## 2021-08-11 ENCOUNTER — Encounter: Payer: Self-pay | Admitting: Oncology

## 2021-08-11 ENCOUNTER — Other Ambulatory Visit (HOSPITAL_COMMUNITY): Payer: Self-pay

## 2021-08-17 ENCOUNTER — Other Ambulatory Visit (HOSPITAL_COMMUNITY): Payer: Self-pay

## 2021-08-17 ENCOUNTER — Encounter: Payer: Self-pay | Admitting: Oncology

## 2021-09-12 ENCOUNTER — Other Ambulatory Visit (HOSPITAL_COMMUNITY): Payer: Self-pay

## 2021-09-13 ENCOUNTER — Other Ambulatory Visit (HOSPITAL_COMMUNITY): Payer: Self-pay

## 2021-09-13 ENCOUNTER — Encounter: Payer: Self-pay | Admitting: Oncology

## 2021-09-19 ENCOUNTER — Other Ambulatory Visit (HOSPITAL_COMMUNITY): Payer: Self-pay

## 2021-09-19 ENCOUNTER — Encounter: Payer: Self-pay | Admitting: Oncology

## 2021-10-12 ENCOUNTER — Encounter: Payer: Self-pay | Admitting: Oncology

## 2021-10-12 ENCOUNTER — Other Ambulatory Visit (HOSPITAL_COMMUNITY): Payer: Self-pay

## 2021-10-20 ENCOUNTER — Encounter: Payer: Self-pay | Admitting: Oncology

## 2021-10-20 ENCOUNTER — Other Ambulatory Visit (HOSPITAL_COMMUNITY): Payer: Self-pay

## 2021-11-14 ENCOUNTER — Other Ambulatory Visit (HOSPITAL_COMMUNITY): Payer: Self-pay

## 2021-11-14 ENCOUNTER — Encounter: Payer: Self-pay | Admitting: Oncology

## 2021-12-08 ENCOUNTER — Other Ambulatory Visit (HOSPITAL_COMMUNITY): Payer: Self-pay

## 2021-12-08 ENCOUNTER — Encounter: Payer: Self-pay | Admitting: Oncology

## 2021-12-14 ENCOUNTER — Encounter: Payer: Self-pay | Admitting: Oncology

## 2021-12-14 ENCOUNTER — Other Ambulatory Visit (HOSPITAL_COMMUNITY): Payer: Self-pay

## 2022-01-05 ENCOUNTER — Other Ambulatory Visit (HOSPITAL_COMMUNITY): Payer: Self-pay

## 2022-01-05 ENCOUNTER — Encounter: Payer: Self-pay | Admitting: Oncology

## 2022-01-10 ENCOUNTER — Inpatient Hospital Stay (HOSPITAL_BASED_OUTPATIENT_CLINIC_OR_DEPARTMENT_OTHER): Payer: Medicare Other | Admitting: Oncology

## 2022-01-10 ENCOUNTER — Other Ambulatory Visit: Payer: Self-pay

## 2022-01-10 ENCOUNTER — Inpatient Hospital Stay: Payer: Medicare Other | Attending: Oncology

## 2022-01-10 VITALS — BP 162/64 | HR 113 | Temp 97.8°F | Resp 17 | Ht 72.0 in | Wt 122.4 lb

## 2022-01-10 DIAGNOSIS — Z853 Personal history of malignant neoplasm of breast: Secondary | ICD-10-CM | POA: Diagnosis not present

## 2022-01-10 DIAGNOSIS — D509 Iron deficiency anemia, unspecified: Secondary | ICD-10-CM

## 2022-01-10 DIAGNOSIS — C49A0 Gastrointestinal stromal tumor, unspecified site: Secondary | ICD-10-CM

## 2022-01-10 DIAGNOSIS — D500 Iron deficiency anemia secondary to blood loss (chronic): Secondary | ICD-10-CM | POA: Insufficient documentation

## 2022-01-10 DIAGNOSIS — E538 Deficiency of other specified B group vitamins: Secondary | ICD-10-CM

## 2022-01-10 LAB — CBC WITH DIFFERENTIAL (CANCER CENTER ONLY)
Abs Immature Granulocytes: 0.04 10*3/uL (ref 0.00–0.07)
Basophils Absolute: 0.1 10*3/uL (ref 0.0–0.1)
Basophils Relative: 1 %
Eosinophils Absolute: 0.2 10*3/uL (ref 0.0–0.5)
Eosinophils Relative: 2 %
HCT: 35.5 % — ABNORMAL LOW (ref 39.0–52.0)
Hemoglobin: 11.8 g/dL — ABNORMAL LOW (ref 13.0–17.0)
Immature Granulocytes: 1 %
Lymphocytes Relative: 41 %
Lymphs Abs: 3.1 10*3/uL (ref 0.7–4.0)
MCH: 31.1 pg (ref 26.0–34.0)
MCHC: 33.2 g/dL (ref 30.0–36.0)
MCV: 93.4 fL (ref 80.0–100.0)
Monocytes Absolute: 0.7 10*3/uL (ref 0.1–1.0)
Monocytes Relative: 10 %
Neutro Abs: 3.4 10*3/uL (ref 1.7–7.7)
Neutrophils Relative %: 45 %
Platelet Count: 256 10*3/uL (ref 150–400)
RBC: 3.8 MIL/uL — ABNORMAL LOW (ref 4.22–5.81)
RDW: 14.9 % (ref 11.5–15.5)
WBC Count: 7.5 10*3/uL (ref 4.0–10.5)
nRBC: 0 % (ref 0.0–0.2)

## 2022-01-10 LAB — CMP (CANCER CENTER ONLY)
ALT: 9 U/L (ref 0–44)
AST: 15 U/L (ref 15–41)
Albumin: 3.9 g/dL (ref 3.5–5.0)
Alkaline Phosphatase: 27 U/L — ABNORMAL LOW (ref 38–126)
Anion gap: 8 (ref 5–15)
BUN: 15 mg/dL (ref 8–23)
CO2: 27 mmol/L (ref 22–32)
Calcium: 9 mg/dL (ref 8.9–10.3)
Chloride: 108 mmol/L (ref 98–111)
Creatinine: 1.04 mg/dL (ref 0.61–1.24)
GFR, Estimated: >60 mL/min (ref >60)
Glucose, Bld: 127 mg/dL — ABNORMAL HIGH (ref 70–99)
Potassium: 3.2 mmol/L — ABNORMAL LOW (ref 3.5–5.1)
Sodium: 143 mmol/L (ref 135–145)
Total Bilirubin: 1.4 mg/dL — ABNORMAL HIGH (ref 0.3–1.2)
Total Protein: 6.7 g/dL (ref 6.5–8.1)

## 2022-01-10 LAB — IRON AND IRON BINDING CAPACITY (CC-WL,HP ONLY)
Iron: 89 ug/dL (ref 45–182)
Saturation Ratios: 19 % (ref 17.9–39.5)
TIBC: 482 ug/dL — ABNORMAL HIGH (ref 250–450)
UIBC: 393 ug/dL — ABNORMAL HIGH (ref 117–376)

## 2022-01-10 LAB — FERRITIN: Ferritin: 14 ng/mL — ABNORMAL LOW (ref 24–336)

## 2022-01-10 LAB — VITAMIN B12: Vitamin B-12: 6299 pg/mL — ABNORMAL HIGH (ref 180–914)

## 2022-01-10 NOTE — Progress Notes (Signed)
Hematology and Oncology Follow Up Visit ? ?Henry [name] ?[id number] ?1951-07-07 71 y.o. ?01/10/2022 9:07 AM ?[name], MDGennosa, [name], MD  ? ?Principle Diagnosis: 71 year old man with ? ?1.  GIST of the GI tract diagnosed in 2007 with advanced disease and hepatic involvement.   ? ?2.  T1N0, stage I breast cancer diagnosed in 2015.  H ? ?3.  Iron deficiency anemia related to chronic GI losses and poor absorption noted in 2007. ? ?Prior Therapy: ?He is S/Psubtotal gastrectomy with Roux-en-Y in 2007 then briefly on study with [place] which he did not tolerate due to marked elevations in LFTs.  ? ?He had recurrent GIST in late 2012 with further surgery by Dr. Clovis Riley March 2011. Gleevec started at 200 mg after that.  ? ?Surgery by Dr. Clovis Riley Sep 27, 2011.  The pathology showed recurrent GIST 8.9 cm with resection margins not involved.  ?  ? He had stereotactic radiosurgery to the liver lesion at [facility] with a total 5000cGy in 5 fractions completed 03-18-15.  ? ?Tamoxifen 20 mg daily since 2015 completed 5 years of therapy and discontinued. ? ?Status post intravenous iron infusion given in October 2021. ? ? ?Current therapy:  ? ?Gleevec 200 mg daily since 2007.  His dose was reduced due to hepatic toxicity. ? ? ?Vitamin B12 injection 1000 ?g every 30 days.  With repeat IV iron infusion as needed. ? ?Interim History: [name] is here for a follow-up visit.  Since the last visit, he reports no major changes in his health.  He continues to tolerate Gleevec without any complaints.  He denies any nausea, vomiting or abdominal pain.  He denies any worsening diarrhea or fatigue.  Continues to be on vitamin B12 supplements at this time. ? ? ? ? ? ? ?Medications: Reviewed without changes. ?Current Outpatient Medications  ?Medication Sig Dispense Refill  ? Cyanocobalamin (VITAMIN B-12 IJ) Inject 1,000 mcg as directed every 30 (thirty) days. Reported on 12/12/2015    ? ferrous sulfate 325 (65 FE) MG tablet Take  325 mg by mouth daily.    ? imatinib (GLEEVEC) 100 MG tablet TAKE 2 TABLETS BY MOUTH DAILY. TAKE WITH MEALS AND LARGE GLASS OF WATER (CAUTION:CHEMO) 60 tablet 6  ? Iron-Vitamin C (VITRON-C PO) Take by mouth.    ? vitamin B-12 (CYANOCOBALAMIN) 100 MCG tablet Take 1 tablet (100 mcg total) by mouth daily. 90 tablet 6  ? VITAMIN D PO Take by mouth. 5,000 3 x a week    ? ?No current facility-administered medications for this visit.  ? ? ? ?Allergies:  ?Allergies  ?Allergen Reactions  ? Feraheme [Ferumoxytol] Hives  ? ?. ? ?Physical Exam: ? ? ?Blood pressure (!) 162/64, pulse (!) 113, temperature 97.8 ?F (36.6 ?C), temperature source Temporal, resp. rate 17, height 6' (1.829 m), weight 122 lb 6.4 oz (55.5 kg), SpO2 98 %. ? ? ? ? ? ? ?ECOG: 1 ? ? ?General appearance: Comfortable appearing without any discomfort ?Head: Normocephalic without any trauma ?Oropharynx: Mucous membranes are moist and pink without any thrush or ulcers. ?Eyes: Pupils are equal and round reactive to light. ?Lymph nodes: No cervical, supraclavicular, inguinal or axillary lymphadenopathy.   ?Heart:regular rate and rhythm.  S1 and S2 without leg edema. ?Lung: Clear without any rhonchi or wheezes.  No dullness to percussion. ?Abdomin: Soft, nontender, nondistended with good bowel sounds.  No hepatosplenomegaly. ?Musculoskeletal: No joint deformity or effusion.  Full range of motion noted. ?Neurological: No deficits noted on motor,  sensory and deep tendon reflex exam. ?Skin: No petechial rash or dryness.  Appeared moist.  ?Psychiatric: Mood and affect appeared appropriate. ?. ? ? ? ? ?. ? ? ? ? ?Lab Results: ?Lab Results  ?Component Value Date  ? WBC 7.5 01/10/2022  ? HGB 11.8 (L) 01/10/2022  ? HCT 35.5 (L) 01/10/2022  ? MCV 93.4 01/10/2022  ? PLT 256 01/10/2022  ? ?  Chemistry   ?   ?Component Value Date/Time  ? NA 143 07/12/2021 0950  ? NA 142 07/09/2017 1336  ? K 3.7 07/12/2021 0950  ? K 3.7 07/09/2017 1336  ? CL 107 07/12/2021 0950  ? CL 105  04/13/2013 0922  ? CO2 27 07/12/2021 0950  ? CO2 27 07/09/2017 1336  ? BUN 13 07/12/2021 0950  ? BUN 13.4 07/09/2017 1336  ? CREATININE 1.02 07/12/2021 0950  ? CREATININE 1.1 07/09/2017 1336  ?    ?Component Value Date/Time  ? CALCIUM 8.9 07/12/2021 0950  ? CALCIUM 8.7 07/09/2017 1336  ? ALKPHOS 30 (L) 07/12/2021 0950  ? ALKPHOS 25 (L) 07/09/2017 1336  ? AST 20 07/12/2021 0950  ? AST 16 07/09/2017 1336  ? ALT 10 07/12/2021 0950  ? ALT 8 07/09/2017 1336  ? BILITOT 1.3 (H) 07/12/2021 0950  ? BILITOT 1.00 07/09/2017 1336  ?  ? ? ?Impression and Plan: ? ?72 year old man with ? ?1.  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  He was found to have hepatic metastasis at that time. ? ? ?The natural course of this disease was reviewed at this time and treatment choices were discussed.  He continues to be on Gleevac without any major complications.  He continues to follow at [facility] for repeat imaging.  His last MRI was obtained in December 2022 which showed no evidence of relapsed disease. ? ? ? ?2.  Stage I breast cancer diagnosed in 2015.  He is currently in remission without any evidence of relapsed disease. ? ?3.  Anemia: Multifactorial in nature related to iron deficiency as well as B12 deficiency.  Hemoglobin slightly lower at this time at 11.8.  He is currently on vitamin supplements and will update his labs today.  Repeat IV iron infusion while his B12 injection options were discussed.  We will continue to monitor on future visits. ? ?4. Follow-up: In 6 months for repeat follow-up. ? ? ?30  minutes were dedicated to this visit.  The time was spent on reviewing laboratory data, disease, update and outlining future plan of care review. ? ?  ?  ? ? ?Zola Button, MD ?3/22/20239:07 AM ?

## 2022-01-12 ENCOUNTER — Encounter: Payer: Self-pay | Admitting: Oncology

## 2022-01-12 ENCOUNTER — Other Ambulatory Visit (HOSPITAL_COMMUNITY): Payer: Self-pay

## 2022-01-30 ENCOUNTER — Other Ambulatory Visit (HOSPITAL_COMMUNITY): Payer: Self-pay

## 2022-02-05 ENCOUNTER — Other Ambulatory Visit (HOSPITAL_COMMUNITY): Payer: Self-pay

## 2022-02-06 ENCOUNTER — Other Ambulatory Visit: Payer: Self-pay | Admitting: Oncology

## 2022-02-06 ENCOUNTER — Other Ambulatory Visit (HOSPITAL_COMMUNITY): Payer: Self-pay

## 2022-02-06 DIAGNOSIS — C49A2 Gastrointestinal stromal tumor of stomach: Secondary | ICD-10-CM

## 2022-02-06 MED ORDER — IMATINIB MESYLATE 100 MG PO TABS
ORAL_TABLET | ORAL | 6 refills | Status: DC
  Filled 2022-02-07: qty 60, 30d supply, fill #0
  Filled 2022-03-20: qty 60, 30d supply, fill #1
  Filled 2022-04-05: qty 60, 30d supply, fill #2
  Filled 2022-05-17: qty 60, 30d supply, fill #3
  Filled 2022-06-07: qty 60, 30d supply, fill #4
  Filled 2022-07-09 – 2022-07-19 (×2): qty 60, 30d supply, fill #5
  Filled 2022-08-15: qty 60, 30d supply, fill #6

## 2022-02-07 ENCOUNTER — Encounter: Payer: Self-pay | Admitting: Oncology

## 2022-02-07 ENCOUNTER — Other Ambulatory Visit (HOSPITAL_COMMUNITY): Payer: Self-pay

## 2022-02-15 ENCOUNTER — Encounter: Payer: Self-pay | Admitting: Oncology

## 2022-02-15 ENCOUNTER — Other Ambulatory Visit (HOSPITAL_COMMUNITY): Payer: Self-pay

## 2022-03-09 ENCOUNTER — Other Ambulatory Visit (HOSPITAL_COMMUNITY): Payer: Self-pay

## 2022-03-12 ENCOUNTER — Encounter: Payer: Self-pay | Admitting: Oncology

## 2022-03-12 ENCOUNTER — Other Ambulatory Visit (HOSPITAL_COMMUNITY): Payer: Self-pay

## 2022-03-20 ENCOUNTER — Other Ambulatory Visit (HOSPITAL_COMMUNITY): Payer: Self-pay

## 2022-04-05 ENCOUNTER — Encounter: Payer: Self-pay | Admitting: Oncology

## 2022-04-05 ENCOUNTER — Other Ambulatory Visit (HOSPITAL_COMMUNITY): Payer: Self-pay

## 2022-04-18 ENCOUNTER — Encounter: Payer: Self-pay | Admitting: Oncology

## 2022-04-18 ENCOUNTER — Other Ambulatory Visit (HOSPITAL_COMMUNITY): Payer: Self-pay

## 2022-05-02 ENCOUNTER — Telehealth: Payer: Self-pay | Admitting: Oncology

## 2022-05-02 NOTE — Telephone Encounter (Signed)
Called patient regarding upcoming September appointments, left a voicemail. 

## 2022-05-07 ENCOUNTER — Other Ambulatory Visit (HOSPITAL_COMMUNITY): Payer: Self-pay

## 2022-05-17 ENCOUNTER — Encounter: Payer: Self-pay | Admitting: Oncology

## 2022-05-17 ENCOUNTER — Other Ambulatory Visit (HOSPITAL_COMMUNITY): Payer: Self-pay

## 2022-05-31 ENCOUNTER — Other Ambulatory Visit (HOSPITAL_COMMUNITY): Payer: Self-pay

## 2022-06-07 ENCOUNTER — Encounter: Payer: Self-pay | Admitting: Oncology

## 2022-06-07 ENCOUNTER — Other Ambulatory Visit (HOSPITAL_COMMUNITY): Payer: Self-pay

## 2022-06-14 ENCOUNTER — Other Ambulatory Visit (HOSPITAL_COMMUNITY): Payer: Self-pay

## 2022-06-14 ENCOUNTER — Encounter: Payer: Self-pay | Admitting: Oncology

## 2022-07-06 ENCOUNTER — Other Ambulatory Visit (HOSPITAL_COMMUNITY): Payer: Self-pay

## 2022-07-09 ENCOUNTER — Other Ambulatory Visit (HOSPITAL_COMMUNITY): Payer: Self-pay

## 2022-07-09 ENCOUNTER — Encounter: Payer: Self-pay | Admitting: Oncology

## 2022-07-10 ENCOUNTER — Other Ambulatory Visit (HOSPITAL_COMMUNITY): Payer: Self-pay

## 2022-07-11 ENCOUNTER — Inpatient Hospital Stay: Payer: Medicare Other | Attending: Oncology

## 2022-07-11 ENCOUNTER — Inpatient Hospital Stay (HOSPITAL_BASED_OUTPATIENT_CLINIC_OR_DEPARTMENT_OTHER): Payer: Medicare Other | Admitting: Oncology

## 2022-07-11 ENCOUNTER — Other Ambulatory Visit: Payer: Self-pay

## 2022-07-11 VITALS — BP 130/67 | HR 60 | Temp 97.7°F | Resp 16 | Ht 72.0 in | Wt 119.0 lb

## 2022-07-11 DIAGNOSIS — D519 Vitamin B12 deficiency anemia, unspecified: Secondary | ICD-10-CM | POA: Insufficient documentation

## 2022-07-11 DIAGNOSIS — Z853 Personal history of malignant neoplasm of breast: Secondary | ICD-10-CM | POA: Insufficient documentation

## 2022-07-11 DIAGNOSIS — D509 Iron deficiency anemia, unspecified: Secondary | ICD-10-CM | POA: Diagnosis not present

## 2022-07-11 DIAGNOSIS — E538 Deficiency of other specified B group vitamins: Secondary | ICD-10-CM

## 2022-07-11 DIAGNOSIS — C49A0 Gastrointestinal stromal tumor, unspecified site: Secondary | ICD-10-CM | POA: Diagnosis present

## 2022-07-11 LAB — CBC WITH DIFFERENTIAL (CANCER CENTER ONLY)
Abs Immature Granulocytes: 0.01 10*3/uL (ref 0.00–0.07)
Basophils Absolute: 0.1 10*3/uL (ref 0.0–0.1)
Basophils Relative: 1 %
Eosinophils Absolute: 0.1 10*3/uL (ref 0.0–0.5)
Eosinophils Relative: 3 %
HCT: 35.4 % — ABNORMAL LOW (ref 39.0–52.0)
Hemoglobin: 11.6 g/dL — ABNORMAL LOW (ref 13.0–17.0)
Immature Granulocytes: 0 %
Lymphocytes Relative: 29 %
Lymphs Abs: 1.4 10*3/uL (ref 0.7–4.0)
MCH: 28.8 pg (ref 26.0–34.0)
MCHC: 32.8 g/dL (ref 30.0–36.0)
MCV: 87.8 fL (ref 80.0–100.0)
Monocytes Absolute: 0.5 10*3/uL (ref 0.1–1.0)
Monocytes Relative: 11 %
Neutro Abs: 2.8 10*3/uL (ref 1.7–7.7)
Neutrophils Relative %: 56 %
Platelet Count: 305 10*3/uL (ref 150–400)
RBC: 4.03 MIL/uL — ABNORMAL LOW (ref 4.22–5.81)
RDW: 15.9 % — ABNORMAL HIGH (ref 11.5–15.5)
WBC Count: 5 10*3/uL (ref 4.0–10.5)
nRBC: 0 % (ref 0.0–0.2)

## 2022-07-11 LAB — CMP (CANCER CENTER ONLY)
ALT: 8 U/L (ref 0–44)
AST: 17 U/L (ref 15–41)
Albumin: 3.9 g/dL (ref 3.5–5.0)
Alkaline Phosphatase: 27 U/L — ABNORMAL LOW (ref 38–126)
Anion gap: 5 (ref 5–15)
BUN: 10 mg/dL (ref 8–23)
CO2: 31 mmol/L (ref 22–32)
Calcium: 8.9 mg/dL (ref 8.9–10.3)
Chloride: 107 mmol/L (ref 98–111)
Creatinine: 1.03 mg/dL (ref 0.61–1.24)
GFR, Estimated: >60 mL/min (ref >60)
Glucose, Bld: 119 mg/dL — ABNORMAL HIGH (ref 70–99)
Potassium: 3.4 mmol/L — ABNORMAL LOW (ref 3.5–5.1)
Sodium: 143 mmol/L (ref 135–145)
Total Bilirubin: 1.3 mg/dL — ABNORMAL HIGH (ref 0.3–1.2)
Total Protein: 6.8 g/dL (ref 6.5–8.1)

## 2022-07-11 LAB — VITAMIN B12: Vitamin B-12: 416 pg/mL (ref 180–914)

## 2022-07-11 LAB — FERRITIN: Ferritin: 7 ng/mL — ABNORMAL LOW (ref 24–336)

## 2022-07-11 NOTE — Progress Notes (Signed)
Hematology and Oncology Follow Up Visit  Henry [name] [id number] 1951/05/21 71 y.o. 07/11/2022 8:25 AM Henry [name], MDGennosa, Henry Her, MD   Principle Diagnosis: 71 year old man with the following diagnoses:  1.  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  He presented with advanced disease and hepatic involvement is currently in remission.  2.  Stage I breast cancer diagnosed in 2015.  The was found to have T1N0 without any evidence of relapse.  3.  Anemia related to iron deficiency anemia as well as B12 related to chronic GI losses and poor absorption noted in 2007.  Prior Therapy: He is S/Psubtotal gastrectomy with Roux-en-Y in 2007 then briefly on study with [place] which he did not tolerate due to marked elevations in LFTs.   He had recurrent GIST in late 2012 with further surgery by Henry [name] March 2011. Gleevec started at 200 mg after that.   Surgery by Henry [name] Sep 27, 2011.  The pathology showed recurrent GIST 8.9 cm with resection margins not involved.     He had stereotactic radiosurgery to the liver lesion at [facility] with a total 5000cGy in 5 fractions completed 03-18-15.   Tamoxifen 20 mg daily since 2015 completed 5 years of therapy and discontinued.  Status post intravenous iron infusion given in October 2021.   Current therapy:   Gleevec 200 mg daily since 2007.     Vitamin B12 injection 1000 g every 30 days.  With repeat IV iron infusion as needed.  Interim History: Henry [name] presents today for return evaluation.  Since last visit, he reports no major changes in his health.  He denies any nausea, vomiting or abdominal pain.  He has reported some continued redness but no hematochezia or melena.  He denies any hospitalizations or illnesses.       Medications: Updated on review. Current Outpatient Medications  Medication Sig Dispense Refill   Cyanocobalamin (VITAMIN B-12 IJ) Inject 1,000 mcg as directed every 30  (thirty) days. Reported on 12/12/2015     ferrous sulfate 325 (65 FE) MG tablet Take 325 mg by mouth daily.     imatinib (GLEEVEC) 100 MG tablet TAKE 2 TABLETS BY MOUTH DAILY. TAKE WITH MEALS AND LARGE GLASS OF WATER (CAUTION:CHEMO) 60 tablet 6   Iron-Vitamin C (VITRON-C PO) Take by mouth.     vitamin B-12 (CYANOCOBALAMIN) 100 MCG tablet Take 1 tablet (100 mcg total) by mouth daily. 90 tablet 6   VITAMIN D PO Take by mouth. 5,000 3 x a week     No current facility-administered medications for this visit.     Allergies:  Allergies  Allergen Reactions   Feraheme [Ferumoxytol] Hives   .  Physical Exam:       Blood pressure 130/67, pulse 60, temperature 97.7 F (36.5 C), temperature source Temporal, resp. rate 16, height 6' (1.829 m), weight 119 lb (54 kg), SpO2 95 %.    ECOG: 1     General appearance: Alert, awake without any distress. Head: Atraumatic without abnormalities Oropharynx: Without any thrush or ulcers. Eyes: No scleral icterus. Lymph nodes: No lymphadenopathy noted in the cervical, supraclavicular, or axillary nodes Heart:regular rate and rhythm, without any murmurs or gallops.   Lung: Clear to auscultation without any rhonchi, wheezes or dullness to percussion. Abdomin: Soft, nontender without any shifting dullness or ascites. Musculoskeletal: No clubbing or cyanosis. Neurological: No motor or sensory deficits. Skin: No rashes or lesions.   Marland Kitchen     Marland Kitchen  Lab Results: Lab Results  Component Value Date   WBC 7.5 01/10/2022   HGB 11.8 (L) 01/10/2022   HCT 35.5 (L) 01/10/2022   MCV 93.4 01/10/2022   PLT 256 01/10/2022   PSA 0.60 11/18/2013     Chemistry      Component Value Date/Time   NA 143 01/10/2022 0854   NA 142 07/09/2017 1336   K 3.2 (L) 01/10/2022 0854   K 3.7 07/09/2017 1336   CL 108 01/10/2022 0854   CL 105 04/13/2013 0922   CO2 27 01/10/2022 0854   CO2 27 07/09/2017 1336   BUN 15 01/10/2022 0854   BUN 13.4 07/09/2017 1336    CREATININE 1.04 01/10/2022 0854   CREATININE 1.1 07/09/2017 1336      Component Value Date/Time   CALCIUM 9.0 01/10/2022 0854   CALCIUM 8.7 07/09/2017 1336   ALKPHOS 27 (L) 01/10/2022 0854   ALKPHOS 25 (L) 07/09/2017 1336   AST 15 01/10/2022 0854   AST 16 07/09/2017 1336   ALT 9 01/10/2022 0854   ALT 8 07/09/2017 1336   BILITOT 1.4 (H) 01/10/2022 0854   BILITOT 1.00 07/09/2017 1336      Impression and Plan:  71 year old man with  1.  Advanced GIST with hepatic involvement diagnosed in 2007.    He had remains on active surveillance and Gleevac under the care of Dr. Kendall [name] at [facility].  MRI obtained on March 23, 2022 showed no progression of disease.  No additional treatment is needed at this time.   2.  Breast cancer but is currently in remission diagnosed in 2015.  He was found to have stage I disease.  3.  Anemia: Iron studies obtained in March 2022 showed iron level of 89 but decreasing his ferritin and iron binding capacity.  His hemoglobin has drifted down and could benefit from repeat intravenous iron infusion.  Risks and benefits of that discussed today.  Complications that include arthralgias, myalgias and infusion related issues were reviewed.  He is agreeable to proceed at this time we will proceed with Venofer for total of 800 mg.  4. Follow-up: In 6 months for a follow-up.   30  minutes were spent on this encounter.  The time was dedicated to reviewing laboratory data, disease status update and outlining future plan of care discussion.        Henry Button, MD 9/20/20238:25 AM

## 2022-07-18 ENCOUNTER — Ambulatory Visit: Payer: Medicare Other

## 2022-07-19 ENCOUNTER — Other Ambulatory Visit (HOSPITAL_COMMUNITY): Payer: Self-pay

## 2022-07-19 ENCOUNTER — Encounter: Payer: Self-pay | Admitting: Oncology

## 2022-07-25 ENCOUNTER — Inpatient Hospital Stay: Payer: Medicare Other | Attending: Oncology

## 2022-07-25 VITALS — BP 130/70 | HR 68 | Temp 97.7°F | Resp 16

## 2022-07-25 DIAGNOSIS — E538 Deficiency of other specified B group vitamins: Secondary | ICD-10-CM

## 2022-07-25 DIAGNOSIS — D500 Iron deficiency anemia secondary to blood loss (chronic): Secondary | ICD-10-CM | POA: Diagnosis present

## 2022-07-25 MED ORDER — SODIUM CHLORIDE 0.9 % IV SOLN: Freq: Once | INTRAVENOUS | Status: AC

## 2022-07-25 MED ORDER — SODIUM CHLORIDE 0.9 % IV SOLN
200.0000 mg | Freq: Once | INTRAVENOUS | Status: AC
  Administered 2022-07-25: 200 mg via INTRAVENOUS
  Filled 2022-07-25: qty 200

## 2022-07-25 NOTE — Patient Instructions (Signed)

## 2022-08-01 ENCOUNTER — Inpatient Hospital Stay: Payer: Medicare Other

## 2022-08-08 ENCOUNTER — Inpatient Hospital Stay: Payer: Medicare Other

## 2022-08-08 ENCOUNTER — Other Ambulatory Visit (HOSPITAL_COMMUNITY): Payer: Self-pay

## 2022-08-08 VITALS — BP 130/70 | HR 63 | Temp 97.8°F | Resp 16

## 2022-08-08 DIAGNOSIS — E538 Deficiency of other specified B group vitamins: Secondary | ICD-10-CM

## 2022-08-08 DIAGNOSIS — D500 Iron deficiency anemia secondary to blood loss (chronic): Secondary | ICD-10-CM | POA: Diagnosis not present

## 2022-08-08 MED ORDER — SODIUM CHLORIDE 0.9 % IV SOLN: Freq: Once | INTRAVENOUS | Status: AC

## 2022-08-08 MED ORDER — SODIUM CHLORIDE 0.9 % IV SOLN
200.0000 mg | Freq: Once | INTRAVENOUS | Status: AC
  Administered 2022-08-08: 200 mg via INTRAVENOUS
  Filled 2022-08-08: qty 200

## 2022-08-08 NOTE — Progress Notes (Signed)
Patient declined to stay for 30 minute observation. Tolerated iron infusion well. Discharged to lobby ambulatory, VSS.

## 2022-08-08 NOTE — Patient Instructions (Signed)

## 2022-08-15 ENCOUNTER — Ambulatory Visit: Payer: Medicare Other

## 2022-08-15 ENCOUNTER — Other Ambulatory Visit (HOSPITAL_COMMUNITY): Payer: Self-pay

## 2022-09-03 ENCOUNTER — Other Ambulatory Visit (HOSPITAL_COMMUNITY): Payer: Self-pay

## 2022-09-05 ENCOUNTER — Other Ambulatory Visit (HOSPITAL_COMMUNITY): Payer: Self-pay

## 2022-09-10 ENCOUNTER — Other Ambulatory Visit (HOSPITAL_COMMUNITY): Payer: Self-pay

## 2022-09-10 ENCOUNTER — Other Ambulatory Visit: Payer: Self-pay | Admitting: Oncology

## 2022-09-10 DIAGNOSIS — C49A2 Gastrointestinal stromal tumor of stomach: Secondary | ICD-10-CM

## 2022-09-11 ENCOUNTER — Other Ambulatory Visit (HOSPITAL_COMMUNITY): Payer: Self-pay

## 2022-09-11 MED ORDER — IMATINIB MESYLATE 100 MG PO TABS
ORAL_TABLET | ORAL | 6 refills | Status: DC
  Filled 2022-09-11 – 2022-09-14 (×2): qty 60, 30d supply, fill #0
  Filled 2022-10-09: qty 60, 30d supply, fill #1
  Filled 2022-11-12: qty 60, 30d supply, fill #2
  Filled 2022-12-20: qty 60, 30d supply, fill #3

## 2022-09-14 ENCOUNTER — Other Ambulatory Visit (HOSPITAL_COMMUNITY): Payer: Self-pay

## 2022-09-20 ENCOUNTER — Telehealth: Payer: Self-pay | Admitting: Oncology

## 2022-09-20 NOTE — Telephone Encounter (Signed)
Called patient to notify of dr. Alen Blew transition. Patient notified and r/s with new provider.

## 2022-10-09 ENCOUNTER — Encounter: Payer: Self-pay | Admitting: Oncology

## 2022-10-09 ENCOUNTER — Other Ambulatory Visit (HOSPITAL_COMMUNITY): Payer: Self-pay

## 2022-10-16 ENCOUNTER — Other Ambulatory Visit (HOSPITAL_COMMUNITY): Payer: Self-pay

## 2022-10-17 ENCOUNTER — Other Ambulatory Visit (HOSPITAL_COMMUNITY): Payer: Self-pay

## 2022-11-08 ENCOUNTER — Other Ambulatory Visit (HOSPITAL_COMMUNITY): Payer: Self-pay

## 2022-11-12 ENCOUNTER — Encounter: Payer: Self-pay | Admitting: Oncology

## 2022-11-12 ENCOUNTER — Other Ambulatory Visit: Payer: Self-pay

## 2022-11-14 ENCOUNTER — Other Ambulatory Visit: Payer: Self-pay

## 2022-11-15 ENCOUNTER — Other Ambulatory Visit: Payer: Self-pay

## 2022-12-06 ENCOUNTER — Other Ambulatory Visit (HOSPITAL_COMMUNITY): Payer: Self-pay

## 2022-12-20 ENCOUNTER — Other Ambulatory Visit (HOSPITAL_COMMUNITY): Payer: Self-pay

## 2023-01-03 ENCOUNTER — Other Ambulatory Visit (HOSPITAL_COMMUNITY): Payer: Self-pay

## 2023-01-03 ENCOUNTER — Other Ambulatory Visit: Payer: Self-pay

## 2023-01-03 DIAGNOSIS — C49A2 Gastrointestinal stromal tumor of stomach: Secondary | ICD-10-CM

## 2023-01-03 MED ORDER — IMATINIB MESYLATE 100 MG PO TABS
ORAL_TABLET | ORAL | 6 refills | Status: DC
  Filled 2023-01-03: qty 60, fill #0
  Filled 2023-01-09 (×2): qty 60, 30d supply, fill #0
  Filled 2023-02-08 – 2023-02-14 (×2): qty 60, 30d supply, fill #1
  Filled 2023-03-20: qty 60, 30d supply, fill #2
  Filled 2023-04-18: qty 60, 30d supply, fill #3
  Filled 2023-05-15: qty 60, 30d supply, fill #4
  Filled 2023-06-20: qty 60, 30d supply, fill #5
  Filled 2023-07-08: qty 60, 30d supply, fill #6
  Filled ????-??-?? (×2): fill #2

## 2023-01-04 ENCOUNTER — Other Ambulatory Visit: Payer: Self-pay

## 2023-01-08 ENCOUNTER — Other Ambulatory Visit (HOSPITAL_COMMUNITY): Payer: Self-pay

## 2023-01-09 ENCOUNTER — Other Ambulatory Visit: Payer: Self-pay

## 2023-01-09 ENCOUNTER — Ambulatory Visit: Payer: Medicare Other | Admitting: Oncology

## 2023-01-09 ENCOUNTER — Other Ambulatory Visit (HOSPITAL_COMMUNITY): Payer: Self-pay

## 2023-01-09 ENCOUNTER — Other Ambulatory Visit: Payer: Medicare Other

## 2023-01-10 ENCOUNTER — Inpatient Hospital Stay: Payer: Medicare Other | Attending: Hematology

## 2023-01-10 ENCOUNTER — Encounter: Payer: Self-pay | Admitting: Hematology

## 2023-01-10 ENCOUNTER — Inpatient Hospital Stay (HOSPITAL_BASED_OUTPATIENT_CLINIC_OR_DEPARTMENT_OTHER): Payer: Medicare Other | Admitting: Hematology

## 2023-01-10 ENCOUNTER — Other Ambulatory Visit: Payer: Self-pay

## 2023-01-10 ENCOUNTER — Other Ambulatory Visit (HOSPITAL_COMMUNITY): Payer: Self-pay

## 2023-01-10 VITALS — BP 140/84 | HR 69 | Temp 98.5°F | Resp 18 | Ht 72.0 in | Wt 119.7 lb

## 2023-01-10 DIAGNOSIS — Z853 Personal history of malignant neoplasm of breast: Secondary | ICD-10-CM | POA: Diagnosis not present

## 2023-01-10 DIAGNOSIS — E538 Deficiency of other specified B group vitamins: Secondary | ICD-10-CM | POA: Insufficient documentation

## 2023-01-10 DIAGNOSIS — D500 Iron deficiency anemia secondary to blood loss (chronic): Secondary | ICD-10-CM

## 2023-01-10 DIAGNOSIS — C787 Secondary malignant neoplasm of liver and intrahepatic bile duct: Secondary | ICD-10-CM | POA: Diagnosis not present

## 2023-01-10 DIAGNOSIS — C49A2 Gastrointestinal stromal tumor of stomach: Secondary | ICD-10-CM | POA: Diagnosis not present

## 2023-01-10 LAB — CBC WITH DIFFERENTIAL (CANCER CENTER ONLY)
Abs Immature Granulocytes: 0.01 10*3/uL (ref 0.00–0.07)
Basophils Absolute: 0.1 10*3/uL (ref 0.0–0.1)
Basophils Relative: 1 %
Eosinophils Absolute: 0.1 10*3/uL (ref 0.0–0.5)
Eosinophils Relative: 3 %
HCT: 35.2 % — ABNORMAL LOW (ref 39.0–52.0)
Hemoglobin: 11.9 g/dL — ABNORMAL LOW (ref 13.0–17.0)
Immature Granulocytes: 0 %
Lymphocytes Relative: 36 %
Lymphs Abs: 1.8 10*3/uL (ref 0.7–4.0)
MCH: 31.2 pg (ref 26.0–34.0)
MCHC: 33.8 g/dL (ref 30.0–36.0)
MCV: 92.4 fL (ref 80.0–100.0)
Monocytes Absolute: 0.7 10*3/uL (ref 0.1–1.0)
Monocytes Relative: 13 %
Neutro Abs: 2.4 10*3/uL (ref 1.7–7.7)
Neutrophils Relative %: 47 %
Platelet Count: 290 10*3/uL (ref 150–400)
RBC: 3.81 MIL/uL — ABNORMAL LOW (ref 4.22–5.81)
RDW: 14.6 % (ref 11.5–15.5)
WBC Count: 5.1 10*3/uL (ref 4.0–10.5)
nRBC: 0 % (ref 0.0–0.2)

## 2023-01-10 LAB — FERRITIN: Ferritin: 11 ng/mL — ABNORMAL LOW (ref 24–336)

## 2023-01-10 LAB — CMP (CANCER CENTER ONLY)
ALT: 8 U/L (ref 0–44)
AST: 16 U/L (ref 15–41)
Albumin: 3.7 g/dL (ref 3.5–5.0)
Alkaline Phosphatase: 28 U/L — ABNORMAL LOW (ref 38–126)
Anion gap: 5 (ref 5–15)
BUN: 10 mg/dL (ref 8–23)
CO2: 31 mmol/L (ref 22–32)
Calcium: 8.6 mg/dL — ABNORMAL LOW (ref 8.9–10.3)
Chloride: 107 mmol/L (ref 98–111)
Creatinine: 1.04 mg/dL (ref 0.61–1.24)
GFR, Estimated: >60 mL/min (ref >60)
Glucose, Bld: 103 mg/dL — ABNORMAL HIGH (ref 70–99)
Potassium: 3.3 mmol/L — ABNORMAL LOW (ref 3.5–5.1)
Sodium: 143 mmol/L (ref 135–145)
Total Bilirubin: 1.1 mg/dL (ref 0.3–1.2)
Total Protein: 6.5 g/dL (ref 6.5–8.1)

## 2023-01-10 LAB — VITAMIN B12: Vitamin B-12: 178 pg/mL — ABNORMAL LOW (ref 180–914)

## 2023-01-10 NOTE — Progress Notes (Signed)
Henry [name]   [phone] [phone]   Clinic Follow up Note   Patient Care Team: [name], MD as PCP - Charissa Bash, MD as Attending Physician (Hematology and Oncology)  Date of Service:  01/10/2023  CHIEF COMPLAINT: f/u of Anemia  CURRENT THERAPY:  Gleevec 200 mg daily since 2007.    Vitamin B12 injection 1000 g every 30 days.  With repeat IV iron infusion as needed.     ASSESSMENT:  Henry [name] is a 72 y.o. 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 -He had recurrent chest in liver in late 2012 with further surgery by Dr. Clovis Riley in [facility].  [facility] restarted at low-dose 200 mg daily, and he has been on it since then. -He had stereotactic radiosurgery to the liver lesion at [facility] with a total of 5000 cGray in 5 fractions and completed in May 2016. -Last liver MRI with and without contrast in December 2023 at [facility] showed similar posttreatment changes in segment 7 without evidence of progression, no new lesions. -He follows Dr. Kendall Flack at [facility]  -He is clinically doing very well, asymptomatic, no concern for progression.  2.  Left Breast cancer, stage I, er+/pr+/her2-, diagnosed in 2014, s/p lumpectomy  -He completed tamoxifen 20 mg daily in 2020.  3.  Iron deficient anemia due to GI bleeding -Previously received IV infusion as needed  4. B12 deficiency -Secondary to his gastric surgery -He was previously on B12 injection, last dose in 2020 -He is not on oral B12, I encouraged him to restart. -Today's B12 level still pending, will decide if he needs B12 injections   PLAN: -lab reviewed -recommend to take OTC B12  -lab, f/u in 6 months. - pt does MRI/SCAN at [place] refill Gleevec 200 mg    INTERVAL  HISTORY:  Henry [name] is here for a follow up of Anemia He was last seen by Dr.Shadad on 07/11/2022 He presents to the clinic alone. Pt gave history of his diagnoses. Pt stress he would like to continue care with [facility]. Pt denies taken oral B12 COMPLEX.    All other systems were reviewed with the patient and are negative.  MEDICAL HISTORY:  Past Medical History:  Diagnosis Date   Anemia 2008   RESOLVED  WITH IV IRON DEXTRAN AS HE WAS UNABLE  TO ABSORB  ORAL IRON.     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 Cornett, MD;  Location: [place];  Service: General;  Laterality: Left;   BILROTH II PROCEDURE  12-31-2005   CHOLECYSTECTOMY  12-31-2005   DONE WITH GASTRECTOMY BY DR. [place] 2008  RIGHT SIDE/ FOR T1a PAPILLARY RENAL CELL CARCINOMA   RE-EXCISION OF BREAST LUMPECTOMY Left 10/14/2013   Procedure: RE-EXCISION OF BREAST LUMPECTOMY;  Surgeon: Marcello Moores A. Cornett, MD;  Location: [place];  Service: General;  Laterality: Left;   TUMOR REMOVAL  MARCH 2011   FOR RECURRENT GIST BY DR. Clovis Riley AT [facility]    I have reviewed the social history and family history with the patient and they are unchanged from previous note.  ALLERGIES:  is allergic to feraheme [ferumoxytol].  MEDICATIONS:  Current Outpatient Medications  Medication Sig Dispense Refill    Cyanocobalamin (VITAMIN B-12 IJ) Inject 1,000 mcg as directed every 30 (thirty) days. Reported on 12/12/2015     ferrous sulfate 325 (65 FE) MG tablet Take 325 mg by mouth daily.     imatinib (GLEEVEC) 100 MG tablet TAKE 2 TABLETS BY MOUTH DAILY. TAKE WITH MEALS AND LARGE GLASS OF WATER (CAUTION:CHEMO) 60 tablet 6   Iron-Vitamin C (VITRON-C PO) Take by mouth.     vitamin B-12 (CYANOCOBALAMIN) 100 MCG tablet Take 1 tablet (100 mcg total) by mouth daily. 90 tablet 6   VITAMIN D PO Take by mouth. 5,000 3 x a week     No current facility-administered medications for this visit.    PHYSICAL EXAMINATION: ECOG PERFORMANCE STATUS: 0 - Asymptomatic  Vitals:   01/10/23 1441  BP: (!) 140/84  Pulse: 69  Resp: 18  Temp: 98.5 F (36.9 C)  SpO2: 100%   Wt Readings from Last 3 Encounters:  01/10/23 119 lb 11.2 oz (54.3 kg)  07/11/22 119 lb (54 kg)  01/10/22 122 lb 6.4 oz (55.5 kg)      LYMPH:  no palpable lymphadenopathy in the cervical, axillary  LUNGS: (-) clear to auscultation and percussion with normal breathing effort HEART: (-) regular rate & rhythm and no murmurs and no lower extremity edema ABDOMEN:(-) abdomen soft, non-tender and normal bowel sounds   LABORATORY DATA:  I have reviewed the data as listed    Latest Ref Rng & Units 01/10/2023    2:22 PM 07/11/2022    8:20 AM 01/10/2022    8:54 AM  CBC  WBC 4.0 - 10.5 K/uL 5.1  5.0  7.5   Hemoglobin 13.0 - 17.0 g/dL 11.9  11.6  11.8   Hematocrit 39.0 - 52.0 % 35.2  35.4  35.5   Platelets 150 - 400 K/uL 290  305  256         Latest Ref Rng & Units 07/11/2022    8:20 AM 01/10/2022    8:54 AM 07/12/2021    9:50 AM  CMP  Glucose 70 - 99 mg/dL 119  127  93   BUN 8 - 23 mg/dL 10  15  13    Creatinine 0.61 - 1.24 mg/dL 1.03  1.04  1.02   Sodium 135 - 145 mmol/L 143  143  143   Potassium 3.5 - 5.1 mmol/L 3.4  3.2  3.7   Chloride 98 - 111 mmol/L 107  108  107   CO2 22 - 32 mmol/L 31  27  27    Calcium 8.9 - 10.3 mg/dL 8.9  9.0  8.9    Total Protein 6.5 - 8.1 g/dL 6.8  6.7  6.8   Total Bilirubin 0.3 - 1.2 mg/dL 1.3  1.4  1.3   Alkaline Phos 38 - 126 U/L 27  27  30    AST 15 - 41 U/L 17  15  20  ALT 0 - 44 U/L 8  9  10        RADIOGRAPHIC STUDIES: I have personally reviewed the radiological images as listed and agreed with the findings in the report. No results found.    No orders of the defined types were placed in this encounter.  All questions were answered. The patient knows to call the clinic with any problems, questions or concerns. No barriers to learning was detected. The total time spent in the appointment was 40 minutes.     Truitt Merle, MD 01/10/2023   Felicity Coyer, CMA, am acting as scribe for Truitt Merle, MD.   I have reviewed the above documentation for accuracy and completeness, and I agree with the above.

## 2023-01-11 ENCOUNTER — Encounter: Payer: Self-pay | Admitting: Hematology

## 2023-01-15 ENCOUNTER — Telehealth: Payer: Self-pay

## 2023-01-15 ENCOUNTER — Other Ambulatory Visit: Payer: Self-pay

## 2023-01-15 NOTE — Telephone Encounter (Signed)
Open in error

## 2023-01-16 ENCOUNTER — Telehealth: Payer: Self-pay

## 2023-01-16 NOTE — Telephone Encounter (Addendum)
Tried to call patient to relay message below but there was no answer so I left a voice mail to call us back with my direct line number.     ----- Message from Truitt Merle, MD sent at 01/12/2023  6:17 PM EDT ----- Please let him know his iron and B12 levels are low. If he is not taking oral supplements please let him start now, if he is Ok with home B12 injections, please call in B12 injection 1048mcg weeklyx4 then monthly. He lives far away from [place], may not be willing to come in for iv iron and B12 injections, but if he is, then schedule them monthly until next visit. Thanks   YF

## 2023-01-17 ENCOUNTER — Telehealth: Payer: Self-pay

## 2023-01-17 NOTE — Telephone Encounter (Signed)
Tried to call patient to give him lab results as per Dr. Burr Medico but their was no answer so I left a message to call us back as soon as possible.   Truitt Merle, MD sent to Evalee Jefferson, RN Cc: Elayne Guerin, CMA Please let him know his iron and B12 levels are low. If he is not taking oral supplements please let him start now, if he is Ok with home B12 injections, please call in B12 injection 1012mcg weeklyx4 then monthly. He lives far away from [place], may not be willing to come in for iv iron and B12 injections, but if he is, then schedule them monthly until next visit. Thanks  YF

## 2023-01-21 ENCOUNTER — Telehealth: Payer: Self-pay

## 2023-01-21 NOTE — Telephone Encounter (Signed)
Sent an email to try and contact patient after multiple attempts to call him about his current lab results and what Dr. Burr Medico wants him to do. Asked him to contact so we can go over the results.

## 2023-01-23 ENCOUNTER — Telehealth: Payer: Self-pay

## 2023-01-23 NOTE — Telephone Encounter (Addendum)
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 I forwarded the message below to the patient and Im waiting on a reply back from him.   ----- Message from Truitt Merle, MD sent at 01/12/2023  6:17 PM EDT ----- Please let him know his iron and B12 levels are low. If he is not taking oral supplements please let him start now, if he is Ok with home B12 injections, please call in B12 injection 1060mcg weeklyx4 then monthly. He lives far away from [place], may not be willing to come in for iv iron and B12 injections, but if he is, then schedule them monthly until next visit. Thanks   YF

## 2023-02-08 ENCOUNTER — Other Ambulatory Visit: Payer: Self-pay

## 2023-02-08 ENCOUNTER — Other Ambulatory Visit (HOSPITAL_COMMUNITY): Payer: Self-pay

## 2023-02-14 ENCOUNTER — Other Ambulatory Visit (HOSPITAL_COMMUNITY): Payer: Self-pay

## 2023-02-14 ENCOUNTER — Other Ambulatory Visit: Payer: Self-pay

## 2023-03-07 ENCOUNTER — Other Ambulatory Visit (HOSPITAL_COMMUNITY): Payer: Self-pay

## 2023-03-20 ENCOUNTER — Other Ambulatory Visit (HOSPITAL_COMMUNITY): Payer: Self-pay

## 2023-03-20 ENCOUNTER — Other Ambulatory Visit: Payer: Self-pay

## 2023-04-09 ENCOUNTER — Other Ambulatory Visit (HOSPITAL_COMMUNITY): Payer: Self-pay

## 2023-04-18 ENCOUNTER — Other Ambulatory Visit (HOSPITAL_COMMUNITY): Payer: Self-pay

## 2023-04-18 ENCOUNTER — Other Ambulatory Visit: Payer: Self-pay

## 2023-05-09 ENCOUNTER — Other Ambulatory Visit: Payer: Self-pay

## 2023-05-15 ENCOUNTER — Other Ambulatory Visit (HOSPITAL_COMMUNITY): Payer: Self-pay

## 2023-05-15 ENCOUNTER — Other Ambulatory Visit: Payer: Self-pay

## 2023-06-11 ENCOUNTER — Other Ambulatory Visit (HOSPITAL_COMMUNITY): Payer: Self-pay

## 2023-06-20 ENCOUNTER — Other Ambulatory Visit (HOSPITAL_COMMUNITY): Payer: Self-pay

## 2023-07-08 ENCOUNTER — Other Ambulatory Visit (HOSPITAL_COMMUNITY): Payer: Self-pay

## 2023-07-08 ENCOUNTER — Encounter: Payer: Self-pay | Admitting: Hematology

## 2023-07-16 ENCOUNTER — Other Ambulatory Visit: Payer: Self-pay

## 2023-07-16 ENCOUNTER — Encounter: Payer: Self-pay | Admitting: Hematology

## 2023-08-07 ENCOUNTER — Encounter: Payer: Self-pay | Admitting: Hematology

## 2023-08-07 ENCOUNTER — Other Ambulatory Visit: Payer: Self-pay | Admitting: Hematology

## 2023-08-07 ENCOUNTER — Other Ambulatory Visit: Payer: Self-pay

## 2023-08-07 DIAGNOSIS — C49A2 Gastrointestinal stromal tumor of stomach: Secondary | ICD-10-CM

## 2023-08-07 MED ORDER — IMATINIB MESYLATE 100 MG PO TABS
ORAL_TABLET | ORAL | 6 refills | Status: DC
  Filled 2023-08-07: qty 60, 30d supply, fill #0
  Filled 2023-09-10: qty 60, 30d supply, fill #1
  Filled 2023-10-09: qty 60, 30d supply, fill #2
  Filled 2023-11-05: qty 60, 30d supply, fill #3
  Filled 2023-12-04: qty 60, 30d supply, fill #4
  Filled 2024-01-06 (×2): qty 60, 30d supply, fill #5
  Filled 2024-02-04: qty 60, 30d supply, fill #6

## 2023-08-07 NOTE — Progress Notes (Signed)
Specialty Pharmacy Refill Coordination Note  Henry [name] is a 72 y.o. male contacted today regarding refills of specialty medication(s) Imatinib Mesylate (Antineoplastic Enzyme Inhibitors)   Patient requested Delivery   Delivery date: 08/19/23   Verified address: [address] [place] [address]   Medication will be filled on 08/16/23.  Pending refill request. Call if delayed

## 2023-08-08 ENCOUNTER — Other Ambulatory Visit: Payer: Self-pay

## 2023-08-08 NOTE — Progress Notes (Signed)
Refill received

## 2023-08-12 ENCOUNTER — Other Ambulatory Visit: Payer: Self-pay

## 2023-08-16 ENCOUNTER — Encounter: Payer: Self-pay | Admitting: Hematology

## 2023-08-16 ENCOUNTER — Other Ambulatory Visit: Payer: Self-pay

## 2023-09-10 ENCOUNTER — Other Ambulatory Visit: Payer: Self-pay

## 2023-09-10 ENCOUNTER — Encounter: Payer: Self-pay | Admitting: Hematology

## 2023-09-10 NOTE — Progress Notes (Signed)
Specialty Pharmacy Refill Coordination Note  Henry [name] is a 72 y.o. male contacted today regarding refills of specialty medication(s) Imatinib Mesylate (Antineoplastic Enzyme Inhibitors)   Patient requested Delivery   Delivery date: 09/20/23   Verified address: [address] [name] [address]   Medication will be filled on 09/18/23, per patient request.

## 2023-09-18 ENCOUNTER — Other Ambulatory Visit: Payer: Self-pay

## 2023-09-18 ENCOUNTER — Encounter: Payer: Self-pay | Admitting: Hematology

## 2023-10-09 ENCOUNTER — Encounter: Payer: Self-pay | Admitting: Hematology

## 2023-10-09 ENCOUNTER — Other Ambulatory Visit: Payer: Self-pay

## 2023-10-09 NOTE — Progress Notes (Signed)
Specialty Pharmacy Refill Coordination Note  Henry [name] is a 72 y.o. male contacted today regarding refills of specialty medication(s) Imatinib Mesylate (GLEEVEC)   Patient requested Delivery   Delivery date: 10/18/23   Verified address: [address] [name] [address]   Medication will be filled on 10/17/23.   Discount cash pricing $64.80.

## 2023-10-17 ENCOUNTER — Encounter: Payer: Self-pay | Admitting: Hematology

## 2023-10-17 ENCOUNTER — Other Ambulatory Visit: Payer: Self-pay

## 2023-11-05 ENCOUNTER — Other Ambulatory Visit: Payer: Self-pay

## 2023-11-05 ENCOUNTER — Encounter: Payer: Self-pay | Admitting: Hematology

## 2023-11-05 NOTE — Progress Notes (Signed)
 Specialty Pharmacy Refill Coordination Note  Henry [name] is a 73 y.o. male contacted today regarding refills of specialty medication(s) Imatinib  Mesylate (GLEEVEC )   Patient requested Delivery   Delivery date: 11/14/23   Verified address: [address]   [place] [address]   Medication will be filled on 11/13/23.

## 2023-11-05 NOTE — Progress Notes (Signed)
 Specialty Pharmacy Ongoing Clinical Assessment Note  Henry [name] is a 73 y.o. male who is being followed by the specialty pharmacy service for RxSp Oncology   Patient's specialty medication(s) reviewed today: Imatinib  Mesylate (GLEEVEC )   Missed doses in the last 4 weeks: 0   Patient/Caregiver did not have any additional questions or concerns.   Therapeutic benefit summary: Patient is achieving benefit   Adverse events/side effects summary: Experienced adverse events/side effects (neuropathy of feet, continuous - no changes)   Patient's therapy is appropriate to: Continue    Goals Addressed             This Visit's Progress    Slow Disease Progression       Patient is on track. Patient will maintain adherence         Follow up:  6 months  Ayane Delancey M Rafaela Dinius Specialty Pharmacist

## 2023-12-04 ENCOUNTER — Other Ambulatory Visit: Payer: Self-pay

## 2023-12-04 NOTE — Progress Notes (Signed)
Specialty Pharmacy Refill Coordination Note  Henry [name] is a 73 y.o. male contacted today regarding refills of specialty medication(s) Imatinib Mesylate (GLEEVEC)   Patient requested Delivery   Delivery date: 12/13/23   Verified address: [address] [place] [address]   Medication will be filled on 12/12/2023.   Patient aware of $40 copay. He is requesting a slightly earlier fill date as he is traveling the week of the 24th.

## 2023-12-13 ENCOUNTER — Other Ambulatory Visit: Payer: Self-pay

## 2023-12-24 ENCOUNTER — Other Ambulatory Visit: Payer: Self-pay

## 2023-12-31 ENCOUNTER — Other Ambulatory Visit: Payer: Self-pay

## 2024-01-02 ENCOUNTER — Other Ambulatory Visit: Payer: Self-pay

## 2024-01-06 ENCOUNTER — Other Ambulatory Visit: Payer: Self-pay

## 2024-01-06 ENCOUNTER — Encounter: Payer: Self-pay | Admitting: Hematology

## 2024-01-06 ENCOUNTER — Other Ambulatory Visit: Payer: Self-pay | Admitting: Pharmacy Technician

## 2024-01-06 NOTE — Progress Notes (Signed)
 Specialty Pharmacy Refill Coordination Note  Henry [name] is a 73 y.o. male contacted today regarding refills of specialty medication(s) Imatinib Mesylate (GLEEVEC)   Patient requested Delivery   Delivery date: 01/14/24   Verified address: Patient address [address]  [place]   Medication will be filled on 01/13/24.

## 2024-01-13 ENCOUNTER — Other Ambulatory Visit (HOSPITAL_COMMUNITY): Payer: Self-pay

## 2024-01-13 ENCOUNTER — Other Ambulatory Visit: Payer: Self-pay

## 2024-01-14 ENCOUNTER — Other Ambulatory Visit (HOSPITAL_COMMUNITY): Payer: Self-pay

## 2024-01-14 ENCOUNTER — Other Ambulatory Visit: Payer: Self-pay

## 2024-01-15 ENCOUNTER — Other Ambulatory Visit (HOSPITAL_COMMUNITY): Payer: Self-pay

## 2024-01-15 ENCOUNTER — Other Ambulatory Visit: Payer: Self-pay

## 2024-02-04 ENCOUNTER — Encounter: Payer: Self-pay | Admitting: Hematology

## 2024-02-04 ENCOUNTER — Other Ambulatory Visit: Payer: Self-pay | Admitting: Pharmacy Technician

## 2024-02-04 ENCOUNTER — Other Ambulatory Visit: Payer: Self-pay

## 2024-02-04 NOTE — Progress Notes (Signed)
 Specialty Pharmacy Refill Coordination Note  Henry [name] is a 73 y.o. male contacted today regarding refills of specialty medication(s) No data recorded  Patient requested (Patient-Rptd) Delivery   Delivery date: (Patient-Rptd) 02/14/24   Verified address: (Patient-Rptd) [address] [place] [address]   Medication will be filled on 02/13/24.

## 2024-02-13 ENCOUNTER — Encounter: Payer: Self-pay | Admitting: Hematology

## 2024-02-13 ENCOUNTER — Other Ambulatory Visit: Payer: Self-pay

## 2024-03-02 ENCOUNTER — Other Ambulatory Visit: Payer: Self-pay

## 2024-03-02 ENCOUNTER — Other Ambulatory Visit: Payer: Self-pay | Admitting: Hematology

## 2024-03-02 ENCOUNTER — Other Ambulatory Visit: Payer: Self-pay | Admitting: Pharmacy Technician

## 2024-03-02 DIAGNOSIS — C49A2 Gastrointestinal stromal tumor of stomach: Secondary | ICD-10-CM

## 2024-03-02 NOTE — Progress Notes (Signed)
 Specialty Pharmacy Refill Coordination Note  Henry [name] is a 73 y.o. male contacted today regarding refills of specialty medication(s) Imatinib  Mesylate (GLEEVEC )   Patient requested (Patient-Rptd) Delivery   Delivery date: (Patient-Rptd) 03/20/24   Verified address: (Patient-Rptd) [address], [place] [address]   Medication will be filled on 03/19/24.

## 2024-03-03 ENCOUNTER — Other Ambulatory Visit: Payer: Self-pay

## 2024-03-03 ENCOUNTER — Encounter: Payer: Self-pay | Admitting: Hematology

## 2024-03-03 ENCOUNTER — Other Ambulatory Visit (HOSPITAL_COMMUNITY): Payer: Self-pay

## 2024-03-03 NOTE — Progress Notes (Signed)
 Refill Request denied Patient needs Appointment. Called & spoke with patient & aware.

## 2024-03-04 ENCOUNTER — Other Ambulatory Visit: Payer: Self-pay

## 2024-03-04 ENCOUNTER — Encounter: Payer: Self-pay | Admitting: Hematology

## 2024-03-04 ENCOUNTER — Other Ambulatory Visit (HOSPITAL_COMMUNITY): Payer: Self-pay

## 2024-03-10 ENCOUNTER — Other Ambulatory Visit: Payer: Self-pay

## 2024-03-10 DIAGNOSIS — C49A2 Gastrointestinal stromal tumor of stomach: Secondary | ICD-10-CM

## 2024-03-10 DIAGNOSIS — D500 Iron deficiency anemia secondary to blood loss (chronic): Secondary | ICD-10-CM

## 2024-03-11 ENCOUNTER — Inpatient Hospital Stay: Attending: Hematology | Admitting: Hematology

## 2024-03-11 ENCOUNTER — Other Ambulatory Visit: Payer: Self-pay

## 2024-03-11 ENCOUNTER — Inpatient Hospital Stay

## 2024-03-11 ENCOUNTER — Other Ambulatory Visit (HOSPITAL_COMMUNITY): Payer: Self-pay

## 2024-03-11 VITALS — BP 140/62 | HR 87 | Temp 98.3°F | Resp 18 | Wt 130.6 lb

## 2024-03-11 DIAGNOSIS — Z85528 Personal history of other malignant neoplasm of kidney: Secondary | ICD-10-CM | POA: Diagnosis not present

## 2024-03-11 DIAGNOSIS — G629 Polyneuropathy, unspecified: Secondary | ICD-10-CM | POA: Insufficient documentation

## 2024-03-11 DIAGNOSIS — E538 Deficiency of other specified B group vitamins: Secondary | ICD-10-CM

## 2024-03-11 DIAGNOSIS — D509 Iron deficiency anemia, unspecified: Secondary | ICD-10-CM

## 2024-03-11 DIAGNOSIS — Z170 Estrogen receptor positive status [ER+]: Secondary | ICD-10-CM | POA: Diagnosis not present

## 2024-03-11 DIAGNOSIS — C49A2 Gastrointestinal stromal tumor of stomach: Secondary | ICD-10-CM

## 2024-03-11 DIAGNOSIS — D500 Iron deficiency anemia secondary to blood loss (chronic): Secondary | ICD-10-CM

## 2024-03-11 DIAGNOSIS — Z85028 Personal history of other malignant neoplasm of stomach: Secondary | ICD-10-CM | POA: Diagnosis present

## 2024-03-11 DIAGNOSIS — Z85828 Personal history of other malignant neoplasm of skin: Secondary | ICD-10-CM | POA: Diagnosis not present

## 2024-03-11 DIAGNOSIS — D649 Anemia, unspecified: Secondary | ICD-10-CM | POA: Insufficient documentation

## 2024-03-11 DIAGNOSIS — Z853 Personal history of malignant neoplasm of breast: Secondary | ICD-10-CM | POA: Diagnosis not present

## 2024-03-11 DIAGNOSIS — C50422 Malignant neoplasm of upper-outer quadrant of left male breast: Secondary | ICD-10-CM | POA: Diagnosis not present

## 2024-03-11 LAB — VITAMIN B12: Vitamin B-12: 151 pg/mL — ABNORMAL LOW (ref 180–914)

## 2024-03-11 LAB — IRON AND IRON BINDING CAPACITY (CC-WL,HP ONLY)
Iron: 33 ug/dL — ABNORMAL LOW (ref 45–182)
Saturation Ratios: 7 % — ABNORMAL LOW (ref 17.9–39.5)
TIBC: 448 ug/dL (ref 250–450)
UIBC: 415 ug/dL — ABNORMAL HIGH (ref 117–376)

## 2024-03-11 LAB — CMP (CANCER CENTER ONLY)
ALT: 9 U/L (ref 0–44)
AST: 17 U/L (ref 15–41)
Albumin: 3.7 g/dL (ref 3.5–5.0)
Alkaline Phosphatase: 21 U/L — ABNORMAL LOW (ref 38–126)
Anion gap: 4 — ABNORMAL LOW (ref 5–15)
BUN: 12 mg/dL (ref 8–23)
CO2: 29 mmol/L (ref 22–32)
Calcium: 8.4 mg/dL — ABNORMAL LOW (ref 8.9–10.3)
Chloride: 111 mmol/L (ref 98–111)
Creatinine: 0.95 mg/dL (ref 0.61–1.24)
GFR, Estimated: >60 mL/min (ref >60)
Glucose, Bld: 138 mg/dL — ABNORMAL HIGH (ref 70–99)
Potassium: 3.1 mmol/L — ABNORMAL LOW (ref 3.5–5.1)
Sodium: 144 mmol/L (ref 135–145)
Total Bilirubin: 0.7 mg/dL (ref 0.0–1.2)
Total Protein: 6.6 g/dL (ref 6.5–8.1)

## 2024-03-11 LAB — CBC WITH DIFFERENTIAL (CANCER CENTER ONLY)
Abs Immature Granulocytes: 0 10*3/uL (ref 0.00–0.07)
Basophils Absolute: 0.1 10*3/uL (ref 0.0–0.1)
Basophils Relative: 2 %
Eosinophils Absolute: 0.1 10*3/uL (ref 0.0–0.5)
Eosinophils Relative: 3 %
HCT: 29.6 % — ABNORMAL LOW (ref 39.0–52.0)
Hemoglobin: 9.6 g/dL — ABNORMAL LOW (ref 13.0–17.0)
Immature Granulocytes: 0 %
Lymphocytes Relative: 35 %
Lymphs Abs: 1.7 10*3/uL (ref 0.7–4.0)
MCH: 27.6 pg (ref 26.0–34.0)
MCHC: 32.4 g/dL (ref 30.0–36.0)
MCV: 85.1 fL (ref 80.0–100.0)
Monocytes Absolute: 0.6 10*3/uL (ref 0.1–1.0)
Monocytes Relative: 14 %
Neutro Abs: 2.2 10*3/uL (ref 1.7–7.7)
Neutrophils Relative %: 46 %
Platelet Count: 317 10*3/uL (ref 150–400)
RBC: 3.48 MIL/uL — ABNORMAL LOW (ref 4.22–5.81)
RDW: 16.2 % — ABNORMAL HIGH (ref 11.5–15.5)
WBC Count: 4.7 10*3/uL (ref 4.0–10.5)
nRBC: 0 % (ref 0.0–0.2)

## 2024-03-11 LAB — FERRITIN: Ferritin: 11 ng/mL — ABNORMAL LOW (ref 24–336)

## 2024-03-11 MED ORDER — IMATINIB MESYLATE 100 MG PO TABS
ORAL_TABLET | ORAL | 5 refills | Status: DC
Start: 2024-03-11 — End: 2024-09-10
  Filled 2024-03-11: qty 60, 30d supply, fill #0
  Filled 2024-04-14: qty 60, 30d supply, fill #1
  Filled 2024-05-11: qty 60, 30d supply, fill #2
  Filled 2024-06-05: qty 60, 30d supply, fill #3
  Filled 2024-07-10 – 2024-07-13 (×2): qty 60, 30d supply, fill #4

## 2024-03-11 NOTE — Assessment & Plan Note (Signed)
 stage I, er+/pr+/her2-, diagnosed in 2014, s/p lumpectomy  -He completed tamoxifen  20 mg daily in 2020.

## 2024-03-11 NOTE — Assessment & Plan Note (Addendum)
 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 -He had recurrent chest in liver in late 2012 with further surgery by Dr. Arline Laity in [facility].  Gleevec  restarted at low-dose 200 mg daily, and he has been on it since then. -He had stereotactic radiosurgery to the liver lesion at [facility] with a total of 5000 cGray in 5 fractions and completed in May 2016. -Last liver MRI with and without contrast in 03/2023 at [facility] showed similar posttreatment changes in segment 7 without evidence of progression, no new lesions. -He follows Dr. Francisco Irving at [facility], but requested to transfer his care to us  in 02/2024 -pt declined ctDNA test and wants to continue imatinib 

## 2024-03-12 ENCOUNTER — Encounter: Payer: Self-pay | Admitting: Hematology

## 2024-03-12 NOTE — Progress Notes (Signed)
 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 -He had recurrent chest in liver in late 2012 with further surgery by Dr. Arline Laity [facility].  Gleevec  restarted at low-dose 200 mg daily, and he has been on it since then. -He had stereotactic radiosurgery to the liver lesion at [facility] with a total of 5000 cGray in 5 fractions and completed in May 2016. -Last liver MRI with and without contrast in 03/2023 at [facility] showed similar posttreatment changes in segment 7 without evidence of progression, no new lesions. -He follows Dr. Francisco Irving at [facility], but requested to transfer his care to us  in 02/2024 -pt declined ctDNA test and wants to continue imatinib    Breast cancer of upper-outer quadrant of left male breast stage I, er+/pr+/her2-, diagnosed in 2014, s/p lumpectomy  -He completed tamoxifen  20 mg daily in 2020.   Assessment & Plan Metastatic GIST Currently on Gleevec  for management. Pt is concerned about recurrence, particularly in the liver. History of hepatotoxicity with Gleevec  managed with prednisone . He fears stopping Gleevec  due to past cancer spread during a treatment hiatus and prefers to continue  -he was under Dr. Sigurd Driver care before, but has not been followed for over a year, and now he wants to transfer his care to us . - Order CT scan of chest, abdomen, and pelvis to monitor for recurrence. -  Continue Gleevec  at 200 mg. - Schedule MRI of the liver in six months. - Provide six-month supply of Gleevec . - Discuss CT scan results in two to three weeks.  Anemia Chronic anemia with hemoglobin at 9.6 g/dL. Possible causes include vitamin B12 deficiency and low iron  levels. History of low B12 levels. Reports low red meat consumption, possibly contributing to low iron  levels. - Order blood tests for B12 and iron  levels. - Consider restarting B12 injections if levels are low. - Consider oral or IV iron  supplementation based on results.  Vitamin B12 deficiency Vitamin B12 deficiency with periodic injections. Currently not taking oral B12 supplements. Potential contributor to anemia. - Order blood test to assess B12 levels. - Restart B12 injections if levels are low.  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 Tolerable and not a major concern.   Plan - I reviewed the Gleevec , and ordered CT chest, abdomen and pelvis with contrast to be done in the next few weeks -Follow-up in six months for routine monitoring and management with lab and abdominal MRI 1 week before    Discussed the use of AI scribe software for clinical note transcription with the patient, who gave verbal consent to proceed.  History of Present Illness Henry [name] "Henry [name]" is a 73 year old male with anemia and B12 deficiency who presents for follow-up.  He has mild anemia with a hemoglobin level of 9.6 g/dL and J81 deficiency. He does not take oral B12 and sometimes experiences fatigue. His diet is low in red meat, favoring  seafood and chicken. He has received B12 injections previously.  He is on a regimen of 200 mg Gleevec  for cancer treatment, having experienced hepatotoxicity in the past, managed with prednisone . He is concerned about discontinuing Gleevec  due to previous cancer spread to the liver. His cancer history includes liver, breast, kidney, and skin cancer, with multiple  surgeries including a radio knife procedure for liver cancer. Genetic testing showed no specific pattern.  He has neuropathy in his foot, attributed to medication, but it is not bothersome. He has gained weight, now at 130 pounds. He has had reactions to iron  infusions, managed with premedication, and is open to oral iron  or infusions if needed.     All other systems were reviewed with the patient and are negative.  MEDICAL HISTORY:  Past Medical History:  Diagnosis Date   Anemia 2008   RESOLVED  WITH IV IRON  DEXTRAN AS HE WAS UNABLE  TO ABSORB  ORAL IRON .     B12 nutritional deficiency    RELATED TO PREVIOUS SURGRIES/ ON MONTHLY SUPPLEMENT   Breast cancer (HCC)    Cancer (HCC) NOVEMBER 2008   T1a PAPILLARY RENAL CELL CARCINOMA   Cataract    Depression NOTED IN NOTE 05-28-11   HISTORY OF MANIC  DEPRESSIVE TYPE DISORDER   Pneumothorax, spontaneous, tension 1992   TREATED WITH A CHEST TUBE   Stomach cancer (HCC) 12/31/2005   GASTROINTESTINAL STROMAL TUMOR (GIST)    SURGICAL HISTORY: Past Surgical History:  Procedure Laterality Date   AXILLARY SENTINEL NODE BIOPSY Left 10/14/2013   Procedure: sentinel lymph node mapping ;  Surgeon: Andy Bannister A. Cornett, MD;  Location: [facility];  Service: General;  Laterality: Left;   BILROTH II PROCEDURE  12-31-2005   CHOLECYSTECTOMY  12-31-2005   DONE WITH GASTRECTOMY BY DR. Andy Bannister CORNETT   PARTIAL NEPHRECTOMY  NOVEMBER 2008   RIGHT SIDE/ FOR T1a PAPILLARY RENAL CELL CARCINOMA   RE-EXCISION OF BREAST LUMPECTOMY Left 10/14/2013   Procedure: RE-EXCISION OF BREAST LUMPECTOMY;  Surgeon: Andy Bannister A. Cornett, MD;  Location: [facility];  Service: General;  Laterality: Left;   TUMOR REMOVAL  MARCH 2011   FOR RECURRENT GIST BY DR. LEVINE AT [facility]    I have reviewed the social history and family history with the patient and they are unchanged from previous note.  ALLERGIES:  is allergic to feraheme   [ferumoxytol ].  MEDICATIONS:  Current Outpatient Medications  Medication Sig Dispense Refill   Cyanocobalamin  (VITAMIN B-12 IJ) Inject 1,000 mcg as directed every 30 (thirty) days. Reported on 12/12/2015     ferrous sulfate 325 (65 FE) MG tablet Take 325 mg by mouth daily.     Iron -Vitamin C (VITRON-C PO) Take by mouth.     vitamin B-12 (CYANOCOBALAMIN ) 100 MCG tablet Take 1 tablet (100 mcg total) by mouth daily. 90 tablet 6   VITAMIN D PO Take by mouth. 5,000 3 x a week     imatinib  (GLEEVEC ) 100 MG tablet TAKE 2 TABLETS BY MOUTH DAILY. TAKE WITH MEALS AND LARGE GLASS OF WATER (CAUTION:CHEMO) 60 tablet 5   No current facility-administered medications for this visit.    PHYSICAL EXAMINATION: ECOG PERFORMANCE STATUS: 0 - Asymptomatic  Vitals:   03/11/24 1338  BP: (!) 140/62  Pulse: 87  Resp: 18  Temp: 98.3 F (36.8 C)  SpO2: 98%   Wt Readings from Last 3 Encounters:  03/11/24 130 lb 9.6 oz (59.2 kg)  01/10/23 119 lb 11.2 oz (54.3 kg)  07/11/22  119 lb (54 kg)     GENERAL:alert, no distress and comfortable SKIN: skin color, texture, turgor are normal, no rashes or significant lesions EYES: normal, Conjunctiva are pink and non-injected, sclera clear NECK: supple, thyroid normal size, non-tender, without nodularity LYMPH:  no palpable lymphadenopathy in the cervical, axillary  LUNGS: clear to auscultation and percussion with normal breathing effort HEART: regular rate & rhythm and no murmurs and no lower extremity edema ABDOMEN:abdomen soft, non-tender and normal bowel sounds Musculoskeletal:no cyanosis of digits and no clubbing  NEURO: alert & oriented x 3 with fluent speech, no focal motor/sensory deficits  Physical Exam MEASUREMENTS: Weight- 130.  LABORATORY DATA:  I have reviewed the data as listed    Latest Ref Rng & Units 03/11/2024    1:17 PM 01/10/2023    2:22 PM 07/11/2022    8:20 AM  CBC  WBC 4.0 - 10.5 K/uL 4.7  5.1  5.0   Hemoglobin 13.0 - 17.0 g/dL 9.6   62.1  30.8   Hematocrit 39.0 - 52.0 % 29.6  35.2  35.4   Platelets 150 - 400 K/uL 317  290  305         Latest Ref Rng & Units 03/11/2024    1:17 PM 01/10/2023    2:22 PM 07/11/2022    8:20 AM  CMP  Glucose 70 - 99 mg/dL 657  846  962   BUN 8 - 23 mg/dL 12  10  10    Creatinine 0.61 - 1.24 mg/dL 9.52  8.41  3.24   Sodium 135 - 145 mmol/L 144  143  143   Potassium 3.5 - 5.1 mmol/L 3.1  3.3  3.4   Chloride 98 - 111 mmol/L 111  107  107   CO2 22 - 32 mmol/L 29  31  31    Calcium 8.9 - 10.3 mg/dL 8.4  8.6  8.9   Total Protein 6.5 - 8.1 g/dL 6.6  6.5  6.8   Total Bilirubin 0.0 - 1.2 mg/dL 0.7  1.1  1.3   Alkaline Phos 38 - 126 U/L 21  28  27    AST 15 - 41 U/L 17  16  17    ALT 0 - 44 U/L 9  8  8        RADIOGRAPHIC STUDIES: I have personally reviewed the radiological images as listed and agreed with the findings in the report. No results found.    Orders Placed This Encounter  Procedures   CT CHEST ABDOMEN PELVIS W CONTRAST    Standing Status:   Future    Expected Date:   03/25/2024    Expiration Date:   03/11/2025    If indicated for the ordered procedure, I authorize the administration of contrast media per Radiology protocol:   Yes    Does the patient have a contrast media/X-ray dye allergy?:   Yes    Preferred imaging location?:   [facility]    If indicated for the ordered procedure, I authorize the administration of oral contrast media per Radiology protocol:   Yes   MR Abdomen W Wo Contrast    Standing Status:   Future    Expected Date:   09/03/2024    Expiration Date:   03/11/2025    If indicated for the ordered procedure, I authorize the administration of contrast media per Radiology protocol:   Yes    What is the patient's sedation requirement?:   No Sedation    Does the patient have  a pacemaker or implanted devices?:   No    Preferred imaging location?:   [facility] (table limit - 550 lbs)   All questions were answered. The patient knows to call the  clinic with any problems, questions or concerns. No barriers to learning was detected. The total time spent in the appointment was 40 minutes, including review of chart and various tests results, discussions about plan of care and coordination of care plan     Sonja Macungie, MD 03/12/2024

## 2024-03-16 ENCOUNTER — Other Ambulatory Visit: Payer: Self-pay | Admitting: Hematology

## 2024-03-16 ENCOUNTER — Ambulatory Visit: Payer: Self-pay | Admitting: Hematology

## 2024-03-16 MED ORDER — POTASSIUM CHLORIDE CRYS ER 20 MEQ PO TBCR: 20.0000 meq | EXTENDED_RELEASE_TABLET | Freq: Two times a day (BID) | ORAL | 0 refills | Status: DC

## 2024-03-17 ENCOUNTER — Other Ambulatory Visit: Payer: Self-pay | Admitting: Hematology

## 2024-03-18 ENCOUNTER — Encounter: Payer: Self-pay | Admitting: Hematology

## 2024-03-18 ENCOUNTER — Other Ambulatory Visit: Payer: Self-pay

## 2024-03-18 NOTE — Telephone Encounter (Addendum)
 Called patient ot relay message below as per Dr. Maryalice Smaller, patient stated he seen his results and has picked up the rx for potassium and B12. I told him to be waiting for a call from scheduling to set up his infusions and B12 injections. Patient had no further questions at this time.    ----- Message from Sonja Alvord sent at 03/16/2024  4:56 PM EDT ----- Please let pt know his lab results, B12 level very low, iron  and K levels are also low. I recommend B12 injection weeklyX4 then monthly for next 6 months, iv venofer  200mg  weeklyX5 at [facility] (hope they can give B12 injection also), I will call in [facility] for him and let him start twice daily for a week then once dailyX2, repeat lab in a month with phone visit a few days after (will also review his CT result on next virtual visit, thanks   Sonja [place]  03/16/2024

## 2024-03-19 ENCOUNTER — Other Ambulatory Visit: Payer: Self-pay

## 2024-03-25 ENCOUNTER — Ambulatory Visit (HOSPITAL_COMMUNITY)
Admission: RE | Admit: 2024-03-25 | Discharge: 2024-03-25 | Disposition: A | Discharge status: Home / Self Care | Source: Ambulatory Visit | Attending: Hematology | Admitting: Hematology

## 2024-03-25 DIAGNOSIS — C49A2 Gastrointestinal stromal tumor of stomach: Secondary | ICD-10-CM | POA: Diagnosis present

## 2024-03-25 MED ORDER — IOHEXOL 300 MG/ML  SOLN
100.0000 mL | Freq: Once | INTRAMUSCULAR | Status: AC | PRN
  Administered 2024-03-25: 100 mL via INTRAVENOUS

## 2024-03-27 ENCOUNTER — Encounter: Payer: Self-pay | Admitting: Hematology

## 2024-04-10 ENCOUNTER — Other Ambulatory Visit: Payer: Self-pay

## 2024-04-12 NOTE — Assessment & Plan Note (Signed)
 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 -He had recurrent chest in liver in late 2012 with further surgery by Dr. Arline Laity in [facility].  Gleevec  restarted at low-dose 200 mg daily, and he has been on it since then. -He had stereotactic radiosurgery to the liver lesion at [facility] with a total of 5000 cGray in 5 fractions and completed in May 2016. -Last liver MRI with and without contrast in 03/2023 at [facility] showed similar posttreatment changes in segment 7 without evidence of progression, no new lesions. -He follows Dr. Francisco Irving at [facility], but requested to transfer his care to us  in 02/2024 -pt declined ctDNA test and wants to continue imatinib 

## 2024-04-12 NOTE — Assessment & Plan Note (Signed)
 stage I, er+/pr+/her2-, diagnosed in 2014, s/p lumpectomy  -He completed tamoxifen  20 mg daily in 2020.

## 2024-04-13 ENCOUNTER — Inpatient Hospital Stay: Attending: Hematology | Admitting: Hematology

## 2024-04-13 ENCOUNTER — Telehealth: Payer: Self-pay

## 2024-04-13 DIAGNOSIS — D509 Iron deficiency anemia, unspecified: Secondary | ICD-10-CM | POA: Diagnosis not present

## 2024-04-13 DIAGNOSIS — C50422 Malignant neoplasm of upper-outer quadrant of left male breast: Secondary | ICD-10-CM

## 2024-04-13 DIAGNOSIS — C49A2 Gastrointestinal stromal tumor of stomach: Secondary | ICD-10-CM

## 2024-04-13 DIAGNOSIS — D519 Vitamin B12 deficiency anemia, unspecified: Secondary | ICD-10-CM | POA: Insufficient documentation

## 2024-04-13 DIAGNOSIS — Z8509 Personal history of malignant neoplasm of other digestive organs: Secondary | ICD-10-CM | POA: Insufficient documentation

## 2024-04-13 DIAGNOSIS — Z170 Estrogen receptor positive status [ER+]: Secondary | ICD-10-CM | POA: Diagnosis not present

## 2024-04-13 NOTE — Telephone Encounter (Signed)
 Hello,  Auth Submission: NO AUTH NEEDED Site of care: Site of care: [facility] Payer: [employer] medicare Medication & CPT/J Code(s) submitted: Venofer  (Iron  Sucrose) J1756 Diagnosis Code:  Route of submission (phone, fax, portal): portal Phone # Fax # Auth type: Buy/Bill PB Units/visits requested: 200mg , 5 doses Reference number: [id number] Approval from: 04/13/24 to 10/21/24

## 2024-04-13 NOTE — Progress Notes (Signed)
 [facility]   Telephone:([phone] Fax:(336) [phone]   Clinic Follow up Note   Patient Care Team: Henry Debby PARAS, MD as PCP - Henry Lanny Callander, MD as Attending Physician (Hematology and Oncology) 04/13/2024  I connected with Henry [name] on 04/13/24 at  8:15 AM EDT by telephone and verified that I am speaking with the correct person using two identifiers.   I discussed the limitations, risks, security and privacy concerns of performing an evaluation and management service by telephone and the availability of in person appointments. I also discussed with the patient that there may be a patient responsible charge related to this service. The patient expressed understanding and agreed to proceed.   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 -He had recurrent chest in liver in late 2012 with further surgery by Henry [name] in [facility].  Gleevec  restarted at low-dose 200 mg daily, and he has been on it since then. -He had stereotactic radiosurgery to the liver lesion at [facility] with a total of 5000 cGray in 5 fractions and completed in May 2016. -Last liver MRI with and without contrast in 03/2023 at [facility] showed similar posttreatment changes in segment 7 without evidence of progression, no new lesions. -He follows Dr. Marci at [facility], but  requested to transfer his care to us  in 02/2024 -pt declined ctDNA test and wants to continue imatinib     Assessment & Plan Vitamin B12 deficiency anemia Chronic vitamin B12 deficiency anemia likely due to malabsorption post-gastrectomy. Current B12 level is 151, below the target of 300. Oral B12 supplementation is ineffective, necessitating injectable B12. - Administer weekly B12 injections for two months, then transition to monthly injections. - Monitor B12 levels and adjust injection frequency if levels become elevated.  Iron  deficiency anemia Chronic iron  deficiency anemia with previous allergic reaction to Infed . Currently on oral iron  (65 mg daily). Tolerated Feraheme  infusion in 2021 without issues and is agreeable to receiving it again. - Schedule iv venofer  infusion at [facility], pending insurance approval. - Repeat lab tests in one month to assess response to oral iron . - Follow up in two months to evaluate resolution of anemia.   Metastatic gastric GIST - I reviewed his restaging CT scan from March 25, 2024, which showed no evidence of disease - Will continue Gleevec  at low-dose 200 mg daily  Plan - Lab and CT scan reviewed - Will start a B12 injection weekly x 8, then monthly - IV Venofer  200mg  with premedication weekly x 5 - Repeat labs in 4 weeks, lab and follow-up in 8 weeks   Discussed the use of AI scribe software for clinical note transcription with the patient, who gave verbal consent to proceed.  History of Present Illness Henry [name] is a 73 year old male with metastatic cyst and B12 deficient anemia who presents for follow-up.  He takes  oral B12 daily, but his recent lab results show a low B12 level of 151. He has a history of stomach surgery and previously received B12 injections, with the last one approximately two years ago.  His current hemoglobin level is 9.6, and his iron  level is low. He started taking oral iron  supplements,  65 mg daily, a month ago without any issues. He experienced an allergic reaction to an iron  infusion about ten years ago, resulting in hives, but has since tolerated iron  infusions well, with the last one in 2021 without any issues.     REVIEW OF SYSTEMS:   Constitutional: Denies fevers, chills or abnormal weight loss Eyes: Denies blurriness of vision Ears, nose, mouth, throat, and face: Denies mucositis or sore throat Respiratory: Denies cough, dyspnea or wheezes Cardiovascular: Denies palpitation, chest discomfort or lower extremity swelling Gastrointestinal:  Denies nausea, heartburn or change in bowel habits Skin: Denies abnormal skin rashes Lymphatics: Denies new lymphadenopathy or easy bruising Neurological:Denies numbness, tingling or new weaknesses Behavioral/Psych: Mood is stable, no new changes  All other systems were reviewed with the patient and are negative.  MEDICAL HISTORY:  Past Medical History:  Diagnosis Date   Anemia 2008   RESOLVED  WITH IV IRON  DEXTRAN AS HE WAS UNABLE  TO ABSORB  ORAL IRON .     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 Cornett, MD;  Location: [facility];  Service: General;  Laterality: Left;   BILROTH II PROCEDURE  12-31-2005   CHOLECYSTECTOMY  12-31-2005   DONE WITH GASTRECTOMY BY DR. DEBBY CORNETT   PARTIAL NEPHRECTOMY  NOVEMBER 2008   RIGHT SIDE/ FOR T1a PAPILLARY RENAL CELL  CARCINOMA   RE-EXCISION OF BREAST LUMPECTOMY Left 10/14/2013   Procedure: RE-EXCISION OF BREAST LUMPECTOMY;  Surgeon: Debby A. Cornett, MD;  Location: [facility];  Service: General;  Laterality: Left;   TUMOR REMOVAL  MARCH 2011   FOR RECURRENT GIST BY DR. LEVINE AT [facility]    I have reviewed the social history and family history with the patient and they are unchanged from previous note.  ALLERGIES:  is allergic to feraheme  [ferumoxytol ].  MEDICATIONS:  Current Outpatient Medications  Medication Sig Dispense Refill   Cyanocobalamin  (VITAMIN B-12 IJ) Inject 1,000 mcg as directed every 30 (thirty) days. Reported on 12/12/2015     ferrous sulfate 325 (65 FE) MG tablet Take 325 mg by mouth daily.     imatinib  (GLEEVEC ) 100 MG tablet TAKE 2 TABLETS BY MOUTH DAILY. TAKE WITH MEALS AND LARGE GLASS OF WATER (CAUTION:CHEMO) 60 tablet 5   Iron -Vitamin C (VITRON-C PO) Take by mouth.     potassium chloride  SA (KLOR-CON  M) 20 MEQ tablet TAKE 1 TABLET BY MOUTH 2 TIMES DAILY. CHANGE TO ONE TABLET DAILY AFTER ONE WEEK UNTIL COMPLETE THE BOTTLE 117 tablet 1   vitamin B-12 (CYANOCOBALAMIN ) 100 MCG tablet Take 1 tablet (100 mcg  total) by mouth daily. 90 tablet 6   VITAMIN D PO Take by mouth. 5,000 3 x a week     No current facility-administered medications for this visit.    PHYSICAL EXAMINATION: Not performed   LABORATORY DATA:  I have reviewed the data as listed    Latest Ref Rng & Units 03/11/2024    1:17 PM 01/10/2023    2:22 PM 07/11/2022    8:20 AM  CBC  WBC 4.0 - 10.5 K/uL 4.7  5.1  5.0   Hemoglobin 13.0 - 17.0 g/dL 9.6  88.0  88.3   Hematocrit 39.0 - 52.0 % 29.6  35.2  35.4   Platelets 150 - 400 K/uL 317  290  305         Latest Ref Rng & Units 03/11/2024    1:17 PM 01/10/2023    2:22 PM 07/11/2022    8:20 AM  CMP  Glucose 70 - 99 mg/dL 861  896  880   BUN 8 - 23 mg/dL 12  10  10    Creatinine 0.61 - 1.24 mg/dL 9.04  8.95  8.96   Sodium 135 - 145 mmol/L 144   143  143   Potassium 3.5 - 5.1 mmol/L 3.1  3.3  3.4   Chloride 98 - 111 mmol/L 111  107  107   CO2 22 - 32 mmol/L 29  31  31    Calcium 8.9 - 10.3 mg/dL 8.4  8.6  8.9   Total Protein 6.5 - 8.1 g/dL 6.6  6.5  6.8   Total Bilirubin 0.0 - 1.2 mg/dL 0.7  1.1  1.3   Alkaline Phos 38 - 126 U/L 21  28  27    AST 15 - 41 U/L 17  16  17    ALT 0 - 44 U/L 9  8  8        RADIOGRAPHIC STUDIES: I have personally reviewed the radiological images as listed and agreed with the findings in the report. No results found.     I discussed the assessment and treatment plan with the patient. The patient was provided an opportunity to ask questions and all were answered. The patient agreed with the plan and demonstrated an understanding of the instructions.   The patient was advised to call back or seek an in-person evaluation if the symptoms worsen or if the condition fails to improve as anticipated.  I provided 25 minutes of non face-to-face telephone visit time during this encounter, including review of chart and various tests results, discussions about plan of care and coordination of care plan.    Onita Mattock, MD 04/13/24

## 2024-04-14 ENCOUNTER — Other Ambulatory Visit: Payer: Self-pay

## 2024-04-14 ENCOUNTER — Other Ambulatory Visit (HOSPITAL_COMMUNITY): Payer: Self-pay

## 2024-04-14 NOTE — Progress Notes (Signed)
 Specialty Pharmacy Ongoing Clinical Assessment Note  Henry [name] is a 73 y.o. male who is being followed by the specialty pharmacy service for RxSp Oncology   Patient's specialty medication(s) reviewed today: Imatinib  Mesylate (GLEEVEC )   Missed doses in the last 4 weeks: 2   Patient/Caregiver did not have any additional questions or concerns.   Therapeutic benefit summary: Patient is achieving benefit   Adverse events/side effects summary: Experienced adverse events/side effects (neuropathy of feet, unchanged)   Patient's therapy is appropriate to: Continue    Goals Addressed             This Visit's Progress    Improve or maintain quality of life       Patient is on track. Patient will maintain adherence.  CT from March 25, 2024 showed no evidence of disease per Dr. Lanny.       Slow Disease Progression   On track    Patient is on track. Patient will maintain adherence         Follow up: 6 months  [name] Specialty Pharmacist

## 2024-04-14 NOTE — Progress Notes (Signed)
 Specialty Pharmacy Refill Coordination Note  Henry [name] is a 73 y.o. male contacted today regarding refills of specialty medication(s) Imatinib  Mesylate (GLEEVEC )   Patient requested Delivery   Delivery date: 04/22/24   Verified address: [address] [place] [address]   Medication will be filled on 04/21/24.

## 2024-04-15 ENCOUNTER — Ambulatory Visit

## 2024-04-15 VITALS — BP 122/65 | HR 62 | Temp 98.6°F | Resp 20 | Ht 72.0 in | Wt 127.4 lb

## 2024-04-15 DIAGNOSIS — K922 Gastrointestinal hemorrhage, unspecified: Secondary | ICD-10-CM

## 2024-04-15 DIAGNOSIS — D500 Iron deficiency anemia secondary to blood loss (chronic): Secondary | ICD-10-CM | POA: Diagnosis not present

## 2024-04-15 MED ORDER — ACETAMINOPHEN 325 MG PO TABS
650.0000 mg | ORAL_TABLET | Freq: Once | ORAL | Status: AC
  Administered 2024-04-15: 650 mg via ORAL
  Filled 2024-04-15: qty 2

## 2024-04-15 MED ORDER — DIPHENHYDRAMINE HCL 25 MG PO CAPS
25.0000 mg | ORAL_CAPSULE | Freq: Once | ORAL | Status: AC
  Administered 2024-04-15: 25 mg via ORAL
  Filled 2024-04-15: qty 1

## 2024-04-15 MED ORDER — IRON SUCROSE 20 MG/ML IV SOLN
200.0000 mg | Freq: Once | INTRAVENOUS | Status: AC
  Administered 2024-04-15: 200 mg via INTRAVENOUS
  Filled 2024-04-15: qty 10

## 2024-04-15 NOTE — Progress Notes (Signed)
 Diagnosis: Iron Deficiency Anemia  Provider:  Chilton Greathouse MD  Procedure: IV Push  IV Type: Peripheral, IV Location: L Antecubital  Venofer (Iron Sucrose), Dose: 200 mg  Post Infusion IV Care: Observation period completed and Peripheral IV Discontinued  Discharge: Condition: Stable, Destination: Home . AVS Declined  Performed by:  Wyvonne Lenz, RN

## 2024-04-17 ENCOUNTER — Ambulatory Visit (INDEPENDENT_AMBULATORY_CARE_PROVIDER_SITE_OTHER): Admitting: *Deleted

## 2024-04-17 VITALS — BP 124/74 | HR 60 | Temp 98.0°F | Resp 18 | Ht 72.0 in | Wt 129.4 lb

## 2024-04-17 DIAGNOSIS — D500 Iron deficiency anemia secondary to blood loss (chronic): Secondary | ICD-10-CM

## 2024-04-17 DIAGNOSIS — K922 Gastrointestinal hemorrhage, unspecified: Secondary | ICD-10-CM

## 2024-04-17 MED ORDER — ACETAMINOPHEN 325 MG PO TABS
650.0000 mg | ORAL_TABLET | Freq: Once | ORAL | Status: AC
  Administered 2024-04-17: 650 mg via ORAL
  Filled 2024-04-17: qty 2

## 2024-04-17 MED ORDER — IRON SUCROSE 20 MG/ML IV SOLN
200.0000 mg | Freq: Once | INTRAVENOUS | Status: AC
  Administered 2024-04-17: 200 mg via INTRAVENOUS
  Filled 2024-04-17: qty 10

## 2024-04-17 MED ORDER — DIPHENHYDRAMINE HCL 25 MG PO CAPS
25.0000 mg | ORAL_CAPSULE | Freq: Once | ORAL | Status: AC
  Administered 2024-04-17: 25 mg via ORAL
  Filled 2024-04-17: qty 1

## 2024-04-17 NOTE — Progress Notes (Signed)
 Diagnosis: Iron Deficiency Anemia  Provider:  Chilton Greathouse MD  Procedure: IV Push  IV Type: Peripheral, IV Location: L Antecubital  Venofer (Iron Sucrose), Dose: 200 mg  Post Infusion IV Care: Observation period completed and Peripheral IV Discontinued  Discharge: Condition: Good, Destination: Home . AVS Declined  Performed by:  Forrest Moron, RN

## 2024-04-20 ENCOUNTER — Ambulatory Visit

## 2024-04-20 ENCOUNTER — Other Ambulatory Visit: Payer: Self-pay | Admitting: Nurse Practitioner

## 2024-04-20 VITALS — BP 134/67 | HR 69 | Temp 97.7°F | Resp 16 | Ht 72.0 in | Wt 127.0 lb

## 2024-04-20 DIAGNOSIS — D500 Iron deficiency anemia secondary to blood loss (chronic): Secondary | ICD-10-CM | POA: Diagnosis not present

## 2024-04-20 DIAGNOSIS — K922 Gastrointestinal hemorrhage, unspecified: Secondary | ICD-10-CM

## 2024-04-20 MED ORDER — DIPHENHYDRAMINE HCL 25 MG PO CAPS
25.0000 mg | ORAL_CAPSULE | Freq: Once | ORAL | Status: AC
  Administered 2024-04-20: 25 mg via ORAL
  Filled 2024-04-20: qty 1

## 2024-04-20 MED ORDER — ACETAMINOPHEN 325 MG PO TABS
650.0000 mg | ORAL_TABLET | Freq: Once | ORAL | Status: AC
  Administered 2024-04-20: 650 mg via ORAL
  Filled 2024-04-20: qty 2

## 2024-04-20 MED ORDER — IRON SUCROSE 20 MG/ML IV SOLN
200.0000 mg | Freq: Once | INTRAVENOUS | Status: AC
  Administered 2024-04-20: 200 mg via INTRAVENOUS
  Filled 2024-04-20: qty 10

## 2024-04-20 NOTE — Progress Notes (Signed)
 Diagnosis: Iron  Deficiency Anemia  Provider:  Praveen Mannam MD  Procedure: IV Push  IV Type: Peripheral, IV Location: R Antecubital  Venofer  (Iron  Sucrose), Dose: 200 mg  Post Infusion IV Care: Peripheral IV Discontinued  Discharge: Condition: Good, Destination: Home . AVS Declined  Performed by:  Leita FORBES Miles, LPN

## 2024-04-21 ENCOUNTER — Other Ambulatory Visit: Payer: Self-pay

## 2024-04-21 NOTE — Progress Notes (Signed)
 Patient was left a voice mail for updated payment information.  A Mychart message was sent as well.

## 2024-04-22 ENCOUNTER — Ambulatory Visit

## 2024-04-22 VITALS — BP 134/79 | HR 55 | Temp 97.9°F | Resp 16 | Ht 72.0 in | Wt 124.4 lb

## 2024-04-22 DIAGNOSIS — K922 Gastrointestinal hemorrhage, unspecified: Secondary | ICD-10-CM

## 2024-04-22 DIAGNOSIS — D500 Iron deficiency anemia secondary to blood loss (chronic): Secondary | ICD-10-CM | POA: Diagnosis not present

## 2024-04-22 MED ORDER — ACETAMINOPHEN 325 MG PO TABS
650.0000 mg | ORAL_TABLET | Freq: Once | ORAL | Status: AC
  Administered 2024-04-22: 650 mg via ORAL
  Filled 2024-04-22: qty 2

## 2024-04-22 MED ORDER — IRON SUCROSE 20 MG/ML IV SOLN
200.0000 mg | Freq: Once | INTRAVENOUS | Status: AC
  Administered 2024-04-22: 200 mg via INTRAVENOUS
  Filled 2024-04-22: qty 10

## 2024-04-22 MED ORDER — DIPHENHYDRAMINE HCL 25 MG PO CAPS
25.0000 mg | ORAL_CAPSULE | Freq: Once | ORAL | Status: AC
  Administered 2024-04-22: 25 mg via ORAL
  Filled 2024-04-22: qty 1

## 2024-04-22 NOTE — Progress Notes (Signed)
 Diagnosis: Iron Deficiency Anemia  Provider:  Chilton Greathouse MD  Procedure: IV Push  IV Type: Peripheral, IV Location: R Antecubital  Venofer (Iron Sucrose), Dose: 200 mg  Post Infusion IV Care: Observation period completed and Peripheral IV Discontinued  Discharge: Condition: Good, Destination: Home . AVS Declined  Performed by:  Rico Ala, LPN

## 2024-04-27 ENCOUNTER — Ambulatory Visit: Admitting: *Deleted

## 2024-04-27 ENCOUNTER — Telehealth: Payer: Self-pay | Admitting: Hematology

## 2024-04-27 VITALS — BP 133/66 | HR 66 | Temp 98.1°F | Resp 16 | Ht 72.0 in | Wt 123.2 lb

## 2024-04-27 DIAGNOSIS — K922 Gastrointestinal hemorrhage, unspecified: Secondary | ICD-10-CM | POA: Diagnosis not present

## 2024-04-27 DIAGNOSIS — D500 Iron deficiency anemia secondary to blood loss (chronic): Secondary | ICD-10-CM | POA: Diagnosis not present

## 2024-04-27 MED ORDER — IRON SUCROSE 20 MG/ML IV SOLN
200.0000 mg | Freq: Once | INTRAVENOUS | Status: AC
  Administered 2024-04-27: 200 mg via INTRAVENOUS
  Filled 2024-04-27: qty 10

## 2024-04-27 MED ORDER — DIPHENHYDRAMINE HCL 25 MG PO CAPS
25.0000 mg | ORAL_CAPSULE | Freq: Once | ORAL | Status: DC
  Filled 2024-04-27: qty 1

## 2024-04-27 MED ORDER — ACETAMINOPHEN 325 MG PO TABS
650.0000 mg | ORAL_TABLET | Freq: Once | ORAL | Status: DC
Start: 2024-04-27 — End: 2024-04-27
  Filled 2024-04-27: qty 2

## 2024-04-27 NOTE — Telephone Encounter (Signed)
 Scheduled appointments per 6/23 los. Called and left a VM with appointment details for the patient.

## 2024-04-27 NOTE — Progress Notes (Signed)
 Diagnosis: Iron Deficiency Anemia  Provider:  Chilton Greathouse MD  Procedure: IV Push  IV Type: Peripheral, IV Location: R Antecubital  Venofer (Iron Sucrose), Dose: 200 mg  Post Infusion IV Care: Observation period completed and Peripheral IV Discontinued  Discharge: Condition: Good, Destination: Home . AVS Declined  Performed by:  Forrest Moron, RN

## 2024-05-01 ENCOUNTER — Inpatient Hospital Stay: Attending: Hematology

## 2024-05-01 ENCOUNTER — Other Ambulatory Visit: Payer: Self-pay

## 2024-05-01 DIAGNOSIS — E538 Deficiency of other specified B group vitamins: Secondary | ICD-10-CM

## 2024-05-01 DIAGNOSIS — D519 Vitamin B12 deficiency anemia, unspecified: Secondary | ICD-10-CM | POA: Insufficient documentation

## 2024-05-01 MED ORDER — CYANOCOBALAMIN 1000 MCG/ML IJ SOLN
1000.0000 ug | Freq: Once | INTRAMUSCULAR | Status: AC
  Administered 2024-05-01: 1000 ug via INTRAMUSCULAR
  Filled 2024-05-01: qty 1

## 2024-05-06 ENCOUNTER — Other Ambulatory Visit: Payer: Self-pay

## 2024-05-06 MED ORDER — CYANOCOBALAMIN 1000 MCG/ML IJ SOLN: 1000.0000 ug | INTRAMUSCULAR | 4 refills | Status: AC

## 2024-05-06 NOTE — Progress Notes (Signed)
 Pt called requesting to cancel his appts for B12 injections weekly.  Pt stated he lives in [place] and would prefer if the prescription could be sent to his preferred pharmacy in [place], [place].  Pt stated his PCP has agreed to administer his B12 injections.  Sent prescription for B12 to pt's preferred pharmacy.

## 2024-05-08 ENCOUNTER — Inpatient Hospital Stay

## 2024-05-11 ENCOUNTER — Encounter (INDEPENDENT_AMBULATORY_CARE_PROVIDER_SITE_OTHER): Payer: Self-pay

## 2024-05-11 ENCOUNTER — Other Ambulatory Visit: Payer: Self-pay

## 2024-05-11 NOTE — Progress Notes (Signed)
 Specialty Pharmacy Refill Coordination Note  Henry [name] is a 73 y.o. male contacted today regarding refills of specialty medication(s) Imatinib  Mesylate (GLEEVEC )   Patient requested (Patient-Rptd) Delivery   Delivery date: 05/14/24   Verified address: (Patient-Rptd) [address], [place] [address]   Medication will be filled on 07.23.25.

## 2024-05-12 ENCOUNTER — Other Ambulatory Visit: Payer: Self-pay

## 2024-05-15 ENCOUNTER — Inpatient Hospital Stay

## 2024-05-22 ENCOUNTER — Ambulatory Visit

## 2024-05-22 ENCOUNTER — Inpatient Hospital Stay

## 2024-05-29 ENCOUNTER — Inpatient Hospital Stay

## 2024-06-05 ENCOUNTER — Inpatient Hospital Stay

## 2024-06-05 ENCOUNTER — Other Ambulatory Visit: Payer: Self-pay

## 2024-06-05 NOTE — Progress Notes (Signed)
 Specialty Pharmacy Refill Coordination Note  Henry [name] is a 73 y.o. male contacted today regarding refills of specialty medication(s) Imatinib  Mesylate (GLEEVEC )   Patient requested Delivery   Delivery date: 06/15/24   Verified address: [address], [place], [address]   Medication will be filled on 06/12/24.

## 2024-06-12 ENCOUNTER — Other Ambulatory Visit (HOSPITAL_COMMUNITY): Payer: Self-pay

## 2024-06-12 ENCOUNTER — Other Ambulatory Visit: Payer: Self-pay

## 2024-06-12 ENCOUNTER — Inpatient Hospital Stay

## 2024-06-18 ENCOUNTER — Telehealth: Payer: Self-pay

## 2024-06-18 ENCOUNTER — Other Ambulatory Visit

## 2024-06-18 ENCOUNTER — Inpatient Hospital Stay: Attending: Hematology | Admitting: Hematology

## 2024-06-18 ENCOUNTER — Inpatient Hospital Stay

## 2024-06-18 NOTE — Assessment & Plan Note (Deleted)
 Chronic vitamin B12 deficiency anemia likely due to malabsorption post-gastrectomy.  -his B12 level was 151 in May 2025, I restarted B12 injections with loading dose  -will monitor

## 2024-06-18 NOTE — Telephone Encounter (Signed)
 LVM for pt stating he had appts today for lab, f/u Dr. Lanny, and B12 injection.  Stated the appt will be NO Showed for today.  Instructed pt to contact [facility] Scheduling Team to reschedule the appts.

## 2024-06-18 NOTE — Assessment & Plan Note (Deleted)
 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 -He had recurrent chest in liver in late 2012 with further surgery by Dr. Arline Laity in [facility].  Gleevec  restarted at low-dose 200 mg daily, and he has been on it since then. -He had stereotactic radiosurgery to the liver lesion at [facility] with a total of 5000 cGray in 5 fractions and completed in May 2016. -Last liver MRI with and without contrast in 03/2023 at [facility] showed similar posttreatment changes in segment 7 without evidence of progression, no new lesions. -He follows Dr. Francisco Irving at [facility], but requested to transfer his care to us  in 02/2024 -pt declined ctDNA test and wants to continue imatinib 

## 2024-06-19 ENCOUNTER — Telehealth: Payer: Self-pay | Admitting: Hematology

## 2024-06-19 NOTE — Telephone Encounter (Signed)
 LVM   regardin  his appts

## 2024-06-22 NOTE — Assessment & Plan Note (Signed)
 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 -He had recurrent chest in liver in late 2012 with further surgery by Dr. Arline Laity in [facility].  Gleevec  restarted at low-dose 200 mg daily, and he has been on it since then. -He had stereotactic radiosurgery to the liver lesion at [facility] with a total of 5000 cGray in 5 fractions and completed in May 2016. -Last liver MRI with and without contrast in 03/2023 at [facility] showed similar posttreatment changes in segment 7 without evidence of progression, no new lesions. -He follows Dr. Francisco Irving at [facility], but requested to transfer his care to us  in 02/2024 -pt declined ctDNA test and wants to continue imatinib 

## 2024-06-23 ENCOUNTER — Inpatient Hospital Stay

## 2024-06-23 ENCOUNTER — Encounter: Payer: Self-pay | Admitting: Hematology

## 2024-06-23 ENCOUNTER — Inpatient Hospital Stay: Attending: Hematology

## 2024-06-23 ENCOUNTER — Inpatient Hospital Stay (HOSPITAL_BASED_OUTPATIENT_CLINIC_OR_DEPARTMENT_OTHER): Admitting: Hematology

## 2024-06-23 VITALS — BP 126/64 | HR 71 | Temp 98.8°F | Resp 17 | Ht 72.0 in | Wt 123.5 lb

## 2024-06-23 DIAGNOSIS — D509 Iron deficiency anemia, unspecified: Secondary | ICD-10-CM | POA: Diagnosis not present

## 2024-06-23 DIAGNOSIS — G629 Polyneuropathy, unspecified: Secondary | ICD-10-CM | POA: Insufficient documentation

## 2024-06-23 DIAGNOSIS — E538 Deficiency of other specified B group vitamins: Secondary | ICD-10-CM | POA: Insufficient documentation

## 2024-06-23 DIAGNOSIS — L0292 Furuncle, unspecified: Secondary | ICD-10-CM | POA: Insufficient documentation

## 2024-06-23 DIAGNOSIS — C49A2 Gastrointestinal stromal tumor of stomach: Secondary | ICD-10-CM | POA: Diagnosis not present

## 2024-06-23 DIAGNOSIS — L739 Follicular disorder, unspecified: Secondary | ICD-10-CM | POA: Diagnosis not present

## 2024-06-23 DIAGNOSIS — D500 Iron deficiency anemia secondary to blood loss (chronic): Secondary | ICD-10-CM

## 2024-06-23 LAB — CBC WITH DIFFERENTIAL (CANCER CENTER ONLY)
Abs Immature Granulocytes: 0.01 K/uL (ref 0.00–0.07)
Basophils Absolute: 0.1 K/uL (ref 0.0–0.1)
Basophils Relative: 1 %
Eosinophils Absolute: 0.1 K/uL (ref 0.0–0.5)
Eosinophils Relative: 2 %
HCT: 36.5 % — ABNORMAL LOW (ref 39.0–52.0)
Hemoglobin: 12 g/dL — ABNORMAL LOW (ref 13.0–17.0)
Immature Granulocytes: 0 %
Lymphocytes Relative: 33 %
Lymphs Abs: 1.7 K/uL (ref 0.7–4.0)
MCH: 30.5 pg (ref 26.0–34.0)
MCHC: 32.9 g/dL (ref 30.0–36.0)
MCV: 92.6 fL (ref 80.0–100.0)
Monocytes Absolute: 0.6 K/uL (ref 0.1–1.0)
Monocytes Relative: 12 %
Neutro Abs: 2.7 K/uL (ref 1.7–7.7)
Neutrophils Relative %: 52 %
Platelet Count: 236 K/uL (ref 150–400)
RBC: 3.94 MIL/uL — ABNORMAL LOW (ref 4.22–5.81)
RDW: 19.6 % — ABNORMAL HIGH (ref 11.5–15.5)
WBC Count: 5.1 K/uL (ref 4.0–10.5)
nRBC: 0 % (ref 0.0–0.2)

## 2024-06-23 LAB — CMP (CANCER CENTER ONLY)
ALT: 8 U/L (ref 0–44)
AST: 15 U/L (ref 15–41)
Albumin: 3.7 g/dL (ref 3.5–5.0)
Alkaline Phosphatase: 21 U/L — ABNORMAL LOW (ref 38–126)
Anion gap: 4 — ABNORMAL LOW (ref 5–15)
BUN: 14 mg/dL (ref 8–23)
CO2: 31 mmol/L (ref 22–32)
Calcium: 9 mg/dL (ref 8.9–10.3)
Chloride: 109 mmol/L (ref 98–111)
Creatinine: 0.89 mg/dL (ref 0.61–1.24)
GFR, Estimated: >60 mL/min (ref >60)
Glucose, Bld: 99 mg/dL (ref 70–99)
Potassium: 4.2 mmol/L (ref 3.5–5.1)
Sodium: 144 mmol/L (ref 135–145)
Total Bilirubin: 0.9 mg/dL (ref 0.0–1.2)
Total Protein: 6.6 g/dL (ref 6.5–8.1)

## 2024-06-23 LAB — IRON AND IRON BINDING CAPACITY (CC-WL,HP ONLY)
Iron: 171 ug/dL (ref 45–182)
Saturation Ratios: 54 % — ABNORMAL HIGH (ref 17.9–39.5)
TIBC: 319 ug/dL (ref 250–450)
UIBC: 148 ug/dL (ref 117–376)

## 2024-06-23 LAB — FERRITIN: Ferritin: 229 ng/mL (ref 24–336)

## 2024-06-23 MED ORDER — CYANOCOBALAMIN 1000 MCG/ML IJ SOLN
1000.0000 ug | Freq: Once | INTRAMUSCULAR | Status: AC
  Administered 2024-06-23: 1000 ug via INTRAMUSCULAR

## 2024-06-23 NOTE — Progress Notes (Signed)
 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 -He had recurrent chest in liver in late 2012 with further surgery by Dr. Claryce in [facility].  Gleevec  restarted at low-dose 200 mg daily, and he has been on it since then. -He had stereotactic radiosurgery to the liver lesion at [facility] with a total of 5000 cGray in 5 fractions and completed in May 2016. -Last liver MRI with and without contrast in 03/2023 at [facility] showed similar posttreatment changes in segment 7 without evidence of progression, no new lesions. -He follows Dr. Marci at [facility], but requested to transfer his care to us  in 02/2024 -pt declined ctDNA test and wants to continue imatinib    Assessment & Plan Gastrointestinal stromal tumor of stomach, post-gastrectomy, on imatinib  No signs of residual disease on the last scan in June. Blood counts are well-managed with hemoglobin improved to 12 from 9.6 in May. He prefers to continue on low-dose imatinib  (Clove). - Continue low-dose imatinib  (Clove) - Plan for MRI of abdomen and pelvis annually - Plan for CT scan of the chest biennially  Vitamin B12 deficiency due to gastrectomy B12 deficiency due to gastrectomy. He has been on weekly B12 injections and reports increased energy levels. - Continue B12 injections weekly  until eight weeks are completed, then switch to monthly injections - Check B12 levels in three months  Iron  deficiency anemia Mild iron  deficiency anemia. He received an iron  infusion about a month and a half ago. Iron  studies are pending. - Check iron  levels in three months - Consider further iron  infusions based on iron  levels  Peripheral neuropathy of feet Peripheral neuropathy in the feet causing sleep disturbances. Currently managed with gabapentin, which causes grogginess in the morning. - Consider over-the-counter sleep aids like melatonin or diphenhydramine   Recurrent folliculitis and skin nodules Recurrent folliculitis with small boils in the armpits and a spot on the skin. He has been using topical antibiotics and warm compresses, which have helped reduce the size of the boils. - Continue using warm compresses to aid drainage of boils - Consider seeing a dermatologist if skin issues worsen or if there is bleeding  Plan - He is overall doing better with B12 injections and IV iron , he has been on weekly B12 injection for 6 weeks, okay to switch to monthly after 2 more injections - Lab reviewed, anemia improved, the rest of her lab are still pending. - Continue imatinib  200 mg daily, plan to repeat abdominal MRI in June 2026 - Labs CBC, iron  study and B12 level with his PCP in 3 months, lab and follow-up with me in 6 months    Discussed the use of AI scribe software for clinical note transcription with the patient, who gave verbal consent to proceed.  History of Present Illness Henry [name] is a 73 year old male  with myelodysplastic syndrome who presents for follow-up.  He experiences neuropathy in his feet, managed with gabapentin, which causes morning grogginess. He continues on a low dose of Clove. His hemoglobin has improved from 9.6 in May to 12 currently. Iron  studies are pending, and he has restarted B12 injections due to previously low levels. He has been  receiving weekly B12 injections since June, with the last one administered on Monday, and feels more energetic since starting the injections. He has mild iron  deficiency anemia and received an iron  infusion about a month and a half ago. He is considering oral iron  supplements. He mentions skin issues, including a small puffy spot and boils in his armpits, treated with topical antibiotics and warm compresses, which have reduced in size over two weeks.     All other systems were reviewed with the patient and are negative.  MEDICAL HISTORY:  Past Medical History:  Diagnosis Date   Anemia 2008   RESOLVED  WITH IV IRON  DEXTRAN AS HE WAS UNABLE  TO ABSORB  ORAL IRON .     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 Cornett, MD;  Location: [facility];  Service: General;  Laterality: Left;   BILROTH II PROCEDURE  12-31-2005   CHOLECYSTECTOMY  12-31-2005   DONE WITH GASTRECTOMY BY DR. DEBBY CORNETT   PARTIAL NEPHRECTOMY  NOVEMBER 2008   RIGHT SIDE/ FOR T1a PAPILLARY RENAL CELL CARCINOMA   RE-EXCISION OF BREAST LUMPECTOMY Left 10/14/2013   Procedure: RE-EXCISION OF BREAST LUMPECTOMY;  Surgeon: Debby A. Cornett, MD;  Location: [facility];  Service: General;  Laterality: Left;   TUMOR REMOVAL  MARCH 2011   FOR RECURRENT GIST BY DR. LEVINE AT [facility]    I have reviewed the social history and family history with the  patient and they are unchanged from previous note.  ALLERGIES:  is allergic to feraheme  [ferumoxytol ].  MEDICATIONS:  Current Outpatient Medications  Medication Sig Dispense Refill   cyanocobalamin  (VITAMIN B12) 1000 MCG/ML injection Inject 1 mL (1,000 mcg total) into the muscle once a week. 1 mL 4   ferrous sulfate 325 (65 FE) MG tablet Take 325 mg by mouth daily.     imatinib  (GLEEVEC ) 100 MG tablet TAKE 2 TABLETS BY MOUTH DAILY. TAKE WITH MEALS AND LARGE GLASS OF WATER (CAUTION:CHEMO) 60 tablet 5   Iron -Vitamin C (VITRON-C PO) Take by mouth.     potassium chloride  SA (KLOR-CON  M) 20 MEQ tablet TAKE 1 TABLET BY MOUTH 2 TIMES DAILY. CHANGE TO ONE TABLET DAILY AFTER ONE WEEK UNTIL COMPLETE THE BOTTLE 117 tablet 1   vitamin B-12 (CYANOCOBALAMIN ) 100 MCG tablet Take 1 tablet (100 mcg total) by mouth daily. 90 tablet 6   VITAMIN D PO Take by mouth. 5,000 3 x a week     No current facility-administered medications for this visit.    PHYSICAL EXAMINATION: ECOG PERFORMANCE STATUS: 0 -  Asymptomatic  Vitals:   06/23/24 1017  BP: 126/64  Pulse: 71  Resp: 17  Temp: 98.8 F (37.1 C)  SpO2: 96%   Wt Readings from Last 3 Encounters:  06/23/24 123 lb 8 oz (56 kg)  04/27/24 123 lb 3.2 oz (55.9 kg)  04/22/24 124 lb 6.4 oz (56.4 kg)     GENERAL:alert, no distress and comfortable SKIN: skin color, texture, turgor are normal, no rashes or significant lesions EYES: normal, Conjunctiva are pink and non-injected, sclera clear NECK: supple, thyroid normal size, non-tender, without nodularity LYMPH:  no palpable lymphadenopathy in the cervical, axillary  LUNGS: clear to auscultation and percussion with normal breathing effort HEART: regular rate & rhythm and no murmurs and no lower extremity edema ABDOMEN:abdomen soft, non-tender and normal bowel sounds Musculoskeletal:no cyanosis of digits and no clubbing  NEURO: alert & oriented x 3 with fluent speech, no focal motor/sensory  deficits  Physical Exam SKIN: Skin nodule present, no lymphadenopathy.  LABORATORY DATA:  I have reviewed the data as listed    Latest Ref Rng & Units 06/23/2024    9:56 AM 03/11/2024    1:17 PM 01/10/2023    2:22 PM  CBC  WBC 4.0 - 10.5 K/uL 5.1  4.7  5.1   Hemoglobin 13.0 - 17.0 g/dL 87.9  9.6  88.0   Hematocrit 39.0 - 52.0 % 36.5  29.6  35.2   Platelets 150 - 400 K/uL 236  317  290         Latest Ref Rng & Units 06/23/2024    9:56 AM 03/11/2024    1:17 PM 01/10/2023    2:22 PM  CMP  Glucose 70 - 99 mg/dL 99  861  896   BUN 8 - 23 mg/dL 14  12  10    Creatinine 0.61 - 1.24 mg/dL 9.10  9.04  8.95   Sodium 135 - 145 mmol/L 144  144  143   Potassium 3.5 - 5.1 mmol/L 4.2  3.1  3.3   Chloride 98 - 111 mmol/L 109  111  107   CO2 22 - 32 mmol/L 31  29  31    Calcium 8.9 - 10.3 mg/dL 9.0  8.4  8.6   Total Protein 6.5 - 8.1 g/dL 6.6  6.6  6.5   Total Bilirubin 0.0 - 1.2 mg/dL 0.9  0.7  1.1   Alkaline Phos 38 - 126 U/L 21  21  28    AST 15 - 41 U/L 15  17  16    ALT 0 - 44 U/L 8  9  8        RADIOGRAPHIC STUDIES: I have personally reviewed the radiological images as listed and agreed with the findings in the report. No results found.    Orders Placed This Encounter  Procedures   Vitamin B12    Standing Status:   Standing    Number of Occurrences:   5    Expiration Date:   06/23/2025   All questions were answered. The patient knows to call the clinic with any problems, questions or concerns. No barriers to learning was detected. The total time spent in the appointment was 25 minutes, including review of chart and various tests results, discussions about plan of care and coordination of care plan     Onita Mattock, MD 06/23/2024

## 2024-07-01 ENCOUNTER — Ambulatory Visit: Payer: Self-pay | Admitting: Nurse Practitioner

## 2024-07-10 ENCOUNTER — Other Ambulatory Visit: Payer: Self-pay

## 2024-07-13 ENCOUNTER — Other Ambulatory Visit: Payer: Self-pay

## 2024-07-15 ENCOUNTER — Other Ambulatory Visit (HOSPITAL_COMMUNITY): Payer: Self-pay

## 2024-08-30 ENCOUNTER — Ambulatory Visit (HOSPITAL_COMMUNITY)
Admission: RE | Admit: 2024-08-30 | Discharge: 2024-08-30 | Disposition: A | Source: Ambulatory Visit | Attending: Hematology

## 2024-08-30 DIAGNOSIS — C49A2 Gastrointestinal stromal tumor of stomach: Secondary | ICD-10-CM | POA: Diagnosis present

## 2024-08-30 MED ORDER — GADOBUTROL 1 MMOL/ML IV SOLN
6.0000 mL | Freq: Once | INTRAVENOUS | Status: AC | PRN
  Administered 2024-08-30: 6 mL via INTRAVENOUS

## 2024-09-10 ENCOUNTER — Inpatient Hospital Stay: Attending: Hematology

## 2024-09-10 ENCOUNTER — Other Ambulatory Visit: Payer: Self-pay

## 2024-09-10 ENCOUNTER — Inpatient Hospital Stay: Admitting: Hematology

## 2024-09-10 VITALS — BP 142/75 | HR 65 | Temp 98.5°F | Resp 16 | Ht 72.0 in | Wt 122.8 lb

## 2024-09-10 DIAGNOSIS — C49A2 Gastrointestinal stromal tumor of stomach: Secondary | ICD-10-CM

## 2024-09-10 DIAGNOSIS — G629 Polyneuropathy, unspecified: Secondary | ICD-10-CM | POA: Diagnosis not present

## 2024-09-10 DIAGNOSIS — C9210 Chronic myeloid leukemia, BCR/ABL-positive, not having achieved remission: Secondary | ICD-10-CM | POA: Diagnosis not present

## 2024-09-10 DIAGNOSIS — D649 Anemia, unspecified: Secondary | ICD-10-CM | POA: Insufficient documentation

## 2024-09-10 DIAGNOSIS — E538 Deficiency of other specified B group vitamins: Secondary | ICD-10-CM | POA: Diagnosis not present

## 2024-09-10 DIAGNOSIS — D500 Iron deficiency anemia secondary to blood loss (chronic): Secondary | ICD-10-CM

## 2024-09-10 DIAGNOSIS — D509 Iron deficiency anemia, unspecified: Secondary | ICD-10-CM

## 2024-09-10 LAB — CMP (CANCER CENTER ONLY)
ALT: 15 U/L (ref 0–44)
AST: 29 U/L (ref 15–41)
Albumin: 4.2 g/dL (ref 3.5–5.0)
Alkaline Phosphatase: 25 U/L — ABNORMAL LOW (ref 38–126)
Anion gap: 10 (ref 5–15)
BUN: 15 mg/dL (ref 8–23)
CO2: 28 mmol/L (ref 22–32)
Calcium: 9.2 mg/dL (ref 8.9–10.3)
Chloride: 105 mmol/L (ref 98–111)
Creatinine: 0.9 mg/dL (ref 0.61–1.24)
GFR, Estimated: >60 mL/min (ref >60)
Glucose, Bld: 117 mg/dL — ABNORMAL HIGH (ref 70–99)
Potassium: 4.5 mmol/L (ref 3.5–5.1)
Sodium: 143 mmol/L (ref 135–145)
Total Bilirubin: 1 mg/dL (ref 0.0–1.2)
Total Protein: 6.9 g/dL (ref 6.5–8.1)

## 2024-09-10 LAB — IRON AND IRON BINDING CAPACITY (CC-WL,HP ONLY)
Iron: 136 ug/dL (ref 45–182)
Saturation Ratios: 38 % (ref 17.9–39.5)
TIBC: 357 ug/dL (ref 250–450)
UIBC: 221 ug/dL

## 2024-09-10 LAB — CBC WITH DIFFERENTIAL (CANCER CENTER ONLY)
Abs Immature Granulocytes: 0.01 K/uL (ref 0.00–0.07)
Basophils Absolute: 0.1 K/uL (ref 0.0–0.1)
Basophils Relative: 1 %
Eosinophils Absolute: 0.2 K/uL (ref 0.0–0.5)
Eosinophils Relative: 3 %
HCT: 37.1 % — ABNORMAL LOW (ref 39.0–52.0)
Hemoglobin: 12.3 g/dL — ABNORMAL LOW (ref 13.0–17.0)
Immature Granulocytes: 0 %
Lymphocytes Relative: 37 %
Lymphs Abs: 2 K/uL (ref 0.7–4.0)
MCH: 32.4 pg (ref 26.0–34.0)
MCHC: 33.2 g/dL (ref 30.0–36.0)
MCV: 97.6 fL (ref 80.0–100.0)
Monocytes Absolute: 0.7 K/uL (ref 0.1–1.0)
Monocytes Relative: 14 %
Neutro Abs: 2.4 K/uL (ref 1.7–7.7)
Neutrophils Relative %: 45 %
Platelet Count: 244 K/uL (ref 150–400)
RBC: 3.8 MIL/uL — ABNORMAL LOW (ref 4.22–5.81)
RDW: 14.6 % (ref 11.5–15.5)
WBC Count: 5.3 K/uL (ref 4.0–10.5)
nRBC: 0 % (ref 0.0–0.2)

## 2024-09-10 LAB — FERRITIN: Ferritin: 164 ng/mL (ref 24–336)

## 2024-09-10 LAB — VITAMIN B12: Vitamin B-12: 613 pg/mL (ref 180–914)

## 2024-09-10 MED ORDER — IMATINIB MESYLATE 100 MG PO TABS
ORAL_TABLET | ORAL | 5 refills | Status: AC
  Filled 2024-09-10: qty 60, 30d supply, fill #0
  Filled 2024-10-13 – 2024-10-21 (×2): qty 60, 30d supply, fill #1
  Filled 2024-11-12 – 2024-11-17 (×5): qty 60, 30d supply, fill #2

## 2024-09-10 NOTE — Progress Notes (Signed)
 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 -He had recurrent chest in liver in late 2012 with further surgery by Dr. Claryce in [facility].  Gleevec  restarted at low-dose 200 mg daily, and he has been on it since then. -He had stereotactic radiosurgery to the liver lesion at [facility] with a total of 5000 cGray in 5 fractions and completed in May 2016. -Last liver MRI with and without contrast in 03/2023 at [facility] showed similar posttreatment changes in segment 7 without evidence of progression, no new lesions. -He follows Dr. Marci at [facility], but requested to transfer his care to us  in 02/2024 -pt declined ctDNA test and wants to continue imatinib    Assessment & Plan Gastrointestinal stromal tumor of stomach Gastrointestinal stromal tumor of the stomach, well-managed on imatinib  200 mg daily. No new gastrointestinal symptoms. Energy levels are good. Last scan in June 2025 showed stable disease. Annual scans are sufficient given current stability. - Continue imatinib  200 mg daily. - Sent refill for imatinib  100 mg tablets, 60 tablets with 5 refills. - Will schedule next CT scan for summer 2026.  Peripheral neuropathy Feet, slightly worsening but manageable. Exercise helps maintain activity levels. - Encouraged regular exercise to manage symptoms.  Vitamin B12 deficiency Managed with  monthly B12 injections. Hemoglobin improved from 12.3 to 12.8, indicating effective treatment. B12 prescription needs adjustment from weekly to monthly. - Continue monthly B12 injections. - Instructed to adjust B12 prescription to monthly dosing. - Continue iron  supplements as needed.  Plan - He is clinically doing well. - Lab reviewed, anemia improved, he will continue B12 injection monthly at his PCPs office. - Continue imatinib  200 mg daily - Will transfer his care to local oncologist in [place]    Discussed the use of AI scribe software for clinical note transcription with the patient, who gave verbal consent to proceed.  History of Present Illness Henry [name] is a 73 year old male with chronic myeloid leukemia who presents for a follow-up visit.  He receives monthly B12 injections. He continues imatinib  at 200 mg daily but did not receive his prescription last month due to a mailing issue. He experiences worsening neuropathy in his feet, which he manages with activity and exercise. He has intermittent skin nodules on his body and face but is not currently concerned about them. He lives 150 miles from the clinic and is considering a publishing rights manager due to changes in his Medicare Advantage plan and travel distance.     All other systems were reviewed with the patient and are negative.  MEDICAL HISTORY:  Past Medical History:  Diagnosis Date   Anemia 2008   RESOLVED  WITH IV IRON  DEXTRAN AS HE WAS UNABLE  TO ABSORB  ORAL IRON .     B12 nutritional deficiency    RELATED TO PREVIOUS SURGRIES/ ON MONTHLY SUPPLEMENT   Breast cancer (HCC)    Cancer (HCC)  NOVEMBER 2008   T1a PAPILLARY RENAL CELL CARCINOMA   Cataract    Depression NOTED IN NOTE 05-28-11   HISTORY OF MANIC  DEPRESSIVE TYPE DISORDER   Pneumothorax, spontaneous, tension 1992   TREATED WITH A CHEST TUBE   Stomach cancer (HCC) 12/31/2005   GASTROINTESTINAL STROMAL TUMOR (GIST)    SURGICAL  HISTORY: Past Surgical History:  Procedure Laterality Date   AXILLARY SENTINEL NODE BIOPSY Left 10/14/2013   Procedure: sentinel lymph node mapping ;  Surgeon: Debby A. Cornett, MD;  Location: [facility];  Service: General;  Laterality: Left;   BILROTH II PROCEDURE  12-31-2005   CHOLECYSTECTOMY  12-31-2005   DONE WITH GASTRECTOMY BY DR. DEBBY CORNETT   PARTIAL NEPHRECTOMY  NOVEMBER 2008   RIGHT SIDE/ FOR T1a PAPILLARY RENAL CELL CARCINOMA   RE-EXCISION OF BREAST LUMPECTOMY Left 10/14/2013   Procedure: RE-EXCISION OF BREAST LUMPECTOMY;  Surgeon: Debby A. Cornett, MD;  Location: [facility];  Service: General;  Laterality: Left;   TUMOR REMOVAL  MARCH 2011   FOR RECURRENT GIST BY DR. LEVINE AT [facility]    I have reviewed the social history and family history with the patient and they are unchanged from previous note.  ALLERGIES:  is allergic to feraheme  [ferumoxytol ].  MEDICATIONS:  Current Outpatient Medications  Medication Sig Dispense Refill   cyanocobalamin  (VITAMIN B12) 1000 MCG/ML injection Inject 1 mL (1,000 mcg total) into the muscle once a week. 1 mL 4   ferrous sulfate 325 (65 FE) MG tablet Take 325 mg by mouth daily.     imatinib  (GLEEVEC ) 100 MG tablet TAKE 2 TABLETS BY MOUTH DAILY. TAKE WITH MEALS AND LARGE GLASS OF WATER (CAUTION:CHEMO) 60 tablet 5   Iron -Vitamin C (VITRON-C PO) Take by mouth.     potassium chloride  SA (KLOR-CON  M) 20 MEQ tablet TAKE 1 TABLET BY MOUTH 2 TIMES DAILY. CHANGE TO ONE TABLET DAILY AFTER ONE WEEK UNTIL COMPLETE THE BOTTLE 117 tablet 1   vitamin B-12 (CYANOCOBALAMIN ) 100 MCG tablet Take 1 tablet (100 mcg total) by mouth daily. 90 tablet 6   VITAMIN D PO Take by mouth. 5,000 3 x a week     No current facility-administered medications for this visit.    PHYSICAL EXAMINATION: ECOG PERFORMANCE STATUS: 0 - Asymptomatic  Vitals:   09/10/24 1300 09/10/24 1353  BP: (!) 141/79 (!) 142/75  Pulse: 68 65  Resp:  16   Temp: 98.5 F (36.9 C)   SpO2: 97% 98%   Wt Readings from Last 3 Encounters:  09/10/24 122 lb 12.8 oz (55.7 kg)  06/23/24 123 lb 8 oz (56 kg)  04/27/24 123 lb 3.2 oz (55.9 kg)     GENERAL:alert, no distress and comfortable SKIN: skin color, texture, turgor are normal, no rashes or significant lesions EYES: normal, Conjunctiva are pink and non-injected, sclera clear Musculoskeletal:no cyanosis of digits and no clubbing  NEURO: alert & oriented x 3 with fluent speech, no focal motor/sensory deficits  Physical Exam    LABORATORY DATA:  I have reviewed the data as listed    Latest Ref Rng & Units 09/10/2024    1:08 PM 06/23/2024    9:56 AM 03/11/2024    1:17 PM  CBC  WBC 4.0 - 10.5 K/uL 5.3  5.1  4.7   Hemoglobin 13.0 - 17.0 g/dL 87.6  87.9  9.6   Hematocrit 39.0 - 52.0 % 37.1  36.5  29.6   Platelets 150 - 400 K/uL 244  236  317         Latest Ref Rng & Units 09/10/2024    1:08 PM 06/23/2024    9:56 AM 03/11/2024    1:17 PM  CMP  Glucose 70 - 99 mg/dL 882  99  861   BUN 8 - 23 mg/dL 15  14  12    Creatinine 0.61 - 1.24 mg/dL 9.09  9.10  9.04   Sodium 135 - 145 mmol/L 143  144  144   Potassium 3.5 - 5.1 mmol/L 4.5  4.2  3.1   Chloride 98 - 111 mmol/L 105  109  111   CO2 22 - 32 mmol/L 28  31  29    Calcium 8.9 - 10.3 mg/dL 9.2  9.0  8.4   Total Protein 6.5 - 8.1 g/dL 6.9  6.6  6.6   Total Bilirubin 0.0 - 1.2 mg/dL 1.0  0.9  0.7   Alkaline Phos 38 - 126 U/L 25  21  21    AST 15 - 41 U/L 29  15  17    ALT 0 - 44 U/L 15  8  9        RADIOGRAPHIC STUDIES: I have personally reviewed the radiological images as listed and agreed with the findings in the report. No results found.    No orders of the defined types were placed in this encounter.  All questions were answered. The patient knows to call the clinic with any problems, questions or concerns. No barriers to learning was detected. The total time spent in the appointment was 15 minutes, including review of chart  and various tests results, discussions about plan of care and coordination of care plan     Onita Mattock, MD 09/10/2024

## 2024-09-10 NOTE — Progress Notes (Signed)
 Specialty Pharmacy Refill Coordination Note  Henry [name] is a 73 y.o. male contacted today regarding refills of specialty medication(s) Imatinib  Mesylate (GLEEVEC )   Patient requested Delivery   Delivery date: 09/21/24   Verified address: [address] [place], [address]   Medication will be filled on: 09/18/24

## 2024-09-10 NOTE — Assessment & Plan Note (Signed)
 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 -He had recurrent chest in liver in late 2012 with further surgery by Dr. Arline Laity in [facility].  Gleevec  restarted at low-dose 200 mg daily, and he has been on it since then. -He had stereotactic radiosurgery to the liver lesion at [facility] with a total of 5000 cGray in 5 fractions and completed in May 2016. -Last liver MRI with and without contrast in 03/2023 at [facility] showed similar posttreatment changes in segment 7 without evidence of progression, no new lesions. -He follows Dr. Francisco Irving at [facility], but requested to transfer his care to us  in 02/2024 -pt declined ctDNA test and wants to continue imatinib 

## 2024-09-18 ENCOUNTER — Other Ambulatory Visit (HOSPITAL_COMMUNITY): Payer: Self-pay

## 2024-09-18 ENCOUNTER — Other Ambulatory Visit: Payer: Self-pay

## 2024-09-18 DIAGNOSIS — C50422 Malignant neoplasm of upper-outer quadrant of left male breast: Secondary | ICD-10-CM

## 2024-09-18 DIAGNOSIS — C49A2 Gastrointestinal stromal tumor of stomach: Secondary | ICD-10-CM

## 2024-09-18 NOTE — Progress Notes (Signed)
 Faxed referral for GI Oncology to New pt referral coordinator with [facility] ([phone])  Referral packet faxed and fax confirmation received.

## 2024-09-21 ENCOUNTER — Other Ambulatory Visit (HOSPITAL_COMMUNITY): Payer: Self-pay

## 2024-09-21 ENCOUNTER — Other Ambulatory Visit: Payer: Self-pay

## 2024-10-06 ENCOUNTER — Other Ambulatory Visit: Payer: Self-pay

## 2024-10-06 NOTE — Progress Notes (Signed)
 Specialty Pharmacy Ongoing Clinical Assessment Note  Henry [name] is a 73 y.o. male who is being followed by the specialty pharmacy service for RxSp Oncology   Patient's specialty medication(s) reviewed today: Imatinib  Mesylate (GLEEVEC )   Missed doses in the last 4 weeks: 1   Patient/Caregiver did not have any additional questions or concerns.   Therapeutic benefit summary: Patient is achieving benefit   Adverse events/side effects summary: Experienced adverse events/side effects (neuropathy, tolerable at this time, unchanged)   Patient's therapy is appropriate to: Continue    Goals Addressed             This Visit's Progress    Slow Disease Progression   On track    Patient is on track. Patient will maintain adherence.  June 2025 showed stable disease per Dr. Lanny.          Follow up: 6 months  [name] Specialty Pharmacist

## 2024-10-13 ENCOUNTER — Other Ambulatory Visit: Payer: Self-pay

## 2024-10-19 ENCOUNTER — Other Ambulatory Visit (HOSPITAL_COMMUNITY): Payer: Self-pay

## 2024-10-21 ENCOUNTER — Other Ambulatory Visit: Payer: Self-pay

## 2024-10-21 NOTE — Progress Notes (Signed)
 Specialty Pharmacy Refill Coordination Note  Henry [name] is a 73 y.o. male contacted today regarding refills of specialty medication(s) Imatinib  Mesylate (GLEEVEC )   Patient requested Delivery   Delivery date: 10/26/24   Verified address: [address], [place], [address]   Medication will be filled on: 10/23/24

## 2024-10-23 ENCOUNTER — Other Ambulatory Visit: Payer: Self-pay

## 2024-11-12 ENCOUNTER — Other Ambulatory Visit: Payer: Self-pay

## 2024-11-13 ENCOUNTER — Other Ambulatory Visit: Payer: Self-pay

## 2024-11-13 ENCOUNTER — Other Ambulatory Visit: Payer: Self-pay | Admitting: Pharmacy Technician

## 2024-11-16 ENCOUNTER — Other Ambulatory Visit: Payer: Self-pay

## 2024-11-16 ENCOUNTER — Other Ambulatory Visit (HOSPITAL_COMMUNITY): Payer: Self-pay

## 2024-11-17 ENCOUNTER — Other Ambulatory Visit: Payer: Self-pay

## 2024-11-17 NOTE — Progress Notes (Signed)
 Specialty Pharmacy Refill Coordination Note  Henry [name] is a 74 y.o. male contacted today regarding refills of specialty medication(s) Imatinib  Mesylate (GLEEVEC )   Patient requested Delivery   Delivery date: 11/23/24   Verified address: [address], [place], [address]   Medication will be filled on: 11/20/24

## 2024-11-18 ENCOUNTER — Other Ambulatory Visit: Payer: Self-pay

## 2024-11-18 NOTE — Progress Notes (Signed)
 Patient was contacted by the pharmacy regarding their specialty medication(s) Imatinib  Mesylate (GLEEVEC )  to reschedule later delivery date, due to impending winter weather conditions. Medication(s) will be filled 11/26/24 for a delivery by 11/27/24

## 2024-11-26 ENCOUNTER — Other Ambulatory Visit: Payer: Self-pay

## 2024-12-22 ENCOUNTER — Ambulatory Visit: Admitting: Hematology

## 2024-12-22 ENCOUNTER — Other Ambulatory Visit
